# Patient Record
Sex: Female | Born: 1940 | Race: Black or African American | Hispanic: No | Smoking: Former smoker
Health system: Southern US, Community
[De-identification: ages and names within clinical notes are randomized; demographics above are authoritative.]

## PROBLEM LIST (undated history)

## (undated) DIAGNOSIS — F419 Anxiety disorder, unspecified: Secondary | ICD-10-CM

## (undated) DIAGNOSIS — H409 Unspecified glaucoma: Secondary | ICD-10-CM

## (undated) DIAGNOSIS — I272 Pulmonary hypertension, unspecified: Secondary | ICD-10-CM

## (undated) DIAGNOSIS — I270 Primary pulmonary hypertension: Secondary | ICD-10-CM

## (undated) DIAGNOSIS — E785 Hyperlipidemia, unspecified: Secondary | ICD-10-CM

## (undated) DIAGNOSIS — I499 Cardiac arrhythmia, unspecified: Secondary | ICD-10-CM

## (undated) DIAGNOSIS — M349 Systemic sclerosis, unspecified: Secondary | ICD-10-CM

## (undated) DIAGNOSIS — I10 Essential (primary) hypertension: Secondary | ICD-10-CM

## (undated) HISTORY — DX: Unspecified glaucoma: H40.9

## (undated) HISTORY — DX: Hyperlipidemia, unspecified: E78.5

## (undated) HISTORY — PX: VESICOVAGINAL FISTULA CLOSURE W/ TAH: SUR271

## (undated) HISTORY — PX: COLONOSCOPY: SHX174

## (undated) HISTORY — DX: Cardiac arrhythmia, unspecified: I49.9

## (undated) HISTORY — DX: Anxiety disorder, unspecified: F41.9

## (undated) HISTORY — DX: Systemic sclerosis, unspecified: M34.9

## (undated) HISTORY — DX: Pulmonary hypertension, unspecified: I27.20

## (undated) HISTORY — DX: Primary pulmonary hypertension: I27.0

---

## 2000-03-21 ENCOUNTER — Inpatient Hospital Stay (HOSPITAL_COMMUNITY): Admission: EM | Admit: 2000-03-21 | Discharge: 2000-03-23 | Payer: Self-pay | Admitting: Emergency Medicine

## 2000-03-22 ENCOUNTER — Encounter: Payer: Self-pay | Admitting: Geriatric Medicine

## 2000-06-19 ENCOUNTER — Emergency Department (HOSPITAL_COMMUNITY): Admission: EM | Admit: 2000-06-19 | Discharge: 2000-06-20 | Payer: Self-pay | Admitting: *Deleted

## 2000-06-19 ENCOUNTER — Encounter: Payer: Self-pay | Admitting: Emergency Medicine

## 2002-09-08 ENCOUNTER — Encounter (INDEPENDENT_AMBULATORY_CARE_PROVIDER_SITE_OTHER): Payer: Self-pay | Admitting: *Deleted

## 2002-09-08 ENCOUNTER — Ambulatory Visit (HOSPITAL_COMMUNITY): Admission: RE | Admit: 2002-09-08 | Discharge: 2002-09-08 | Payer: Self-pay | Admitting: Gastroenterology

## 2006-02-27 ENCOUNTER — Encounter: Admission: RE | Admit: 2006-02-27 | Discharge: 2006-02-27 | Payer: Self-pay | Admitting: Internal Medicine

## 2006-03-11 ENCOUNTER — Encounter: Admission: RE | Admit: 2006-03-11 | Discharge: 2006-03-11 | Payer: Self-pay | Admitting: Internal Medicine

## 2006-09-14 ENCOUNTER — Ambulatory Visit (HOSPITAL_COMMUNITY): Admission: RE | Admit: 2006-09-14 | Discharge: 2006-09-14 | Payer: Self-pay | Admitting: Internal Medicine

## 2008-11-24 ENCOUNTER — Observation Stay (HOSPITAL_COMMUNITY): Admission: EM | Admit: 2008-11-24 | Discharge: 2008-11-25 | Payer: Self-pay | Admitting: Emergency Medicine

## 2008-12-30 ENCOUNTER — Emergency Department (HOSPITAL_COMMUNITY): Admission: EM | Admit: 2008-12-30 | Discharge: 2008-12-30 | Payer: Self-pay | Admitting: Emergency Medicine

## 2010-07-04 LAB — COMPREHENSIVE METABOLIC PANEL
ALT: 11 U/L (ref 0–35)
AST: 28 U/L (ref 0–37)
Albumin: 4.1 g/dL (ref 3.5–5.2)
Alkaline Phosphatase: 54 U/L (ref 39–117)
BUN: 11 mg/dL (ref 6–23)
CO2: 24 mEq/L (ref 19–32)
Calcium: 9.4 mg/dL (ref 8.4–10.5)
Chloride: 105 mEq/L (ref 96–112)
Creatinine, Ser: 0.63 mg/dL (ref 0.4–1.2)
GFR calc Af Amer: >60 mL/min (ref >60)
GFR calc non Af Amer: >60 mL/min (ref >60)
Glucose, Bld: 153 mg/dL — ABNORMAL HIGH (ref 70–99)
Potassium: 3.1 mEq/L — ABNORMAL LOW (ref 3.5–5.1)
Sodium: 140 mEq/L (ref 135–145)
Total Bilirubin: 1 mg/dL (ref 0.3–1.2)
Total Protein: 7.1 g/dL (ref 6.0–8.3)

## 2010-07-04 LAB — LIPASE, BLOOD: Lipase: 15 U/L (ref 11–59)

## 2010-07-04 LAB — CBC
HCT: 38.9 % (ref 36.0–46.0)
Hemoglobin: 12.8 g/dL (ref 12.0–15.0)
MCHC: 33 g/dL (ref 30.0–36.0)
MCV: 93.6 fL (ref 78.0–100.0)
Platelets: 360 10*3/uL (ref 150–400)
RBC: 4.16 MIL/uL (ref 3.87–5.11)
RDW: 15 % (ref 11.5–15.5)
WBC: 13 10*3/uL — ABNORMAL HIGH (ref 4.0–10.5)

## 2010-07-04 LAB — DIFFERENTIAL
Basophils Absolute: 0.2 10*3/uL — ABNORMAL HIGH (ref 0.0–0.1)
Basophils Relative: 1 % (ref 0–1)
Eosinophils Absolute: 0 10*3/uL (ref 0.0–0.7)
Eosinophils Relative: 0 % (ref 0–5)
Lymphocytes Relative: 11 % — ABNORMAL LOW (ref 12–46)
Lymphs Abs: 1.4 10*3/uL (ref 0.7–4.0)
Monocytes Absolute: 0.6 10*3/uL (ref 0.1–1.0)
Monocytes Relative: 4 % (ref 3–12)
Neutro Abs: 10.8 10*3/uL — ABNORMAL HIGH (ref 1.7–7.7)
Neutrophils Relative %: 83 % — ABNORMAL HIGH (ref 43–77)

## 2010-07-06 LAB — COMPREHENSIVE METABOLIC PANEL
ALT: 12 U/L (ref 0–35)
ALT: 16 U/L (ref 0–35)
AST: 18 U/L (ref 0–37)
AST: 30 U/L (ref 0–37)
Albumin: 3.1 g/dL — ABNORMAL LOW (ref 3.5–5.2)
Albumin: 4.7 g/dL (ref 3.5–5.2)
Alkaline Phosphatase: 37 U/L — ABNORMAL LOW (ref 39–117)
Alkaline Phosphatase: 59 U/L (ref 39–117)
BUN: 10 mg/dL (ref 6–23)
BUN: 32 mg/dL — ABNORMAL HIGH (ref 6–23)
CO2: 24 mEq/L (ref 19–32)
CO2: 26 mEq/L (ref 19–32)
Calcium: 10.1 mg/dL (ref 8.4–10.5)
Calcium: 8.6 mg/dL (ref 8.4–10.5)
Chloride: 108 mEq/L (ref 96–112)
Chloride: 99 mEq/L (ref 96–112)
Creatinine, Ser: 0.87 mg/dL (ref 0.4–1.2)
Creatinine, Ser: 1.48 mg/dL — ABNORMAL HIGH (ref 0.4–1.2)
GFR calc Af Amer: 43 mL/min — ABNORMAL LOW (ref >60)
GFR calc Af Amer: >60 mL/min (ref >60)
GFR calc non Af Amer: 35 mL/min — ABNORMAL LOW (ref >60)
GFR calc non Af Amer: >60 mL/min (ref >60)
Glucose, Bld: 108 mg/dL — ABNORMAL HIGH (ref 70–99)
Glucose, Bld: 82 mg/dL (ref 70–99)
Potassium: 3.1 mEq/L — ABNORMAL LOW (ref 3.5–5.1)
Potassium: 3.5 mEq/L (ref 3.5–5.1)
Sodium: 138 mEq/L (ref 135–145)
Sodium: 140 mEq/L (ref 135–145)
Total Bilirubin: 0.6 mg/dL (ref 0.3–1.2)
Total Bilirubin: 0.8 mg/dL (ref 0.3–1.2)
Total Protein: 5.7 g/dL — ABNORMAL LOW (ref 6.0–8.3)
Total Protein: 8.2 g/dL (ref 6.0–8.3)

## 2010-07-06 LAB — CBC
HCT: 32.1 % — ABNORMAL LOW (ref 36.0–46.0)
HCT: 41.9 % (ref 36.0–46.0)
Hemoglobin: 10.6 g/dL — ABNORMAL LOW (ref 12.0–15.0)
Hemoglobin: 13.8 g/dL (ref 12.0–15.0)
MCHC: 33 g/dL (ref 30.0–36.0)
MCHC: 33.2 g/dL (ref 30.0–36.0)
MCV: 92.8 fL (ref 78.0–100.0)
MCV: 93.5 fL (ref 78.0–100.0)
Platelets: 260 10*3/uL (ref 150–400)
Platelets: 334 10*3/uL (ref 150–400)
RBC: 3.44 MIL/uL — ABNORMAL LOW (ref 3.87–5.11)
RBC: 4.51 MIL/uL (ref 3.87–5.11)
RDW: 13.8 % (ref 11.5–15.5)
RDW: 14 % (ref 11.5–15.5)
WBC: 3.8 10*3/uL — ABNORMAL LOW (ref 4.0–10.5)
WBC: 5.8 10*3/uL (ref 4.0–10.5)

## 2010-07-06 LAB — DIFFERENTIAL
Basophils Absolute: 0 10*3/uL (ref 0.0–0.1)
Basophils Relative: 1 % (ref 0–1)
Eosinophils Absolute: 0 10*3/uL (ref 0.0–0.7)
Eosinophils Relative: 0 % (ref 0–5)
Lymphocytes Relative: 26 % (ref 12–46)
Lymphs Abs: 1.5 10*3/uL (ref 0.7–4.0)
Monocytes Absolute: 0.2 10*3/uL (ref 0.1–1.0)
Monocytes Relative: 4 % (ref 3–12)
Neutro Abs: 4.1 10*3/uL (ref 1.7–7.7)
Neutrophils Relative %: 70 % (ref 43–77)

## 2010-07-06 LAB — BASIC METABOLIC PANEL
BUN: 23 mg/dL (ref 6–23)
CO2: 22 mEq/L (ref 19–32)
Calcium: 8.6 mg/dL (ref 8.4–10.5)
Chloride: 104 mEq/L (ref 96–112)
Creatinine, Ser: 0.97 mg/dL (ref 0.4–1.2)
GFR calc Af Amer: >60 mL/min (ref >60)
GFR calc non Af Amer: 57 mL/min — ABNORMAL LOW (ref >60)
Glucose, Bld: 111 mg/dL — ABNORMAL HIGH (ref 70–99)
Potassium: 3.7 mEq/L (ref 3.5–5.1)
Sodium: 137 mEq/L (ref 135–145)

## 2010-07-06 LAB — URINALYSIS, ROUTINE W REFLEX MICROSCOPIC
Bilirubin Urine: NEGATIVE
Glucose, UA: NEGATIVE mg/dL
Ketones, ur: 40 mg/dL — AB
Nitrite: NEGATIVE
Protein, ur: NEGATIVE mg/dL
Specific Gravity, Urine: 1.019 (ref 1.005–1.030)
Urobilinogen, UA: 1 mg/dL (ref 0.0–1.0)
pH: 5.5 (ref 5.0–8.0)

## 2010-07-06 LAB — URINE MICROSCOPIC-ADD ON

## 2010-07-06 LAB — URINE CULTURE: Colony Count: >=100000

## 2010-07-06 LAB — LACTIC ACID, PLASMA
Lactic Acid, Venous: 0.6 mmol/L (ref 0.5–2.2)
Lactic Acid, Venous: 4.8 mmol/L — ABNORMAL HIGH (ref 0.5–2.2)

## 2010-07-06 LAB — LIPASE, BLOOD: Lipase: 22 U/L (ref 11–59)

## 2010-08-13 NOTE — H&P (Signed)
NAMEKHILYNN, BORNTREGER NO.:  [id number]   MEDICAL RECORD NO.:  [id number]          PATIENT TYPE:  EMS   LOCATION:  MAJO                         FACILITY:  [facility]   PHYSICIAN:  Jude Enid Baas, MD          DATE OF BIRTH:  05/14/1940   DATE OF ADMISSION:  11/24/2008  DATE OF DISCHARGE:                              HISTORY & PHYSICAL   PRIMARY CARE PHYSICIAN:  Merlene Laughter. Renae Gloss, MD   CHIEF COMPLAINT:  Intractable abdominal pain, nausea, and vomiting.   HISTORY OF PRESENTING COMPLAINT:  The patient is a 70 year old with  history of hypertension and diverticulitis who presented to the ED with  complaints of intractable abdominal pain, nausea, vomiting, and diarrhea  x4 days.  Described the pain as colicky and crampy in nature.  Severity  of about 9/10, down to a 6/10 at this time.  The patient has been having  associated nausea, vomiting, and diarrhea, but denies any blood in the  stool.  The patient was evaluated in the ED.  Initial workup done  included lactic acid level which was elevated at 4.8.  The patient was  placed on IV fluid rehydration and repeat lactic acid level was down to  0.6.  Abdominal x-ray done was essentially unremarkable.  However,  urinalysis done was suggestive of a urinary tract infection.  The  patient has been given pain medication with Dilaudid and still continues  to have some abdominal pain.  Being admitted for further evaluation and  management.  Lipase level was also checked which was within normal  limits.   PAST MEDICAL HISTORY:  Significant for:  1. Hypertension.  2. History of diverticulitis in the past.   ALLERGIES:  The patient has no known drug allergies.   OUTPATIENT MEDICATION:  Lisinopril, hydrochlorothiazide 1 tablet p.o.  daily, and Phenergan as needed.   FAMILY HISTORY:  The patient lives with daughter at this time.  Family  history is significant for diabetes in the mom and brother, also son  diagnosed recently with  testicular cancer.  The patient denies any  history of smoking, alcohol, or IV drug use.   REVIEW OF SYSTEMS:  GENERAL:  No fevers, weight loss, or loss of  appetite.  RESPIRATORY:  No shortness of breath or cough productive of  sputum.  CVS:  No chest pain or palpitation.  GI:  Positive for nausea,  vomiting, diarrhea, and abdominal pain.  ENDOCRINE:  No polyuria,  polydipsia, or polyphagia.  GENITOURINARY:  No dysuria, frequency, or  urgency.  HEM:  No easy skin bruising or epistaxis.  PSYCH:  No  depression.  NEURO:  No headache, seizures, or syncope.   PHYSICAL EXAMINATION:  VITAL SIGNS:  On presentation, blood pressure was  151/82, pulse rate of 67, respiratory rate of 24, temperature 98.0, and  O2 sat of 95%.  GENERAL:  Found a middle-aged African American lady, currently in mild-  to-moderate distress from abdominal pain.  HEENT:  Pupils are equal, round, and reactive to light and accommodation  bilaterally.  Extraocular muscles are intact.  NECK:  Supple.  No JVD.  No thyroid enlargement.  RESPIRATORY:  Chest is clear to auscultation bilaterally.  CVS:  Heart sounds 1 and 2.  Regular rate and rhythm.  No murmurs or  gallops.  ABDOMEN:  Flat with mild diffuse tenderness mostly in the suprapubic  region.  No rebound or guarding.  Bowel sounds positive in all  quadrants.  EXTREMITIES:  No cyanosis, clubbing, or edema.  Pedal pulses palpable  bilaterally.  NEURO:  The patient is awake, alert, and oriented to time, place, and  person.  No focal findings noted at this time.   LABORATORY DATA:  CBC with white count of 5.8, hemoglobin 13.8,  hematocrit 41.9, and platelet of 334.  BMP with sodium of 140, potassium  3.1, chloride 99, bicarb 26, BUN 32, creatinine 1.48, and glucose of  108.  Calcium 10.1.  LFTs essentially unremarkable.  Lipase 22.  Lactic  acid 4.8 initially, but down to 0.6 on repeat.  Urinalysis suggestive of  urinary tract infection.   Abdominal x-ray was  essentially unremarkable.  No free air was noted.   ASSESSMENT:  1. Urinary tract infection.  2. Intractable abdominal pain, nausea, and vomiting, unclear etiology.  3. Hypokalemia.  4. History of hypertension and diverticulitis.   PLAN:  1. We will admit to inpatient.  2. We will get CT scan of the abdomen and pelvis for further      evaluation of this persistent abdominal pain.  3. We will place on IV ceftriaxone for treatment of urinary tract      infection, pending the results of culture and sensitivity.  4. We will place on p.r.n. pain control with morphine, Zofran p.r.n.      for nausea, and IV fluid for rehydration.  5. We will consider GI consult in the morning if there is persistence      of the symptoms despite ongoing measures as stated above.  6. Prophylaxis.  The patient has been placed on Lovenox and Protonix.  7. We will hold off on outpatient dose of hydrochlorothiazide and      lisinopril due to ongoing nausea and vomiting as the patient      appears to be dehydrated at this time.  We will place on amlodipine      10 mg p.o. daily with whole parameters for blood pressure.   Case and plan discussed extensively with the patient.  She voiced  understanding and she is in agreement with the plan of care.      Jude Enid Baas, MD  Electronically Signed     JO/MEDQ  D:  11/24/2008  T:  11/24/2008  Job:  [id number]

## 2010-08-13 NOTE — Discharge Summary (Signed)
Nancy Lambert, Nancy Lambert NO.:  [id number]   MEDICAL RECORD NO.:  [id number]          PATIENT TYPE:  INP   LOCATION:  6729                         FACILITY:  [facility]   PHYSICIAN:  Eduard Clos, MDDATE OF BIRTH:  Jun 26, 1940   DATE OF ADMISSION:  11/23/2008  DATE OF DISCHARGE:  11/25/2008                               DISCHARGE SUMMARY   PRIMARY CARE PHYSICIAN:  Merlene Laughter. Renae Gloss, MD   HOSPITAL COURSE:  A 70 year old female with a history of hypertension  and previous diverticulitis presented with complaint of abdominal pain,  nausea, and vomiting.  On admission, the patient had a CAT scan of  abdomen and pelvis, which did not show any acute.  Elevated lactic acid  was found, which was subsequently repeated and was found to be 0.6,  within normal range.  UA was compatible with possible UTI at this time  and cultures are only showing multiple morphotypes more than 100,000  colonies.  The patient at this time has improved symptom wise, she is  able to tolerate diet, had an enema which had produced some results.  In  addition, the patient also had developed some swelling in the right  hand, both proximal and distal interphalangeal joints, which the patient  states that she gets recurrently.  Possible gout is entertained at this  time.  We will be giving Solu-Medrol one dose IV 40 mg and after that  the patient will be discharged home.  Due to possibility of gout her  lisinopril/hydrochlorothiazide will be discontinued at this time.  During this stay, the patient was on amlodipine, will be continued.  The  patient advised to follow up with her primary care physician within a  week's time and further workup on possible gout is to be carried on,  which the patient agrees.   The patient also was bit concerned about her son's health who was  diagnosed with cancer, had expressed feelings about it, but did not have  any suicidal ideation, and will be starting on  fluoxetine for the  patient's depression, and will be given a script for that and further  followup with the primary care for the same.  At this time of this  dictation, the patient is hemodynamically stable.   PROCEDURES DONE:  During this stay,  1. Acute abdominal series on November 24, 2008, shows unremarkable bowel      gas pattern.  No free intra-abdominal air seen.  2. CT of abdomen and pelvis on November 24, 2008, shows nothing acute.   PERTINENT LABORATORY DATA:  The patient's hemoglobin and hematocrit on  discharge was 10.6 and 32.1, WBC was 3.8.  Creatinine was 0.8, which  improved from 1.4 with hydration, lipase was 22.  UA showed 7-10 wbc and  leukocytes was positive, nitrites negative.  Urine culture shows  multiple morphotypes more than 100,000 colonies   FINAL DIAGNOSES:  1. Abdominal pain, possibly from urinary tract infection.  2. Constipation, improved with enema.  3. 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  4. Depression with no suicidal ideation.  5. Hypertension.   MEDICATIONS:  At discharge,  1. Norvasc 10 mg daily.  2. Fluoxetine 20 mg daily.   PLAN:  1. The patient advised to follow up with the primary care physician      within a week's time, recheck BMET and CBC.  At that time, also the      patient needs to check the uric acid level and further work up on      arthritis.  2. The patient also needs to have further followup on her anemia.  3. The patient also needs to follow with her primary care for      addressing her depression.  At present, has no suicidal ideation.  4. The patient is to be on a cardiac healthy diet.      Eduard Clos, MD  Electronically Signed     ANK/MEDQ  D:  11/25/2008  T:  11/26/2008  Job:  [id number]   cc:   Merlene Laughter. Renae Gloss, M.D.

## 2010-08-16 NOTE — H&P (Signed)
[facility]  Patient:    Nancy Lambert, Nancy Lambert                      MRN: [id number] Adm. Date:  [id number] Attending:  Kae Heller                         History and Physical  CHIEF COMPLAINT:  Nausea, vomiting, and diarrhea.  HISTORY OF PRESENT ILLNESS:  Nancy Lambert is a 70 year old female, who has no significant past medical history, had remotely been followed at [facility].  She is admitted as an unassigned patient for intractable nausea, vomiting, and diarrhea today.  She reports she had some symptoms with constipation the previous week, and took two laxatives last night.  She reports that several of her co-workers have had similar symptoms. The nausea and vomiting started this afternoon.  She does not report any abdominal pain at present.  She has had no blood in her stool.   She has no urinary symptoms.  REVIEW OF SYSTEMS:  She denies chest pain or palpitations.  No shortness of breath.  No chronic coughing and no musculoskeletal symptoms.  No skin rash of note.  No recent weight gain or weight loss.  No night sweats, fevers or chills.  No change in vision or hearing.  PAST MEDICAL HISTORY:  None.  PAST SURGICAL HISTORY:  Includes remote hysterectomy.  CURRENT MEDICATIONS:  None.  ALLERGIES:  None.  SOCIAL HISTORY:  The patient is divorced.  She works in Heritage manager at [employer].  She does not smoke nor drink alcohol.  FAMILY HISTORY:  Mother is alive in her 81s.  No medical problems.  Father died in his 70s from drowning.  She has five sisters and two brothers in which she does not report any significant medical problems.  PHYSICAL EXAMINATION:  VITAL SIGNS:  Blood pressure elevated at 185/89, heart rate at 63, respiratory rate 20, temperature is 97.1.  HEENT:  Tympanic membranes are normal.  Nasal mucosa and oral mucosa moist.  LUNGS:  Clear.  HEART:  Regular rate and rhythm  without murmurs, rubs, or bruits.  ABDOMEN:  Soft, nontender, increased bowel sounds.  RECTAL:  Watery brown stool.  No other abnormality.  EXTREMITIES:  No clubbing, cyanosis, or edema.  LABORATORY DATA:  Lipase is normal at 27, amylase slightly elevated in the 180s.  Basic metabolic panel normal except for a low potassium at 2.8.  CBC is normal with a hemoglobin of 12.4 and a white count of 9.0.  LFTs are within normal limits.  Plain film of the abdomen does not show any significant abnormality.  Chest x-ray within normal limits.  ASSESSMENT AND PLAN: 1. Patient with probable viral gastroenteritis with intractable nausea,    vomiting, and diarrhea.  She had an nasogastric tube placed in the    emergency department.  I will discontinue that after about four hours if    nausea is resolved.  Continue to treat her with Phenergan. 2. Hypokalemia, due to #1.  We will replete this with intravenous fluids. 3. Hopefully discharge the patient in the morning if she is feeling well    with no nausea. 4. Patient with elevated amylase, most likely secondary to acute illness.    Her lipase is within normal limits. DD:  03/21/00 TD:  03/22/00 Job: [id number]

## 2010-08-16 NOTE — Op Note (Signed)
   NAME:  EMREY, THORNLEY NO.:  [id number]   MEDICAL RECORD NO.:  [id number]                   PATIENT TYPE:  AMB   LOCATION:  ENDO                                 FACILITY:  [facility]   PHYSICIAN:  Danise Edge, M.D.                DATE OF BIRTH:  September 18, 1940   DATE OF PROCEDURE:  09/08/2002  DATE OF DISCHARGE:                                 OPERATIVE REPORT   INDICATIONS FOR PROCEDURE:  The patient is a 70 year old female born [birth year]. She is undergoing diagnostic colonoscopy to evaluate intermittent  painless hematochezia associated with constipated bowel movements.   ENDOSCOPIST:  Danise Edge, M.D.   PREMEDICATIONS:  7.5mg  Versed and 50mg  Demerol.   PROCEDURE:  After obtaining informed consent, the patient was placed in the  left lateral decubitus position. I administered intravenous  Demerol and  intravenous Versed to achieve conscious sedation for the procedure. The  patient's blood pressure, oxygen saturation and cardiac rhythm are monitored  throughout the procedure and documented in the medical record.   Anal inspection was normal. Digital rectal examination was normal. The  Olympus pediatric colonoscope was introduced into the rectum and advanced to  the cecum. Colonic preparation for the examination today was excellent.   Rectum; from the proximal rectum at 15 cm from the anal verge a 1 cm  pedunculated polyp was removed with the electrocautery snare.   The sigmoid colon and descending colon was normal.   Splenic flexure normal.   Transverse colon normal.   Hepatic flexure normal.   Ascending colon normal.   Cecum and ileocecal valve normal.   ASSESSMENT:  Proctocolonoscopy to the cecum reveals a 1 cm pedunculated  polyp in the proximal rectum which was removed with electrocautery snare.                                               Danise Edge, M.D.    MJ/MEDQ  D:  09/08/2002  T:  09/08/2002  Job:  [id number]

## 2013-01-22 ENCOUNTER — Emergency Department (HOSPITAL_BASED_OUTPATIENT_CLINIC_OR_DEPARTMENT_OTHER)
Admission: EM | Admit: 2013-01-22 | Discharge: 2013-01-22 | Disposition: A | Discharge status: Home / Self Care | Payer: No Typology Code available for payment source | Attending: Emergency Medicine | Admitting: Emergency Medicine

## 2013-01-22 ENCOUNTER — Emergency Department (HOSPITAL_BASED_OUTPATIENT_CLINIC_OR_DEPARTMENT_OTHER): Payer: No Typology Code available for payment source

## 2013-01-22 ENCOUNTER — Encounter (HOSPITAL_BASED_OUTPATIENT_CLINIC_OR_DEPARTMENT_OTHER): Payer: Self-pay | Admitting: Emergency Medicine

## 2013-01-22 DIAGNOSIS — Y9389 Activity, other specified: Secondary | ICD-10-CM | POA: Insufficient documentation

## 2013-01-22 DIAGNOSIS — Z79899 Other long term (current) drug therapy: Secondary | ICD-10-CM | POA: Insufficient documentation

## 2013-01-22 DIAGNOSIS — IMO0002 Reserved for concepts with insufficient information to code with codable children: Secondary | ICD-10-CM | POA: Insufficient documentation

## 2013-01-22 DIAGNOSIS — I10 Essential (primary) hypertension: Secondary | ICD-10-CM | POA: Insufficient documentation

## 2013-01-22 DIAGNOSIS — Y92410 Unspecified street and highway as the place of occurrence of the external cause: Secondary | ICD-10-CM | POA: Insufficient documentation

## 2013-01-22 DIAGNOSIS — S0993XA Unspecified injury of face, initial encounter: Secondary | ICD-10-CM | POA: Insufficient documentation

## 2013-01-22 HISTORY — DX: Essential (primary) hypertension: I10
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
## 2013-04-15 ENCOUNTER — Other Ambulatory Visit: Payer: Self-pay

## 2013-04-15 DIAGNOSIS — Z1231 Encounter for screening mammogram for malignant neoplasm of breast: Secondary | ICD-10-CM

## 2013-05-02 ENCOUNTER — Ambulatory Visit
Admission: RE | Admit: 2013-05-02 | Discharge: 2013-05-02 | Disposition: A | Discharge status: Home / Self Care | Payer: Medicare Other | Source: Ambulatory Visit

## 2013-05-02 DIAGNOSIS — Z1231 Encounter for screening mammogram for malignant neoplasm of breast: Secondary | ICD-10-CM

## 2014-10-23 ENCOUNTER — Other Ambulatory Visit: Payer: Self-pay

## 2014-10-23 DIAGNOSIS — Z1231 Encounter for screening mammogram for malignant neoplasm of breast: Secondary | ICD-10-CM

## 2014-10-30 ENCOUNTER — Ambulatory Visit
Admission: RE | Admit: 2014-10-30 | Discharge: 2014-10-30 | Disposition: A | Discharge status: Home / Self Care | Payer: Medicare HMO | Source: Ambulatory Visit

## 2014-10-30 DIAGNOSIS — Z1231 Encounter for screening mammogram for malignant neoplasm of breast: Secondary | ICD-10-CM

## 2015-02-05 ENCOUNTER — Other Ambulatory Visit (HOSPITAL_COMMUNITY): Payer: Self-pay | Admitting: Respiratory Therapy

## 2015-02-05 DIAGNOSIS — M791 Myalgia, unspecified site: Secondary | ICD-10-CM

## 2015-02-16 ENCOUNTER — Ambulatory Visit (HOSPITAL_COMMUNITY)
Admission: RE | Admit: 2015-02-16 | Discharge: 2015-02-16 | Disposition: A | Discharge status: Home / Self Care | Payer: Medicare HMO | Source: Ambulatory Visit | Attending: Rheumatology | Admitting: Rheumatology

## 2015-02-16 DIAGNOSIS — M791 Myalgia: Secondary | ICD-10-CM | POA: Insufficient documentation

## 2015-02-16 LAB — PULMONARY FUNCTION TEST
DL/VA % pred: 100 %
DL/VA: 4.68 ml/min/mmHg/L
DLCO unc % pred: 70 %
DLCO unc: 16.17 ml/min/mmHg
FEF 25-75 Pre: 2 L/sec
FEF2575-%Pred-Pre: 129 %
FEV1-%Pred-Pre: 101 %
FEV1-Pre: 1.72 L
FEV1FVC-%Pred-Pre: 107 %
FEV6-%Pred-Pre: 98 %
FEV6-Pre: 2.07 L
FEV6FVC-%Pred-Pre: 104 %
FVC-%Pred-Pre: 94 %
FVC-Pre: 2.07 L
Pre FEV1/FVC ratio: 83 %
Pre FEV6/FVC Ratio: 100 %
RV % pred: 73 %
RV: 1.61 L
TLC % pred: 73 %
TLC: 3.62 L

## 2015-05-10 ENCOUNTER — Other Ambulatory Visit: Payer: Self-pay | Admitting: Rheumatology

## 2015-05-10 DIAGNOSIS — M25511 Pain in right shoulder: Secondary | ICD-10-CM

## 2015-05-28 ENCOUNTER — Encounter: Payer: Self-pay | Admitting: Internal Medicine

## 2015-05-28 ENCOUNTER — Ambulatory Visit
Admission: RE | Admit: 2015-05-28 | Discharge: 2015-05-28 | Disposition: A | Discharge status: Home / Self Care | Payer: Medicare HMO | Source: Ambulatory Visit | Attending: Rheumatology | Admitting: Rheumatology

## 2015-05-28 ENCOUNTER — Ambulatory Visit (INDEPENDENT_AMBULATORY_CARE_PROVIDER_SITE_OTHER): Payer: Medicare HMO | Admitting: Internal Medicine

## 2015-05-28 VITALS — BP 122/70 | HR 60 | Ht 64.0 in | Wt 162.0 lb

## 2015-05-28 DIAGNOSIS — R0600 Dyspnea, unspecified: Secondary | ICD-10-CM | POA: Insufficient documentation

## 2015-05-28 DIAGNOSIS — M25511 Pain in right shoulder: Secondary | ICD-10-CM

## 2015-05-28 DIAGNOSIS — R0609 Other forms of dyspnea: Secondary | ICD-10-CM

## 2015-05-28 MED ORDER — GADOBENATE DIMEGLUMINE 529 MG/ML IV SOLN
14.0000 mL | Freq: Once | INTRAVENOUS | Status: AC | PRN
  Administered 2015-05-28: 14 mL via INTRAVENOUS

## 2015-05-28 NOTE — Progress Notes (Signed)
   Subjective:    Patient ID: Nancy Lambert, female    DOB: 01-13-1941,    MRN: [id number]  HPI  86 yobf mild smoking hx / quit in her 71's referred to pulmonary clinic 05/28/2015 by Dr Titus Mould with dx of limited scleroderma and cardiomegaly being eval by Dr Nadyne Coombes also     05/28/2015 1st [facility] office visit/ Nancy Lambert   Chief Complaint  Patient presents with  . Pulmonary Consult    Referred by Dr. Dossie Der. Pt c/o SOB for over 10 yrs. She gets SOB walking up stairs. She also notices SOB sometimes when lying down. She has palpitations when SOB occurs.   at home has to go up same steps x 12 years and noted 10 years ago slt reduction tolerance but never has to stop and really no progression of doe/ no problem with flat surfaces at nl pace  Sleep disrupted  by overt GERD/ on prilosec qd and nothing at hs in terms of gerd rx   No obvious   day to day or daytime variabilty or assoc chronic cough or cp or chest tightness, subjective wheeze overt sinus or hb symptoms. No unusual exp hx or h/o childhood pna/ asthma or knowledge of premature birth.   Also denies any obvious fluctuation of symptoms with weather or environmental changes or other aggravating or alleviating factors except as outlined above   Current Medications, Allergies, Complete Past Medical History, Past Surgical History, Family History, and Social History were reviewed in [employer] record.             Review of Systems  Constitutional: Negative for fever, chills and unexpected weight change.  HENT: Negative for congestion, dental problem, ear pain, nosebleeds, postnasal drip, rhinorrhea, sinus pressure, sneezing, sore throat, trouble swallowing and voice change.   Eyes: Negative for visual disturbance.  Respiratory: Positive for shortness of breath. Negative for cough and choking.   Cardiovascular: Positive for leg swelling. Negative for chest pain.  Gastrointestinal: Negative for vomiting, abdominal  pain and diarrhea.       Acid heartburn  Indigestion  Genitourinary: Negative for difficulty urinating.  Musculoskeletal: Negative for arthralgias.  Skin: Positive for rash.  Neurological: Negative for tremors, syncope and headaches.  Hematological: Does not bruise/bleed easily.       Objective:   Physical Exam  amb bf nad  HEENT: nl dentition, turbinates, and oropharynx. Nl external ear canals without cough reflex   NECK :  without JVD/Nodes/TM/ nl carotid upstrokes bilaterally   LUNGS: no acc muscle use,  Nl contour chest which is clear to A and P bilaterally without cough on insp or exp maneuvers   CV:  RRR  no s3 or murmur  ?  increase in P2, no edema   ABD:  soft and nontender with nl inspiratory excursion in the supine position. No bruits or organomegaly, bowel sounds nl  MS:  Nl gait/ ext warm without deformities, calf tenderness, cyanosis or clubbing No obvious joint restrictions   SKIN: warm and dry without lesions  Mild/moderate bilateral  sclerodactyly   NEURO:  alert, approp, nl sensorium with  no motor deficits   cxr report 02/02/15 severe cardiomegaly s chf        Assessment & Plan:

## 2015-05-28 NOTE — Assessment & Plan Note (Signed)
pfts 01/2015  VC  92% s obst,  DLCO 70/ corrects  to 100% for alv  vol  - 05/28/2015  Walked RA x 3 laps @ 185 ft each stopped due end of study, nl pace/   sats 97% no sob   No evidence of scleroderma ILD but she does have overt poorly controlled GERD which puts her at risk of noct aspiration and may have Coco - w/u for the latter in progress at cards  There has been no obvious progression of doe at rate and pfts look good so rec for now max gerd rx and complete her cards w/u  F/u q November with cxr's then and pfts/walking sats  I had an extended discussion with the patient reviewing all relevant studies completed to date and  lasting 35 minutes of a 46minute visit    Each maintenance medication was reviewed in detail including most importantly the difference between maintenance and prns and under what circumstances the prns are to be triggered using an action plan format that is not reflected in the computer generated alphabetically organized AVS.    Please see instructions for details which were reviewed in writing and the patient given a copy highlighting the part that I personally wrote and discussed at today's ov.

## 2015-05-28 NOTE — Patient Instructions (Signed)
Add pepcid 20 mg at bedtime to the prilosec which you should take Take 30-60 min before first meal of the day   GERD (REFLUX)  is an extremely common cause of respiratory symptoms just like yours , many times with no obvious heartburn at all.    It can be treated with medication, but also with lifestyle changes including elevation of the head of your bed (ideally with 6 inch  bed blocks),  Smoking cessation, avoidance of late meals, excessive alcohol, and avoid fatty foods, chocolate, peppermint, colas, red wine, and acidic juices such as orange juice.  NO MINT OR MENTHOL PRODUCTS SO NO COUGH DROPS  USE SUGARLESS CANDY INSTEAD (Jolley ranchers or Stover's or Life Savers) or even ice chips will also do - the key is to swallow to prevent all throat clearing. NO OIL BASED VITAMINS - use powdered substitutes.  Be sure to Ask Dr Nadyne Coombes to send me his report on your heart     Return in Nov 2017 for office visit with pfts - call in meantime if needed

## 2015-05-30 ENCOUNTER — Other Ambulatory Visit: Payer: Self-pay | Admitting: Cardiology

## 2015-05-30 DIAGNOSIS — R160 Hepatomegaly, not elsewhere classified: Secondary | ICD-10-CM

## 2015-06-20 ENCOUNTER — Ambulatory Visit: Payer: Medicare HMO | Admitting: Podiatry

## 2015-06-22 ENCOUNTER — Ambulatory Visit
Admission: RE | Admit: 2015-06-22 | Discharge: 2015-06-22 | Disposition: A | Discharge status: Home / Self Care | Payer: Medicare HMO | Source: Ambulatory Visit | Attending: Cardiology | Admitting: Cardiology

## 2015-06-22 DIAGNOSIS — R160 Hepatomegaly, not elsewhere classified: Secondary | ICD-10-CM

## 2015-07-23 DIAGNOSIS — I272 Pulmonary hypertension, unspecified: Secondary | ICD-10-CM | POA: Diagnosis present

## 2015-07-23 NOTE — H&P (Signed)
OFFICE VISIT NOTES COPIED TO EPIC FOR DOCUMENTATION  . History of Present Illness Nancy Lambert AGNP-C; Jul 07, 2015 3:34 PM) The patient is a 75 year old female who presents for a Follow-up for Primary pulmonary hypertension. She has a history of hyperlipidemia and hypertension. She states she has been seen by dermatologist for years for skin thickening and abnormal discoloration. Due to development of Raynaud's and sclerodactyly, she was referred to rheumatology for further evaluation and diagnosed with scleroderma. She had also mentioned worsening shortness of breath over the past several months to a year and was referred here for an echocardiogram which was performed on 02/20/2015 revealing mild to moderate pulmonary hypertension with suggestion of increased RV pressures. She was referred for consultation for further evaluation and management. She denies any chest pain, PND, orthopnea, edema, dizziness, or syncope. She reports intermittent palpitations, worse at night when laying flat that resolves with change in positions. No history of diabetes or thyroid disease.   Problem List/Past Medical (April Garrison; 07/07/2015 2:36 PM) Idiopathic PAH (pulmonary arterial hypertension) (I27.0)  Echocardiogram 02/20/2015: 1. Left ventricle cavity is normal in size. Moderate concentric hypertrophy of the left ventricle. Normal global wall motion. Normal diastolic filling pattern. Calculated EF 70%. 2. Left atrial cavity is moderately dilated. 3. Trace aortic regurgitation. 4. Mild to moderate tricuspid regurgitation. Mild to moderate pulmonary hypertension. Pulmonary artery systolic pressure is estimated at 40 mm Hg. 5. Moderate pericardial effusion with clear fluids. There does not appear to be tamponade by 2-D exam. 6. IVC is dilated with respiratory variation, suggests elevated right heart pressure. Consider further f/u. Scleroderma (M34.9)  Followed by Dr. Dossie Der Benign essential hypertension (I10)   Shortness of breath on exertion (R06.02)  Lexiscan myoview stress test 06/18/2015: 1. Resting EKG demonstrates normal sinus rhythm, left axis deviation, cannot exclude inferior infarct old, Anterior infarct old. Stress EKG was negative for myocardial ischemia. Patient exercised for 4 minutes and 5 seconds and achieved 5.93 Mets. There are frequent PVCs in the recovery phase of the stress test. Stress terminated due to achieving target heart rate, 86% of MPHR. Stress symptoms included dyspnea. 2. Myocardial perfusion imaging is normal. Overall left ventricular systolic function was normal without regional wall motion abnormalities. The left ventricular ejection fraction was 62%. Hyperlipidemia, group A (E78.00)  Palpitations (R00.2)  Hepatomegaly (R16.0)  Right upper quadrant abdomen ultrasound 06/22/2015: Echogenic liver without lesion. This likely represents fatty infiltration. Vitamin D deficiency (E55.9)   Allergies (April Garrison; 07/07/2015 2:36 PM) No Known Drug Allergies 05/10/2015  Family History (April Garrison; 07-07-2015 2:36 PM) Mother  Deceased. at age 40 from breast cancer; no heart attacks, light strokes throughout her lifetime; no other cardiovascular conditions Father  Deceased. in his 39s from drowning; no heart attacks or strokes, no known cardiovascular conditions Brother 1  Deceased. at age 81 from stroke complications; 11 yrs younger; no other known cardiovascular conditions Siblings  Patient has a total of 7 siblings, one of whom is deceased from cardiovascular event and has been listed. One sister also has a cardiovascular condition that is also listed. All others are stables with no cardiovascular conditions Sister 1  In stable health. 10 yrs younger; several mini strokes and presently under the care of a physician.  Social History (April Garrison; July 07, 2015 2:36 PM) Marital status  Widowed. Number of Children  4. Alcohol Use  Occasional alcohol  use. Current tobacco use  Never smoker. Living Situation  Lives with relatives. lives with daughter  Past Surgical History (April Garrison; 2015/07/07  2:36 PM) Hysterectomy; Total 1970  Medication History (April Garrison; 07-25-2015 2:43 PM) AmLODIPine Besylate (10MG Tablet, 1 Oral daily) Active. Gabapentin (300MG Capsule, 1 Oral two times daily) Active. Omeprazole (20MG Capsule DR, 1 Oral daily) Active. Medications Reconciled  Diagnostic Studies History (April Garrison; 25-Jul-2015 2:36 PM) Nuclear stress test 06/18/2015 1. Resting EKG demonstrates normal sinus rhythm, left axis deviation, cannot exclude inferior infarct old, Anterior infarct old. Stress EKG was negative for myocardial ischemia. Patient exercised for 4 minutes and 5 seconds and achieved 5.93 Mets. There are frequent PVCs in the recovery phase of the stress test. Stress terminated due to achieving target heart rate, 86% of MPHR. Stress symptoms included dyspnea. 2. Myocardial perfusion imaging is normal. Overall left ventricular systolic function was normal without regional wall motion abnormalities. The left ventricular ejection fraction was 62%. Labwork  06/18/2015: Total cholesterol 225, triglycerides 130, HDL 62, LDL 137, TSH 5.06 02/02/2015: CRP normal, CBC normal, creatinine 0.8, potassium 4.0, CMP normal, CK normal, sedimentation rate normal, ANA positive at 1:320 Endoscopy 2007 Colonoscopy 2014 Chest X-ray 02/02/2015 Severe cardiomegaly. No evidence of congestive heart failure. Echocardiogram 02/20/2015 1. Left ventricle cavity is normal in size. Moderate concentric hypertrophy of the left ventricle. Normal global wall motion. Normal diastolic filling pattern. Calculated EF 70%. 2. Left atrial cavity is moderately dilated. 3. Trace aortic regurgitation. 4. Mild to moderate tricuspid regurgitation. Mild to moderate pulmonary hypertension. Pulmonary artery systolic pressure is estimated at 40 mm Hg. 5. Moderate  pericardial effusion with clear fluids. There does not appear to be tamponade by 2-D exam. 6. IVC is dilated with respiratory variation, suggests elevated right heart pressure. Consider further f/u. Abdominal Ultrasound 06/22/2015 Echogenic liver without lesion. This likely represents fatty infiltration.    Review of Systems [facility] Ebony Hail, [place]; 07/25/15 3:35 PM) General Not Present- Anorexia, Fatigue and Fever. Respiratory Present- Decreased Exercise Tolerance, Difficulty Breathing on Exertion and Dyspnea. Not Present- Cough. Cardiovascular Not Present- Chest Pain, Claudications, Edema, Orthopnea, Palpitations and Paroxysmal Nocturnal Dyspnea. Gastrointestinal Not Present- Black, Tarry Stool, Change in Bowel Habits and Nausea. Neurological Not Present- Focal Neurological Symptoms and Syncope. Endocrine Not Present- Cold Intolerance, Excessive Sweating, Heat Intolerance and Thyroid Problems. Hematology Not Present- Anemia, Easy Bruising, Petechiae and Prolonged Bleeding.  Vitals (April Garrison; 07-25-2015 2:44 PM) 07/25/15 2:37 PM Weight: 162.19 lb Height: 64in Body Surface Area: 1.79 m Body Mass Index: 27.84 kg/m  Pulse: 70 (Regular)  P.OX: 93% (Room air) BP: 136/80 (Sitting, Left Arm, Standard)       Physical Exam (Bridgette Ebony Hail, AGNP-C; 2015/07/25 3:35 PM) General Mental Status-Alert. General Appearance-Cooperative, Appears stated age, Not in acute distress. Build & Nutrition-Well nourished and Moderately built.  Head and Neck Thyroid Gland Characteristics - no palpable nodules, no palpable enlargement.  Chest and Lung Exam Palpation Tender - No chest wall tenderness. Auscultation Breath sounds - Clear.  Cardiovascular Inspection Jugular vein - Right - No Distention. Auscultation Heart Sounds - S1 WNL, S2 WNL and No gallop present. Murmurs & Other Heart Sounds: Murmur - Location - Aortic Area and Tricuspid Area. Timing - Early  systolic(long). Grade - III/VI.  Abdomen Palpation/Percussion Normal exam - Non Tender and No hepatosplenomegaly. Liver - Consistency - Hard. Other Characteristics - Hepatomegaly present and Tender. Note: HJR present. Auscultation Normal exam - Bowel sounds normal.  Peripheral Vascular Lower Extremity Inspection - Bilateral - Inspection Normal. Palpation - Edema - Bilateral - No edema. Femoral pulse - Bilateral - Normal. Popliteal pulse - Bilateral - Normal. Dorsalis pedis pulse -  Left - 1+. Right - Absent. Posterior tibial pulse - Bilateral - Absent. Lower Extremity Inspection - Bilateral - Digital ulcers. Note: digital clubbing and sclerodactaly. Carotid arteries - Bilateral-No Carotid bruit. Abdomen-No prominent abdominal aortic pulsation, No epigastric bruit.  Neurologic Neurologic evaluation reveals -alert and oriented x 3 with no impairment of recent or remote memory. Motor-Grossly intact without any focal deficits.  Musculoskeletal Global Assessment Left Lower Extremity - normal range of motion without pain. Right Lower Extremity - normal range of motion without pain.    Assessment & Plan (Bridgette Ebony Hail AGNP-C; 06/28/2015 3:34 PM)  Shortness of breath on exertion (R06.02)  Story: Lexiscan myoview stress test 06/18/2015: 1. Resting EKG demonstrates normal sinus rhythm, left axis deviation, cannot exclude inferior infarct old, Anterior infarct old. Stress EKG was negative for myocardial ischemia. Patient exercised for 4 minutes and 5 seconds and achieved 5.93 Mets. There are frequent PVCs in the recovery phase of the stress test. Stress terminated due to achieving target heart rate, 86% of MPHR. Stress symptoms included dyspnea. 2. Myocardial perfusion imaging is normal. Overall left ventricular systolic function was normal without regional wall motion abnormalities. The left ventricular ejection fraction was 62%.  Impression: EKG 05/30/2015: Normal sinus rhythm  at rate of 68 bpm, left axis deviation, left anterior fascicular block. Anterior infarct old. Low-voltage complexes, pulmonary disease pattern. Nonspecific T-wave normality. Single PAC.  Idiopathic PAH (pulmonary arterial hypertension) (I27.0) Story: Echocardiogram 02/20/2015: 1. Left ventricle cavity is normal in size. Moderate concentric hypertrophy of the left ventricle. Normal global wall motion. Normal diastolic filling pattern. Calculated EF 70%. 2. Left atrial cavity is moderately dilated. 3. Trace aortic regurgitation. 4. Mild to moderate tricuspid regurgitation. Mild to moderate pulmonary hypertension. Pulmonary artery systolic pressure is estimated at 40 mm Hg. 5. Moderate pericardial effusion with clear fluids. There does not appear to be tamponade by 2-D exam. 6. IVC is dilated with respiratory variation, suggests elevated right heart pressure. Consider further f/u.  Scleroderma (M34.9) Story: Followed by Dr. Dossie Der  Benign essential hypertension (I10)  Hyperlipidemia, group A (E78.00)  Palpitations (R00.2)  Hepatomegaly (R16.0) Story: Right upper quadrant abdomen ultrasound 06/22/2015: Echogenic liver without lesion. This likely represents fatty infiltration.   Current Plans Mechanism of underlying disease process and action of medications discussed with the patient. I discussed primary/secondary prevention and also dietary counseling was done. She returns for follow-up of stress test and abdominal ultrasound. Abdominal ultrasound revealed increased echogenicity consistent with fatty infiltration of the liver without cirrhosis. Stress test was negative for evidence of ischemia. Discussed lab results which reveals just minimally elevated TSH, probably normal for age. Lipid panel reveals mildly elevated LDL with completely normal HDL. Given age and normal stress test, will hold off on initiating statin therapy at this time. Schedule for right heart catheterization for further  evaluation of pulmonary hypertension. She'll follow-up with Dr. Einar Gip following catheterization for reevaluation and further recommendations.  *I have discussed this case with Dr. Einar Gip and he participated in formulating the plan.*  Future Plans 06/21/2015: Abdominal ultrasound, limited (76546) - one time Addendum Note(Jagadeesh Carlynn Herald MD; 07/18/2015 10:22 PM) Labs 07/18/2015: Serum glucose 101 mg, BUN 15, serum creatinine 0. 7, potassium 3.7, eGFR 67 mm. HB 13.5/HCT 39.3, platelets 284. Normal indicis. Pro time normal.. Labs stable for Heart Cath  Signed by Nancy Lambert, AGNP-C (06/28/2015 3:35 PM)

## 2015-07-24 ENCOUNTER — Encounter (HOSPITAL_COMMUNITY)
Admission: RE | Disposition: A | Discharge status: Home / Self Care | Payer: Self-pay | Source: Ambulatory Visit | Attending: Cardiology

## 2015-07-24 ENCOUNTER — Ambulatory Visit (HOSPITAL_COMMUNITY)
Admission: RE | Admit: 2015-07-24 | Discharge: 2015-07-24 | Disposition: A | Discharge status: Home / Self Care | Payer: Medicare HMO | Source: Ambulatory Visit | Attending: Cardiology | Admitting: Cardiology

## 2015-07-24 ENCOUNTER — Encounter (HOSPITAL_COMMUNITY): Payer: Self-pay | Admitting: Cardiology

## 2015-07-24 DIAGNOSIS — R160 Hepatomegaly, not elsewhere classified: Secondary | ICD-10-CM | POA: Insufficient documentation

## 2015-07-24 DIAGNOSIS — I7300 Raynaud's syndrome without gangrene: Secondary | ICD-10-CM | POA: Diagnosis not present

## 2015-07-24 DIAGNOSIS — I270 Primary pulmonary hypertension: Secondary | ICD-10-CM | POA: Diagnosis not present

## 2015-07-24 DIAGNOSIS — I071 Rheumatic tricuspid insufficiency: Secondary | ICD-10-CM | POA: Diagnosis not present

## 2015-07-24 DIAGNOSIS — E559 Vitamin D deficiency, unspecified: Secondary | ICD-10-CM | POA: Insufficient documentation

## 2015-07-24 DIAGNOSIS — M349 Systemic sclerosis, unspecified: Secondary | ICD-10-CM | POA: Diagnosis not present

## 2015-07-24 DIAGNOSIS — E7800 Pure hypercholesterolemia, unspecified: Secondary | ICD-10-CM | POA: Insufficient documentation

## 2015-07-24 DIAGNOSIS — I272 Other secondary pulmonary hypertension: Secondary | ICD-10-CM | POA: Diagnosis present

## 2015-07-24 HISTORY — PX: CARDIAC CATHETERIZATION: SHX172

## 2015-07-24 LAB — POCT I-STAT 3, VENOUS BLOOD GAS (G3P V)
Acid-Base Excess: 3 mmol/L — ABNORMAL HIGH (ref 0.0–2.0)
Bicarbonate: 28.5 mEq/L — ABNORMAL HIGH (ref 20.0–24.0)
O2 Saturation: 66 %
TCO2: 30 mmol/L (ref 0–100)
pCO2, Ven: 47.7 mmHg (ref 45.0–50.0)
pH, Ven: 7.385 — ABNORMAL HIGH (ref 7.250–7.300)
pO2, Ven: 35 mmHg (ref 31.0–45.0)

## 2015-07-24 SURGERY — RIGHT HEART CATH

## 2015-07-24 MED ORDER — HEPARIN (PORCINE) IN NACL 2-0.9 UNIT/ML-% IJ SOLN
INTRAMUSCULAR | Status: DC | PRN
  Administered 2015-07-24: 500 mL

## 2015-07-24 MED ORDER — MIDAZOLAM HCL 2 MG/2ML IJ SOLN
INTRAMUSCULAR | Status: AC
  Filled 2015-07-24: qty 2

## 2015-07-24 MED ORDER — SODIUM CHLORIDE 0.9% FLUSH: 3.0000 mL | INTRAVENOUS | Status: DC | PRN

## 2015-07-24 MED ORDER — LIDOCAINE HCL (PF) 1 % IJ SOLN
INTRAMUSCULAR | Status: AC
  Filled 2015-07-24: qty 30

## 2015-07-24 MED ORDER — SODIUM CHLORIDE 0.9 % IV SOLN
INTRAVENOUS | Status: DC
Start: 2015-07-24 — End: 2015-07-24
  Administered 2015-07-24: 07:00:00 via INTRAVENOUS

## 2015-07-24 MED ORDER — FENTANYL CITRATE (PF) 100 MCG/2ML IJ SOLN
INTRAMUSCULAR | Status: DC | PRN
  Administered 2015-07-24: 25 ug via INTRAVENOUS

## 2015-07-24 MED ORDER — SODIUM CHLORIDE 0.9% FLUSH: 3.0000 mL | Freq: Two times a day (BID) | INTRAVENOUS | Status: DC

## 2015-07-24 MED ORDER — SODIUM CHLORIDE 0.9 % IV SOLN: 250.0000 mL | INTRAVENOUS | Status: DC | PRN

## 2015-07-24 MED ORDER — MIDAZOLAM HCL 2 MG/2ML IJ SOLN
INTRAMUSCULAR | Status: DC | PRN
  Administered 2015-07-24: 1 mg via INTRAVENOUS

## 2015-07-24 MED ORDER — LIDOCAINE HCL (PF) 1 % IJ SOLN
INTRAMUSCULAR | Status: DC | PRN
  Administered 2015-07-24: 2 mL

## 2015-07-24 MED ORDER — FENTANYL CITRATE (PF) 100 MCG/2ML IJ SOLN
INTRAMUSCULAR | Status: AC
  Filled 2015-07-24: qty 2

## 2015-07-24 MED ORDER — HEPARIN (PORCINE) IN NACL 2-0.9 UNIT/ML-% IJ SOLN
INTRAMUSCULAR | Status: AC
  Filled 2015-07-24: qty 500

## 2015-07-24 SURGICAL SUPPLY — 8 items
CATH BALLN WEDGE 5F 110CM (CATHETERS) ×2 IMPLANT
COVER PRB 48X5XTLSCP FOLD TPE (BAG) ×1 IMPLANT
COVER PROBE 5X48 (BAG) ×1
GUIDEWIRE .025 260CM (WIRE) ×2 IMPLANT
PACK CARDIAC CATHETERIZATION (CUSTOM PROCEDURE TRAY) ×2 IMPLANT
PROTECTION STATION PRESSURIZED (MISCELLANEOUS) ×2
SHEATH FAST CATH BRACH 5F 5CM (SHEATH) ×2 IMPLANT
STATION PROTECTION PRESSURIZED (MISCELLANEOUS) ×1 IMPLANT

## 2015-07-24 NOTE — Discharge Instructions (Signed)
Call Dr Einar Gip if any problems,questions, or concerns

## 2015-07-24 NOTE — Interval H&P Note (Signed)
History and Physical Interval Note:  07/24/2015 6:35 AM  Nancy Lambert  has presented today for surgery, with the diagnosis of hp  The various methods of treatment have been discussed with the patient and family. After consideration of risks, benefits and other options for treatment, the patient has consented to  Procedure(s): Right Heart Cath (N/A) as a surgical intervention .  The patient's history has been reviewed, patient examined, no change in status, stable for surgery.  I have reviewed the patient's chart and labs.  Questions were answered to the patient's satisfaction.     Adrian Prows

## 2015-08-03 DIAGNOSIS — E785 Hyperlipidemia, unspecified: Secondary | ICD-10-CM | POA: Diagnosis not present

## 2015-08-03 DIAGNOSIS — I270 Primary pulmonary hypertension: Secondary | ICD-10-CM | POA: Diagnosis not present

## 2015-08-03 DIAGNOSIS — R0602 Shortness of breath: Secondary | ICD-10-CM | POA: Diagnosis not present

## 2015-08-03 DIAGNOSIS — M349 Systemic sclerosis, unspecified: Secondary | ICD-10-CM | POA: Diagnosis not present

## 2015-09-03 DIAGNOSIS — C787 Secondary malignant neoplasm of liver and intrahepatic bile duct: Secondary | ICD-10-CM | POA: Diagnosis not present

## 2015-09-03 DIAGNOSIS — M349 Systemic sclerosis, unspecified: Secondary | ICD-10-CM | POA: Diagnosis not present

## 2015-09-03 DIAGNOSIS — M791 Myalgia: Secondary | ICD-10-CM | POA: Diagnosis not present

## 2015-09-03 DIAGNOSIS — R21 Rash and other nonspecific skin eruption: Secondary | ICD-10-CM | POA: Diagnosis not present

## 2015-09-03 DIAGNOSIS — I7300 Raynaud's syndrome without gangrene: Secondary | ICD-10-CM | POA: Diagnosis not present

## 2015-09-03 DIAGNOSIS — R112 Nausea with vomiting, unspecified: Secondary | ICD-10-CM | POA: Diagnosis not present

## 2015-09-03 DIAGNOSIS — R768 Other specified abnormal immunological findings in serum: Secondary | ICD-10-CM | POA: Diagnosis not present

## 2015-09-03 DIAGNOSIS — E440 Moderate protein-calorie malnutrition: Secondary | ICD-10-CM | POA: Diagnosis not present

## 2015-09-25 DIAGNOSIS — M349 Systemic sclerosis, unspecified: Secondary | ICD-10-CM | POA: Diagnosis not present

## 2015-09-25 DIAGNOSIS — M25519 Pain in unspecified shoulder: Secondary | ICD-10-CM | POA: Diagnosis not present

## 2015-09-25 DIAGNOSIS — I7300 Raynaud's syndrome without gangrene: Secondary | ICD-10-CM | POA: Diagnosis not present

## 2015-09-25 DIAGNOSIS — K219 Gastro-esophageal reflux disease without esophagitis: Secondary | ICD-10-CM | POA: Diagnosis not present

## 2015-10-04 DIAGNOSIS — E785 Hyperlipidemia, unspecified: Secondary | ICD-10-CM | POA: Diagnosis not present

## 2015-10-04 DIAGNOSIS — M349 Systemic sclerosis, unspecified: Secondary | ICD-10-CM | POA: Diagnosis not present

## 2015-10-04 DIAGNOSIS — R160 Hepatomegaly, not elsewhere classified: Secondary | ICD-10-CM | POA: Diagnosis not present

## 2015-10-04 DIAGNOSIS — I270 Primary pulmonary hypertension: Secondary | ICD-10-CM | POA: Diagnosis not present

## 2015-11-21 ENCOUNTER — Other Ambulatory Visit: Payer: Self-pay | Admitting: Internal Medicine

## 2015-11-21 DIAGNOSIS — Z1231 Encounter for screening mammogram for malignant neoplasm of breast: Secondary | ICD-10-CM

## 2015-11-23 ENCOUNTER — Ambulatory Visit
Admission: RE | Admit: 2015-11-23 | Discharge: 2015-11-23 | Disposition: A | Discharge status: Home / Self Care | Payer: Medicare Other | Source: Ambulatory Visit | Attending: Internal Medicine | Admitting: Internal Medicine

## 2015-11-23 DIAGNOSIS — Z1231 Encounter for screening mammogram for malignant neoplasm of breast: Secondary | ICD-10-CM | POA: Diagnosis not present

## 2015-11-26 DIAGNOSIS — I10 Essential (primary) hypertension: Secondary | ICD-10-CM | POA: Diagnosis not present

## 2015-11-26 DIAGNOSIS — R0602 Shortness of breath: Secondary | ICD-10-CM | POA: Diagnosis not present

## 2015-11-26 DIAGNOSIS — M349 Systemic sclerosis, unspecified: Secondary | ICD-10-CM | POA: Diagnosis not present

## 2015-11-26 DIAGNOSIS — I270 Primary pulmonary hypertension: Secondary | ICD-10-CM | POA: Diagnosis not present

## 2015-12-21 DIAGNOSIS — M349 Systemic sclerosis, unspecified: Secondary | ICD-10-CM | POA: Diagnosis not present

## 2015-12-21 DIAGNOSIS — I270 Primary pulmonary hypertension: Secondary | ICD-10-CM | POA: Diagnosis not present

## 2015-12-21 DIAGNOSIS — I10 Essential (primary) hypertension: Secondary | ICD-10-CM | POA: Diagnosis not present

## 2015-12-21 DIAGNOSIS — R0602 Shortness of breath: Secondary | ICD-10-CM | POA: Diagnosis not present

## 2016-01-04 DIAGNOSIS — I10 Essential (primary) hypertension: Secondary | ICD-10-CM | POA: Diagnosis not present

## 2016-01-04 DIAGNOSIS — Z0000 Encounter for general adult medical examination without abnormal findings: Secondary | ICD-10-CM | POA: Diagnosis not present

## 2016-01-04 DIAGNOSIS — E559 Vitamin D deficiency, unspecified: Secondary | ICD-10-CM | POA: Diagnosis not present

## 2016-01-04 DIAGNOSIS — R7303 Prediabetes: Secondary | ICD-10-CM | POA: Diagnosis not present

## 2016-01-04 DIAGNOSIS — L539 Erythematous condition, unspecified: Secondary | ICD-10-CM | POA: Diagnosis not present

## 2016-01-16 DIAGNOSIS — I270 Primary pulmonary hypertension: Secondary | ICD-10-CM | POA: Diagnosis not present

## 2016-01-16 DIAGNOSIS — R0602 Shortness of breath: Secondary | ICD-10-CM | POA: Diagnosis not present

## 2016-01-16 DIAGNOSIS — I10 Essential (primary) hypertension: Secondary | ICD-10-CM | POA: Diagnosis not present

## 2016-01-16 DIAGNOSIS — M349 Systemic sclerosis, unspecified: Secondary | ICD-10-CM | POA: Diagnosis not present

## 2016-01-17 ENCOUNTER — Ambulatory Visit (INDEPENDENT_AMBULATORY_CARE_PROVIDER_SITE_OTHER): Payer: Medicare Other | Admitting: Podiatry

## 2016-01-17 ENCOUNTER — Ambulatory Visit (INDEPENDENT_AMBULATORY_CARE_PROVIDER_SITE_OTHER): Payer: Medicare Other

## 2016-01-17 DIAGNOSIS — M79671 Pain in right foot: Secondary | ICD-10-CM

## 2016-01-17 DIAGNOSIS — L84 Corns and callosities: Secondary | ICD-10-CM

## 2016-01-17 DIAGNOSIS — M2040 Other hammer toe(s) (acquired), unspecified foot: Secondary | ICD-10-CM

## 2016-01-17 DIAGNOSIS — M79672 Pain in left foot: Secondary | ICD-10-CM

## 2016-01-17 NOTE — Progress Notes (Signed)
Subjective:     Patient ID: Nancy Lambert, female   DOB: 1940/05/01, 75 y.o.   MRN: [id number]  HPI patient states she has painful calluses of both feet second and fifth metatarsals with long-term issues with feet and history of surgery   Review of Systems  All other systems reviewed and are negative.      Objective:   Physical Exam  Constitutional: She is oriented to person, place, and time.  Cardiovascular: Intact distal pulses.   Musculoskeletal: Normal range of motion.  Neurological: She is oriented to person, place, and time.  Skin: Skin is warm.  Nursing note and vitals reviewed.  neurovascular status intact muscle strength adequate range of motion within normal limits with patient noted to have discomfort in the plantar second and fifth metatarsal bilateral with scars on the left second and fifth metatarsal. 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 May require more aggressive treatment depending on response  X-ray report indicates that the osteotomies did heal well from previous surgery with no indications of decalcification

## 2016-01-30 ENCOUNTER — Ambulatory Visit: Payer: Medicare HMO | Admitting: Internal Medicine

## 2016-01-31 DIAGNOSIS — H35379 Puckering of macula, unspecified eye: Secondary | ICD-10-CM | POA: Diagnosis not present

## 2016-01-31 DIAGNOSIS — H40023 Open angle with borderline findings, high risk, bilateral: Secondary | ICD-10-CM | POA: Diagnosis not present

## 2016-01-31 DIAGNOSIS — H2513 Age-related nuclear cataract, bilateral: Secondary | ICD-10-CM | POA: Diagnosis not present

## 2016-02-07 DIAGNOSIS — I10 Essential (primary) hypertension: Secondary | ICD-10-CM | POA: Diagnosis not present

## 2016-02-07 DIAGNOSIS — R0602 Shortness of breath: Secondary | ICD-10-CM | POA: Diagnosis not present

## 2016-02-07 DIAGNOSIS — M349 Systemic sclerosis, unspecified: Secondary | ICD-10-CM | POA: Diagnosis not present

## 2016-02-07 DIAGNOSIS — I270 Primary pulmonary hypertension: Secondary | ICD-10-CM | POA: Diagnosis not present

## 2016-02-19 ENCOUNTER — Encounter: Payer: Medicare Other | Admitting: Internal Medicine

## 2016-02-19 ENCOUNTER — Encounter: Payer: Self-pay | Admitting: Internal Medicine

## 2016-02-19 ENCOUNTER — Ambulatory Visit: Payer: Self-pay | Admitting: Internal Medicine

## 2016-02-19 ENCOUNTER — Ambulatory Visit (INDEPENDENT_AMBULATORY_CARE_PROVIDER_SITE_OTHER): Payer: Medicare Other | Admitting: Internal Medicine

## 2016-02-19 ENCOUNTER — Ambulatory Visit (HOSPITAL_COMMUNITY)
Admission: RE | Admit: 2016-02-19 | Discharge: 2016-02-19 | Disposition: A | Discharge status: Home / Self Care | Payer: Medicare Other | Source: Ambulatory Visit | Attending: Internal Medicine | Admitting: Internal Medicine

## 2016-02-19 VITALS — BP 124/86 | HR 55 | Ht 64.0 in | Wt 157.2 lb

## 2016-02-19 DIAGNOSIS — R0600 Dyspnea, unspecified: Secondary | ICD-10-CM | POA: Diagnosis not present

## 2016-02-19 DIAGNOSIS — R0609 Other forms of dyspnea: Secondary | ICD-10-CM | POA: Diagnosis not present

## 2016-02-19 DIAGNOSIS — I272 Pulmonary hypertension, unspecified: Secondary | ICD-10-CM

## 2016-02-19 LAB — PULMONARY FUNCTION TEST
DL/VA % pred: 88 %
DL/VA: 4.24 ml/min/mmHg/L
DLCO unc % pred: 46 %
DLCO unc: 11.26 ml/min/mmHg
FEF 25-75 Post: 1.81 L/sec
FEF 25-75 Pre: 1.88 L/sec
FEF2575-%Change-Post: -3 %
FEF2575-%Pred-Post: 115 %
FEF2575-%Pred-Pre: 119 %
FEV1-%Change-Post: 1 %
FEV1-%Pred-Post: 91 %
FEV1-%Pred-Pre: 90 %
FEV1-Post: 1.6 L
FEV1-Pre: 1.58 L
FEV1FVC-%Change-Post: 2 %
FEV1FVC-%Pred-Pre: 109 %
FEV6-%Change-Post: 0 %
FEV6-%Pred-Post: 85 %
FEV6-%Pred-Pre: 86 %
FEV6-Post: 1.86 L
FEV6-Pre: 1.87 L
FEV6FVC-%Change-Post: 0 %
FEV6FVC-%Pred-Post: 104 %
FEV6FVC-%Pred-Pre: 104 %
FVC-%Change-Post: 0 %
FVC-%Pred-Post: 82 %
FVC-%Pred-Pre: 82 %
FVC-Post: 1.86 L
FVC-Pre: 1.88 L
Post FEV1/FVC ratio: 86 %
Post FEV6/FVC ratio: 100 %
Pre FEV1/FVC ratio: 84 %
Pre FEV6/FVC Ratio: 100 %
RV % pred: 88 %
RV: 1.99 L
TLC % pred: 77 %
TLC: 3.89 L

## 2016-02-19 MED ORDER — ALBUTEROL SULFATE (2.5 MG/3ML) 0.083% IN NEBU
2.5000 mg | INHALATION_SOLUTION | Freq: Once | RESPIRATORY_TRACT | Status: AC
  Administered 2016-02-19: 2.5 mg via RESPIRATORY_TRACT

## 2016-02-19 NOTE — Patient Instructions (Signed)
Continue the Try prilosec otc 20mg   Take 30-60 min before first meal of the day and Pepcid ac (famotidine) 20 mg one @  bedtime until cough is completely gone for at least a week without the need for cough suppression      Please schedule a follow up visit in 6  months but call sooner if needed with 6 min walk on return

## 2016-02-19 NOTE — Progress Notes (Signed)
Subjective:    Patient ID: Nancy Lambert, female    DOB: July 01, 1940,    MRN: [id number]      Brief patient profile:  8 yobf mild smoking hx / quit in her 50's referred to pulmonary clinic 05/28/2015 by Dr Titus Mould with dx of limited scleroderma and cardiomegaly being eval by Dr Nadyne Coombes also     05/28/2015 1st [facility] office visit/ Nancy Lambert   Chief Complaint  Patient presents with  . Pulmonary Consult    Referred by Dr. Dossie Der. Pt c/o SOB for over 10 yrs. She gets SOB walking up stairs. She also notices SOB sometimes when lying down. She has palpitations when SOB occurs.   at home has to go up same steps x 12 years and noted 10 years ago slt reduction tolerance but never has to stop and really no progression of doe/ no problem with flat surfaces at nl pace  Sleep disrupted  by overt GERD/ on prilosec qd and nothing at hs in terms of gerd rx  rec Add pepcid 20 mg at bedtime to the prilosec which you should take Take 30-60 min before first meal of the day  GERD diet  Be sure to Ask Dr Nadyne Coombes to send me his report on your heart    02/19/2016  f/u ov/Nancy Lambert re: scleroderma with mild [place] Chief Complaint  Patient presents with  . Follow-up    PFT [facility] at [facility]. Pt states that her breathing has been doing well since last OV.    doe = MMRC1 = can walk nl pace, flat grade, can't hurry or go uphills or steps s sob    No obvious day to day or daytime variability or assoc excess/ purulent sputum or mucus plugs or hemoptysis or cp or chest tightness, subjective wheeze or overt sinus or hb symptoms. No unusual exp hx or h/o childhood pna/ asthma or knowledge of premature birth.  Sleeping ok without nocturnal  or early am exacerbation  of respiratory  c/o's or need for noct saba. Also denies any obvious fluctuation of symptoms with weather or environmental changes or other aggravating or alleviating factors except as outlined above   Current Medications, Allergies, Complete Past Medical History, Past  Surgical History, Family History, and Social History were reviewed in [employer] record.  ROS  The following are not active complaints unless bolded sore throat, dysphagia, dental problems, itching, sneezing,  nasal congestion or excess/ purulent secretions, ear ache,   fever, chills, sweats, unintended wt loss, classically pleuritic or exertional cp,  orthopnea pnd or leg swelling, presyncope, palpitations, abdominal pain, anorexia, nausea, vomiting, diarrhea  or change in bowel or bladder habits, change in stools or urine, dysuria,hematuria,  rash, arthralgias, visual complaints, headache, numbness, weakness or ataxia or problems with walking or coordination,  change in mood/affect or memory.                Objective:   Physical Exam  amb bf nad  02/19/2016      157   02/19/16 157 lb 3.2 oz (71.3 kg)  07/24/15 160 lb (72.6 kg)  05/28/15 162 lb (73.5 kg)    Vital signs reviewed   HEENT: nl dentition, turbinates, and oropharynx. Nl external ear canals without cough reflex   NECK :  without JVD/Nodes/TM/ nl carotid upstrokes bilaterally   LUNGS: no acc muscle use,  Nl contour chest which is clear to A and P bilaterally without cough on insp or exp maneuvers  CV:  RRR  no s3 or murmur  ?  increase in P2, no edema   ABD:  soft and nontender with nl inspiratory excursion in the supine position. No bruits or organomegaly, bowel sounds nl  MS:  Nl gait/ ext warm without deformities, calf tenderness, cyanosis or clubbing No obvious joint restrictions   SKIN: warm and dry without lesions  Mild/moderate bilateral  sclerodactyly   NEURO:  alert, approp, nl sensorium with  no motor deficits          Assessment & Plan:

## 2016-02-21 NOTE — Assessment & Plan Note (Addendum)
2 D Echo 02/20/15  LVH with mod LAE and Mild TR with PAS 40 - Pfts 01/2015  FVC 2.02  (92%) s obst,  DLCO 70/ corrects  to 100% for alv  vol - 05/28/2015  Walked RA x 3 laps @ 185 ft each stopped due end of study, nl pace/   sats 97% no sob  - PFT's  02/19/2016  FEVC 1.88 (82%) no obst p nothing prior to study with DLCO  46 % corrects to 88 % for alv volume    There is a definite downward trend in lung volumes, dlco but pt feeling the same or slt better in terms of ex tol with likely mild PAH (see separate a/p)  as well so need to follow consistently with rheumatology for any new recs re treating the underlying dz and here q 6 m  With 6 m walk on return and pfts yearly   I had an extended discussion with the patient reviewing all relevant studies completed to date and  lasting 15 to 20 minutes of a 25 minute visit    Each maintenance medication was reviewed in detail including most importantly the difference between maintenance and prns and under what circumstances the prns are to be triggered using an action plan format that is not reflected in the computer generated alphabetically organized AVS.    Please see instructions for details which were reviewed in writing and the patient given a copy highlighting the part that I personally wrote and discussed at today's ov.

## 2016-02-21 NOTE — Assessment & Plan Note (Signed)
[place]  07/24/15  PA mean 29 with wedge 15 and CO = 4.16 so PVR >  Adempas rx per [place]  This is very mild but worrisome given trend in pfts > f/u [place]

## 2016-03-03 DIAGNOSIS — I270 Primary pulmonary hypertension: Secondary | ICD-10-CM | POA: Diagnosis not present

## 2016-03-03 DIAGNOSIS — R0602 Shortness of breath: Secondary | ICD-10-CM | POA: Diagnosis not present

## 2016-03-03 DIAGNOSIS — J028 Acute pharyngitis due to other specified organisms: Secondary | ICD-10-CM | POA: Diagnosis not present

## 2016-04-07 DIAGNOSIS — I270 Primary pulmonary hypertension: Secondary | ICD-10-CM | POA: Diagnosis not present

## 2016-04-07 DIAGNOSIS — R0602 Shortness of breath: Secondary | ICD-10-CM | POA: Diagnosis not present

## 2016-04-30 DIAGNOSIS — K219 Gastro-esophageal reflux disease without esophagitis: Secondary | ICD-10-CM | POA: Diagnosis not present

## 2016-04-30 DIAGNOSIS — M349 Systemic sclerosis, unspecified: Secondary | ICD-10-CM | POA: Diagnosis not present

## 2016-04-30 DIAGNOSIS — R21 Rash and other nonspecific skin eruption: Secondary | ICD-10-CM | POA: Diagnosis not present

## 2016-04-30 DIAGNOSIS — I7300 Raynaud's syndrome without gangrene: Secondary | ICD-10-CM | POA: Diagnosis not present

## 2016-05-01 DIAGNOSIS — R0602 Shortness of breath: Secondary | ICD-10-CM | POA: Diagnosis not present

## 2016-05-01 DIAGNOSIS — I313 Pericardial effusion (noninflammatory): Secondary | ICD-10-CM | POA: Diagnosis not present

## 2016-05-01 DIAGNOSIS — I270 Primary pulmonary hypertension: Secondary | ICD-10-CM | POA: Diagnosis not present

## 2016-05-22 DIAGNOSIS — H40023 Open angle with borderline findings, high risk, bilateral: Secondary | ICD-10-CM | POA: Diagnosis not present

## 2016-05-22 DIAGNOSIS — R51 Headache: Secondary | ICD-10-CM | POA: Diagnosis not present

## 2016-06-24 DIAGNOSIS — I270 Primary pulmonary hypertension: Secondary | ICD-10-CM | POA: Diagnosis not present

## 2016-06-24 DIAGNOSIS — R0602 Shortness of breath: Secondary | ICD-10-CM | POA: Diagnosis not present

## 2016-08-18 ENCOUNTER — Encounter: Payer: Self-pay | Admitting: Internal Medicine

## 2016-08-18 ENCOUNTER — Ambulatory Visit (INDEPENDENT_AMBULATORY_CARE_PROVIDER_SITE_OTHER): Payer: Medicare Other | Admitting: Internal Medicine

## 2016-08-18 ENCOUNTER — Ambulatory Visit (INDEPENDENT_AMBULATORY_CARE_PROVIDER_SITE_OTHER): Payer: Medicare Other | Admitting: *Deleted

## 2016-08-18 VITALS — BP 126/70 | HR 58 | Ht 64.0 in | Wt 157.8 lb

## 2016-08-18 DIAGNOSIS — I272 Pulmonary hypertension, unspecified: Secondary | ICD-10-CM

## 2016-08-18 DIAGNOSIS — R0602 Shortness of breath: Secondary | ICD-10-CM

## 2016-08-18 DIAGNOSIS — R1319 Other dysphagia: Secondary | ICD-10-CM

## 2016-08-18 DIAGNOSIS — R1310 Dysphagia, unspecified: Secondary | ICD-10-CM

## 2016-08-18 NOTE — Patient Instructions (Addendum)
Add pepcid 20 mg at bedtime   .GERD (REFLUX)  is an extremely common cause of respiratory symptoms just like yours , many times with no obvious heartburn at all.    It can be treated with medication, but also with lifestyle changes including elevation of the head of your bed (ideally with 6 inch  bed blocks),  Smoking cessation, avoidance of late meals, excessive alcohol, and avoid fatty foods, chocolate, peppermint, colas, red wine, and acidic juices such as orange juice.  NO MINT OR MENTHOL PRODUCTS SO NO COUGH DROPS   USE SUGARLESS CANDY INSTEAD (Jolley ranchers or Stover's or Life Savers) or even ice chips will also do - the key is to swallow to prevent all throat clearing. NO OIL BASED VITAMINS - use powdered substitutes.    If swallowing getting worse on this will need to GI  Referral (call if you want me to refer to our group)   Please schedule a follow up office visit in 6 weeks, call sooner if needed with PFTs on return  Add needs cxr also on return

## 2016-08-18 NOTE — Progress Notes (Signed)
Subjective:    Patient ID: Nancy Lambert, female    DOB: Apr 06, 1940,    MRN: [id number]   Brief patient profile:  75 yobf mild smoking hx / quit in her 4's referred to pulmonary clinic 05/28/2015 by Dr Titus Mould with dx of limited scleroderma and cardiomegaly being eval by Dr Nadyne Coombes also     05/28/2015 1st [facility] office visit/ Nancy Lambert   Chief Complaint  Patient presents with  . Pulmonary Consult    Referred by Dr. Dossie Der. Pt c/o SOB for over 10 yrs. She gets SOB walking up stairs. She also notices SOB sometimes when lying down. She has palpitations when SOB occurs.   at home has to go up same steps x 12 years and noted 10 years ago slt reduction tolerance but never has to stop and really no progression of doe/ no problem with flat surfaces at nl pace  Sleep disrupted  by overt GERD/ on prilosec qd and nothing at hs in terms of gerd rx  rec Add pepcid 20 mg at bedtime to the prilosec which you should take Take 30-60 min before first meal of the day  GERD diet  Be sure to Ask Dr Nadyne Coombes to send me his report on your heart    02/19/2016  f/u ov/Nancy Lambert re: scleroderma with mild [place] Chief Complaint  Patient presents with  . Follow-up    PFT today at [facility]. Pt states that her breathing has been doing well since last OV.    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 Follow-up    Breathing is doing well. 6MW test done today. No new co's.    doe = MMRC1 = can walk nl pace, flat grade, can't hurry or go uphills  or steps s sob   Main concern is swallowing, occ choke on food despite ppi ac   No obvious day to day or daytime variability or assoc excess/ purulent sputum or mucus plugs or hemoptysis or cp or chest tightness, subjective wheeze or overt sinus or hb symptoms. No unusual exp hx or h/o childhood pna/ asthma or knowledge of premature birth.  Sleeping ok without nocturnal  or early am exacerbation  of respiratory  c/o's or need for noct saba. Also denies any obvious fluctuation of symptoms with weather or environmental changes or other aggravating or alleviating factors except as outlined above   Current Medications, Allergies, Complete Past Medical History, Past Surgical History, Family History, and Social History were reviewed in [employer] record.  ROS  The following are not active complaints unless bolded sore throat, dysphagia, dental problems, itching, sneezing,  nasal congestion or excess/ purulent secretions, ear ache,   fever, chills, sweats, unintended wt loss, classically pleuritic or exertional cp,  orthopnea pnd or leg swelling, presyncope, palpitations, abdominal pain, anorexia, nausea, vomiting, diarrhea  or change in bowel or bladder habits, change in stools  or urine, dysuria,hematuria,  rash, arthralgias, visual complaints, headache, numbness, weakness or ataxia or problems with walking or coordination,  change in mood/affect or memory.                     Objective:   Physical Exam  amb bf nad   08/18/2016        157  02/19/2016      157   02/19/16 157 lb 3.2 oz (71.3 kg)  07/24/15 160 lb (72.6 kg)  05/28/15 162 lb (73.5 kg)    Vital signs reviewed  - Note on arrival 02 sats  99% on RA    HEENT: nl dentition, turbinates, and oropharynx. Nl external ear canals without cough reflex   NECK :  without JVD/Nodes/TM/ nl carotid upstrokes bilaterally   LUNGS: no acc muscle use,  Nl contour chest which is clear to A and P bilaterally without  cough on insp or exp maneuvers   CV:  RRR  no s3 or murmur  ? slt  increase in P2, no edema   ABD:  soft and nontender with nl inspiratory excursion in the supine position. No bruits or organomegaly, bowel sounds nl  MS:  Nl gait/ ext warm without deformities, calf tenderness, cyanosis or clubbing No obvious joint restrictions   SKIN: warm and dry without lesions  Mild/moderate bilateral  sclerodactyly   NEURO:  alert, approp, nl sensorium with  no motor deficits          Assessment & Plan:

## 2016-08-18 NOTE — Progress Notes (Signed)
SIX MIN WALK 08/18/2016 05/28/2015  Medications ASA 81 tab and Prilosec 20mg  at 6:30am -  Supplimental Oxygen during Test? (L/min) No No  Laps 7 -  Partial Lap (in Meters) 0 -  Baseline BP (sitting) 102/62 -  Baseline Heartrate 65 -  Baseline Dyspnea (Borg Scale) 1 -  Baseline Fatigue (Borg Scale) 2 -  Baseline SPO2 100 -  BP (sitting) 132/84 -  Heartrate 90 -  Dyspnea (Borg Scale) 1 -  Fatigue (Borg Scale) 2 -  SPO2 98 -  BP (sitting) 108/70 -  Heartrate 59 -  SPO2 100 -  Stopped or Paused before Six Minutes No -  Interpretation Dizziness -  Distance Completed 336 -  Tech Comments: pt walked at a normal pace (per pt)- no desat at end of test, no breaks during test///amg  normal pace/no SOB//lmr

## 2016-08-20 DIAGNOSIS — R1310 Dysphagia, unspecified: Secondary | ICD-10-CM | POA: Insufficient documentation

## 2016-08-20 NOTE — Assessment & Plan Note (Signed)
Advised to add pepcid 20 mg at hs plus gerd diet and f/u with gi prn

## 2016-08-20 NOTE — Assessment & Plan Note (Addendum)
[place]  07/24/15  PA mean 29 with wedge 15 and CO = 4.16 so PVR >  Adempas rx per Gangi -  6 min walk  08/18/2016  336 s desats   Relatively well compensated on present rx > no changes needed > f/u pfts planned

## 2016-08-27 ENCOUNTER — Telehealth: Payer: Self-pay | Admitting: Internal Medicine

## 2016-08-27 NOTE — Telephone Encounter (Signed)
Pt said she would like a call before 12 if possible b/c she has another DR. Appoint.Hillery Hunter

## 2016-08-27 NOTE — Telephone Encounter (Signed)
LMTCB

## 2016-08-28 NOTE — Telephone Encounter (Signed)
Pt called needing clarification about the pepcid. Informed her of what Dr. Melvyn Novas wrote on her avs. She is going to buy this otc. She had no further questions. Nothing further is needed.  2. Tanda Rockers, MD (Physician) at 08/18/2016 9:39 AM - Signed    Add pepcid 20 mg at bedtime   .GERD (REFLUX)  is an extremely common cause of respiratory symptoms just like yours , many times with no obvious heartburn at all.    It can be treated with medication, but also with lifestyle changes including elevation of the head of your bed (ideally with 6 inch  bed blocks),  Smoking cessation, avoidance of late meals, excessive alcohol, and avoid fatty foods, chocolate, peppermint, colas, red wine, and acidic juices such as orange juice.  NO MINT OR MENTHOL PRODUCTS SO NO COUGH DROPS   USE SUGARLESS CANDY INSTEAD (Jolley ranchers or Stover's or Life Savers) or even ice chips will also do - the key is to swallow to prevent all throat clearing. NO OIL BASED VITAMINS - use powdered substitutes.    If swallowing getting worse on this will need to GI  Referral (call if you want me to refer to our group)   Please schedule a follow up office visit in 6 weeks, call sooner if needed with PFTs on return  Add needs cxr also on return

## 2016-10-03 ENCOUNTER — Ambulatory Visit: Payer: Medicare Other | Admitting: Internal Medicine

## 2016-10-08 ENCOUNTER — Encounter: Payer: Self-pay | Admitting: Internal Medicine

## 2016-10-08 ENCOUNTER — Ambulatory Visit (INDEPENDENT_AMBULATORY_CARE_PROVIDER_SITE_OTHER): Payer: Medicare Other | Admitting: Internal Medicine

## 2016-10-08 ENCOUNTER — Ambulatory Visit (INDEPENDENT_AMBULATORY_CARE_PROVIDER_SITE_OTHER)
Admission: RE | Admit: 2016-10-08 | Discharge: 2016-10-08 | Disposition: A | Discharge status: Home / Self Care | Payer: Medicare Other | Source: Ambulatory Visit | Attending: Internal Medicine | Admitting: Internal Medicine

## 2016-10-08 VITALS — BP 106/66 | HR 53 | Ht 64.0 in | Wt 153.0 lb

## 2016-10-08 DIAGNOSIS — I272 Pulmonary hypertension, unspecified: Secondary | ICD-10-CM

## 2016-10-08 DIAGNOSIS — I517 Cardiomegaly: Secondary | ICD-10-CM | POA: Diagnosis not present

## 2016-10-08 LAB — PULMONARY FUNCTION TEST
DL/VA % pred: 113 %
DL/VA: 5.44 ml/min/mmHg/L
DLCO cor % pred: 73 %
DLCO cor: 17.86 ml/min/mmHg
DLCO unc % pred: 64 %
DLCO unc: 15.67 ml/min/mmHg
FEF 25-75 Post: 2.78 L/sec
FEF 25-75 Pre: 2.17 L/sec
FEF2575-%Change-Post: 28 %
FEF2575-%Pred-Post: 179 %
FEF2575-%Pred-Pre: 139 %
FEV1-%Change-Post: 3 %
FEV1-%Pred-Post: 96 %
FEV1-%Pred-Pre: 93 %
FEV1-Post: 1.67 L
FEV1-Pre: 1.61 L
FEV1FVC-%Change-Post: 2 %
FEV1FVC-%Pred-Pre: 112 %
FEV6-%Change-Post: 1 %
FEV6-%Pred-Post: 87 %
FEV6-%Pred-Pre: 86 %
FEV6-Post: 1.88 L
FEV6-Pre: 1.86 L
FEV6FVC-%Pred-Post: 104 %
FEV6FVC-%Pred-Pre: 104 %
FVC-%Change-Post: 1 %
FVC-%Pred-Post: 84 %
FVC-%Pred-Pre: 83 %
FVC-Post: 1.88 L
FVC-Pre: 1.86 L
Post FEV1/FVC ratio: 89 %
Post FEV6/FVC ratio: 100 %
Pre FEV1/FVC ratio: 86 %
Pre FEV6/FVC Ratio: 100 %
RV % pred: 69 %
RV: 1.59 L
TLC % pred: 69 %
TLC: 3.53 L

## 2016-10-08 NOTE — Patient Instructions (Addendum)
Be sure to take the pepcid ac 20 mg (available over the counter or with prescription at bedtime each night and let you doctors know if swallowing problems develop so you can be referred as needed to a swallowing doctor Physiological scientist)   Please remember to go to the  x-ray department downstairs in the basement  for your tests - we will call you with the results when they are available.      No need for regular pulmonary follow up - as directed by [place] and [facility]

## 2016-10-08 NOTE — Progress Notes (Signed)
Subjective:    Patient ID: Nancy Lambert, female    DOB: 18-Nov-1940,    MRN: [id number]   Brief patient profile:  33 yobf mild smoking hx / quit in her 76's referred to pulmonary clinic 05/28/2015 by Dr Titus Mould with dx of limited scleroderma and cardiomegaly being eval by Dr Nadyne Coombes also      History of Present Illness  05/28/2015 1st [facility] office visit/ Nancy Lambert   Chief Complaint  Patient presents with  . Pulmonary Consult    Referred by Dr. Dossie Der. Pt c/o SOB for over 10 yrs. She gets SOB walking up stairs. She also notices SOB sometimes when lying down. She has palpitations when SOB occurs.   at home has to go up same steps x 12 years and noted 10 years ago slt reduction tolerance but never has to stop and really no progression of doe/ no problem with flat surfaces at nl pace  Sleep disrupted  by overt GERD/ on prilosec qd and nothing at hs in terms of gerd rx  rec Add pepcid 20 mg at bedtime to the prilosec which you should take Take 30-60 min before first meal of the day  GERD diet  Be sure to Ask Dr Nadyne Coombes to send me his report on your heart    02/19/2016  f/u ov/Nancy Lambert re: scleroderma with mild [place] Chief Complaint  Patient presents with  . Follow-up    PFT today at [facility]. Pt states that her breathing has been doing well since last OV.    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 Follow-up    Breathing is doing well. 6MW test done today. No new co's.    doe = MMRC1 = can walk nl pace, flat  grade, can't hurry or go uphills or steps s sob   Main concern is swallowing, occ choke on food despite ppi ac  rec Add pepcid 20 mg at bedtime  GERD  Diet  If swallowing getting worse on this will need to GI  Referral (call if you want me to refer to our group) Please schedule a follow up office visit in 6 weeks, call sooner if needed with PFTs on return      10/08/2016  f/u ov/Nancy Lambert re: scleroderma  On Adempas per [facility]  Chief Complaint  Patient presents with  . Follow-up    Pt here today to discuss PFt results, Pt is still having increase sob, she is still coughing mainly at night unable to produce mucus, Denies wheezing,fever, chest tightness   doe still @ MMRC1 = can walk nl pace, flat grade, can't hurry or go uphills or steps s sob   No worse dysphagia, some noct cough ? Not taking h2 hs consistently   No obvious day to day or daytime variability or assoc excess/ purulent sputum or mucus plugs or hemoptysis or cp or chest tightness, subjective wheeze  or overt sinus or hb symptoms. No unusual exp hx or h/o childhood pna/ asthma or knowledge of premature birth.  Sleeping ok without nocturnal  or early am exacerbation  of respiratory  c/o's or need for noct saba. Also denies any obvious fluctuation of symptoms with weather or environmental changes or other aggravating or alleviating factors except as outlined above   Current Medications, Allergies, Complete Past Medical History, Past Surgical History, Family History, and Social History were reviewed in [employer] record.  ROS  The following are not active complaints unless bolded sore throat, dysphagia, dental problems, itching, sneezing,  nasal congestion or excess/ purulent secretions, ear ache,   fever, chills, sweats, unintended wt loss, classically pleuritic or exertional cp,  orthopnea pnd or leg swelling, presyncope, palpitations, abdominal pain, anorexia, nausea, vomiting, diarrhea  or change in bowel or  bladder habits, change in stools or urine, dysuria,hematuria,  rash, arthralgias, visual complaints, headache, numbness, weakness or ataxia or problems with walking or coordination,  change in mood/affect or memory.               Objective:   Physical Exam  amb bf nad  10/08/2016        153  08/18/2016        157  02/19/2016      157   02/19/16 157 lb 3.2 oz (71.3 kg)  07/24/15 160 lb (72.6 kg)  05/28/15 162 lb (73.5 kg)    Vital signs reviewed  - Note on arrival 02 sats  98% on RA   HEENT: nl dentition, turbinates, and oropharynx. Nl external ear canals without cough reflex   NECK :  without JVD/Nodes/TM/ nl carotid upstrokes bilaterally   LUNGS: no acc muscle use,  Nl contour chest with minimal insp crackles R > L    CV:  RRR  no s3 or murmur  ? slt  increase in P2, no edema   ABD:  soft and nontender with nl inspiratory excursion in the supine position. No bruits or organomegaly, bowel sounds nl  MS:  Nl gait/ ext warm without deformities, calf tenderness, cyanosis or clubbing No obvious joint restrictions   SKIN: warm and dry without lesions  Mild/moderate bilateral  sclerodactyly   NEURO:  alert, approp, nl sensorium with  no motor deficits     CXR PA and Lateral:   10/08/2016 :    I personally reviewed images and agree with radiology impression as follows:   1. No acute cardiopulmonary disease. 2. Cardiomegaly.        Assessment & Plan:   Outpatient Encounter Prescriptions as of 10/08/2016  Medication Sig  . aspirin EC 81 MG tablet Take 81 mg by mouth daily.  Marland Kitchen gabapentin (NEURONTIN) 300 MG capsule Take 300 mg by mouth 2 (two) times daily.  . Riociguat (ADEMPAS) 1.5 MG TABS Take 1 tablet by mouth 3 (three) times daily.  . simethicone (MYLICON) 80 MG chewable tablet Chew 80 mg by mouth every 6 (six) hours as needed for flatulence.  . [DISCONTINUED] omeprazole (PRILOSEC) 20 MG capsule Take 20 mg by mouth every morning.  . famotidine (PEPCID) 20 MG tablet One  at bedtime  . pantoprazole (PROTONIX) 40 MG tablet Take 1 tablet (40 mg total) by mouth daily. Take 30-60 min before first meal of the day   No facility-administered encounter medications on file as of 10/08/2016.

## 2016-10-08 NOTE — Progress Notes (Signed)
PFT done today. 

## 2016-10-09 MED ORDER — PANTOPRAZOLE SODIUM 40 MG PO TBEC
40.0000 mg | DELAYED_RELEASE_TABLET | Freq: Every day | ORAL | 2 refills | Status: DC
Start: 2016-10-09 — End: 2016-12-29

## 2016-10-09 MED ORDER — FAMOTIDINE 20 MG PO TABS: ORAL_TABLET | ORAL | 11 refills | Status: DC

## 2016-10-09 NOTE — Assessment & Plan Note (Signed)
[place]  07/24/15  PA mean 29 with wedge 15 and CO = 4.16 so PVR = 3.3 >  Adempas rx per Gangi -  6 min walk  08/18/2016  336 s desats  - PFT's  10/08/2016  FVC 1.61 (83%) s obst p no rx  prior to study with DLCO  64/73c % corrects to 113  % for alv volume    I had an extended final summary discussion with the patient reviewing all relevant studies completed to date and  lasting 15 to 20 minutes of a 25 minute visit on the following issues:   No evidence of ILD at this point and PAH is mild and f/u by Gangi planned  The main concern I have is persistent mild dysphagia and risk of asp/acute lung injury related to es dysfunction from scleroderma but defer w/u / rx to Rheum  I did rec she take ppi ac and h2 hs as prev rec but not sure she's doing   Each maintenance medication was reviewed in detail including most importantly the difference between maintenance and as needed and under what circumstances the prns are to be used.  Please see AVS for specific  Instructions which are unique to this visit and I personally typed out  which were reviewed in detail in writing with the patient and a copy provided.

## 2016-10-10 NOTE — Progress Notes (Signed)
Spoke with pt and notified of results per Dr. Wert. Pt verbalized understanding and denied any questions. 

## 2016-10-13 DIAGNOSIS — Z1389 Encounter for screening for other disorder: Secondary | ICD-10-CM | POA: Diagnosis not present

## 2016-10-13 DIAGNOSIS — L940 Localized scleroderma [morphea]: Secondary | ICD-10-CM | POA: Diagnosis not present

## 2016-10-13 DIAGNOSIS — I270 Primary pulmonary hypertension: Secondary | ICD-10-CM | POA: Diagnosis not present

## 2016-10-13 DIAGNOSIS — G44209 Tension-type headache, unspecified, not intractable: Secondary | ICD-10-CM | POA: Diagnosis not present

## 2016-10-13 DIAGNOSIS — R7303 Prediabetes: Secondary | ICD-10-CM | POA: Diagnosis not present

## 2016-10-13 DIAGNOSIS — I10 Essential (primary) hypertension: Secondary | ICD-10-CM | POA: Diagnosis not present

## 2016-10-13 DIAGNOSIS — D649 Anemia, unspecified: Secondary | ICD-10-CM | POA: Diagnosis not present

## 2016-10-20 DIAGNOSIS — I270 Primary pulmonary hypertension: Secondary | ICD-10-CM | POA: Diagnosis not present

## 2016-10-20 DIAGNOSIS — R0602 Shortness of breath: Secondary | ICD-10-CM | POA: Diagnosis not present

## 2016-10-21 ENCOUNTER — Other Ambulatory Visit: Payer: Self-pay | Admitting: Internal Medicine

## 2016-10-21 DIAGNOSIS — E2839 Other primary ovarian failure: Secondary | ICD-10-CM

## 2016-10-29 ENCOUNTER — Encounter: Payer: Self-pay | Admitting: Gastroenterology

## 2016-10-29 ENCOUNTER — Telehealth: Payer: Self-pay | Admitting: Internal Medicine

## 2016-10-29 DIAGNOSIS — R1310 Dysphagia, unspecified: Secondary | ICD-10-CM

## 2016-10-29 NOTE — Telephone Encounter (Signed)
Called and spoke with pt and she is aware of MW recs and that the order has been placed to get her set up with GI for these issues. Nothing further is needed.

## 2016-10-29 NOTE — Telephone Encounter (Signed)
Spoke with patient. She was last seen by MW on 10/08/16. She wants a referral to GI due to her trouble with swallowing. She said she had mentioned this briefly to MW during his last OV.    MW, are you ok with [place] placing a referral for her? Please advise. Thanks!

## 2016-10-29 NOTE — Telephone Encounter (Signed)
Yes, prefer [place] who has special  interest in dysphagia, willing to wait until she's available

## 2016-11-06 DIAGNOSIS — I7300 Raynaud's syndrome without gangrene: Secondary | ICD-10-CM | POA: Diagnosis not present

## 2016-11-06 DIAGNOSIS — M349 Systemic sclerosis, unspecified: Secondary | ICD-10-CM | POA: Diagnosis not present

## 2016-11-06 DIAGNOSIS — R21 Rash and other nonspecific skin eruption: Secondary | ICD-10-CM | POA: Diagnosis not present

## 2016-11-06 DIAGNOSIS — K219 Gastro-esophageal reflux disease without esophagitis: Secondary | ICD-10-CM | POA: Diagnosis not present

## 2016-11-25 ENCOUNTER — Ambulatory Visit
Admission: RE | Admit: 2016-11-25 | Discharge: 2016-11-25 | Disposition: A | Discharge status: Home / Self Care | Payer: Medicare Other | Source: Ambulatory Visit | Attending: Internal Medicine | Admitting: Internal Medicine

## 2016-11-25 DIAGNOSIS — M85852 Other specified disorders of bone density and structure, left thigh: Secondary | ICD-10-CM | POA: Diagnosis not present

## 2016-11-25 DIAGNOSIS — E2839 Other primary ovarian failure: Secondary | ICD-10-CM

## 2016-11-25 DIAGNOSIS — Z1231 Encounter for screening mammogram for malignant neoplasm of breast: Secondary | ICD-10-CM | POA: Diagnosis not present

## 2016-11-25 DIAGNOSIS — Z780 Asymptomatic menopausal state: Secondary | ICD-10-CM | POA: Diagnosis not present

## 2016-12-04 DIAGNOSIS — H269 Unspecified cataract: Secondary | ICD-10-CM | POA: Diagnosis not present

## 2016-12-19 DIAGNOSIS — H10413 Chronic giant papillary conjunctivitis, bilateral: Secondary | ICD-10-CM | POA: Diagnosis not present

## 2016-12-19 DIAGNOSIS — H35372 Puckering of macula, left eye: Secondary | ICD-10-CM | POA: Diagnosis not present

## 2016-12-19 DIAGNOSIS — H2513 Age-related nuclear cataract, bilateral: Secondary | ICD-10-CM | POA: Diagnosis not present

## 2016-12-29 ENCOUNTER — Encounter: Payer: Self-pay | Admitting: Gastroenterology

## 2016-12-29 ENCOUNTER — Ambulatory Visit (INDEPENDENT_AMBULATORY_CARE_PROVIDER_SITE_OTHER): Payer: Medicare Other | Admitting: Gastroenterology

## 2016-12-29 VITALS — BP 142/70 | HR 64 | Ht 64.0 in | Wt 152.4 lb

## 2016-12-29 DIAGNOSIS — Z1211 Encounter for screening for malignant neoplasm of colon: Secondary | ICD-10-CM | POA: Diagnosis not present

## 2016-12-29 DIAGNOSIS — M349 Systemic sclerosis, unspecified: Secondary | ICD-10-CM | POA: Diagnosis not present

## 2016-12-29 DIAGNOSIS — R1319 Other dysphagia: Secondary | ICD-10-CM

## 2016-12-29 MED ORDER — NA SULFATE-K SULFATE-MG SULF 17.5-3.13-1.6 GM/177ML PO SOLN: ORAL | 0 refills | Status: DC

## 2016-12-29 MED ORDER — PANTOPRAZOLE SODIUM 40 MG PO TBEC: 40.0000 mg | DELAYED_RELEASE_TABLET | Freq: Two times a day (BID) | ORAL | 6 refills | Status: DC

## 2016-12-29 NOTE — Patient Instructions (Signed)
You have been scheduled for an endoscopy and colonoscopy. Please follow the written instructions given to you at your visit today. Please pick up your prep supplies at the pharmacy within the next 1-3 days. If you use inhalers (even only as needed), please bring them with you on the day of your procedure. Your physician has requested that you go to www.startemmi.com and enter the access code given to you at your visit today. This web site gives a general overview about your procedure. However, you should still follow specific instructions given to you by our office regarding your preparation for the procedure.  We will ger Echo report from Dr Nadyne Coombes to review  Take protonix 40 mg twice a day before breakfast and dinner   Gastroesophageal Reflux Disease, Adult Normally, food travels down the esophagus and stays in the stomach to be digested. However, when a person has gastroesophageal reflux disease (GERD), food and stomach acid move back up into the esophagus. When this happens, the esophagus becomes sore and inflamed. Over time, GERD can create small holes (ulcers) in the lining of the esophagus. What are the causes? This condition is caused by a problem with the muscle between the esophagus and the stomach (lower esophageal sphincter, or LES). Normally, the LES muscle closes after food passes through the esophagus to the stomach. When the LES is weakened or abnormal, it does not close properly, and that allows food and stomach acid to go back up into the esophagus. The LES can be weakened by certain dietary substances, medicines, and medical conditions, including:  Tobacco use.  Pregnancy.  Having a hiatal hernia.  Heavy alcohol use.  Certain foods and beverages, such as coffee, chocolate, onions, and peppermint.  What increases the risk? This condition is more likely to develop in:  People who have an increased body weight.  People who have connective tissue disorders.  People who  use NSAID medicines.  What are the signs or symptoms? Symptoms of this condition include:  Heartburn.  Difficult or painful swallowing.  The feeling of having a lump in the throat.  Abitter taste in the mouth.  Bad breath.  Having a large amount of saliva.  Having an upset or bloated stomach.  Belching.  Chest pain.  Shortness of breath or wheezing.  Ongoing (chronic) cough or a night-time cough.  Wearing away of tooth enamel.  Weight loss.  Different conditions can cause chest pain. Make sure to see your health care provider if you experience chest pain. How is this diagnosed? Your health care provider will take a medical history and perform a physical exam. To determine if you have mild or severe GERD, your health care provider may also monitor how you respond to treatment. You may also have other tests, including:  An endoscopy toexamine your stomach and esophagus with a small camera.  A test thatmeasures the acidity level in your esophagus.  A test thatmeasures how much pressure is on your esophagus.  A barium swallow or modified barium swallow to show the shape, size, and functioning of your esophagus.  How is this treated? The goal of treatment is to help relieve your symptoms and to prevent complications. Treatment for this condition may vary depending on how severe your symptoms are. Your health care provider may recommend:  Changes to your diet.  Medicine.  Surgery.  Follow these instructions at home: Diet  Follow a diet as recommended by your health care provider. This may involve avoiding foods and drinks such as: ?  Coffee and tea (with or without caffeine). ? Drinks that containalcohol. ? Energy drinks and sports drinks. ? Carbonated drinks or sodas. ? Chocolate and cocoa. ? Peppermint and mint flavorings. ? Garlic and onions. ? Horseradish. ? Spicy and acidic foods, including peppers, chili powder, curry powder, vinegar, hot sauces,  and barbecue sauce. ? Citrus fruit juices and citrus fruits, such as oranges, lemons, and limes. ? Tomato-based foods, such as red sauce, chili, salsa, and pizza with red sauce. ? Fried and fatty foods, such as donuts, french fries, potato chips, and high-fat dressings. ? High-fat meats, such as hot dogs and fatty cuts of red and white meats, such as rib eye steak, sausage, ham, and bacon. ? High-fat dairy items, such as whole milk, butter, and cream cheese.  Eat small, frequent meals instead of large meals.  Avoid drinking large amounts of liquid with your meals.  Avoid eating meals during the 2-3 hours before bedtime.  Avoid lying down right after you eat.  Do not exercise right after you eat. General instructions  Pay attention to any changes in your symptoms.  Take over-the-counter and prescription medicines only as told by your health care provider. Do not take aspirin, ibuprofen, or other NSAIDs unless your health care provider told you to do so.  Do not use any tobacco products, including cigarettes, chewing tobacco, and e-cigarettes. If you need help quitting, ask your health care provider.  Wear loose-fitting clothing. Do not wear anything tight around your waist that causes pressure on your abdomen.  Raise (elevate) the head of your bed 6 inches (15cm).  Try to reduce your stress, such as with yoga or meditation. If you need help reducing stress, ask your health care provider.  If you are overweight, reduce your weight to an amount that is healthy for you. Ask your health care provider for guidance about a safe weight loss goal.  Keep all follow-up visits as told by your health care provider. This is important. Contact a health care provider if:  You have new symptoms.  You have unexplained weight loss.  You have difficulty swallowing, or it hurts to swallow.  You have wheezing or a persistent cough.  Your symptoms do not improve with treatment.  You have a  hoarse voice. Get help right away if:  You have pain in your arms, neck, jaw, teeth, or back.  You feel sweaty, dizzy, or light-headed.  You have chest pain or shortness of breath.  You vomit and your vomit looks like blood or coffee grounds.  You faint.  Your stool is bloody or black.  You cannot swallow, drink, or eat. This information is not intended to replace advice given to you by your health care provider. Make sure you discuss any questions you have with your health care provider. Document Released: 12/25/2004 Document Revised: 08/15/2015 Document Reviewed: 07/12/2014 Elsevier Interactive Patient Education  2017 [place] for Gastroesophageal Reflux Disease, Adult When you have gastroesophageal reflux disease (GERD), the foods you eat and your eating habits are very important. Choosing the right foods can help ease your discomfort. What guidelines do I need to follow?  Choose fruits, vegetables, whole grains, and low-fat dairy products.  Choose low-fat meat, fish, and poultry.  Limit fats such as oils, salad dressings, butter, nuts, and avocado.  Keep a food diary. This helps you identify foods that cause symptoms.  Avoid foods that cause symptoms. These may be different for everyone.  Eat small meals  often instead of 3 large meals a day.  Eat your meals slowly, in a place where you are relaxed.  Limit fried foods.  Cook foods using methods other than frying.  Avoid drinking alcohol.  Avoid drinking large amounts of liquids with your meals.  Avoid bending over or lying down until 2-3 hours after eating. What foods are not recommended? These are some foods and drinks that may make your symptoms worse: Vegetables Tomatoes. Tomato juice. Tomato and spaghetti sauce. Chili peppers. Onion and garlic. Horseradish. Fruits Oranges, grapefruit, and lemon (fruit and juice). Meats High-fat meats, fish, and poultry. This includes hot dogs, ribs,  ham, sausage, salami, and bacon. Dairy Whole milk and chocolate milk. Sour cream. Cream. Butter. Ice cream. Cream cheese. Drinks Coffee and tea. Bubbly (carbonated) drinks or energy drinks. Condiments Hot sauce. Barbecue sauce. Sweets/Desserts Chocolate and cocoa. Donuts. Peppermint and spearmint. Fats and Oils High-fat foods. This includes [place] fries and potato chips. Other Vinegar. Strong spices. This includes black pepper, white pepper, red pepper, cayenne, curry powder, cloves, ginger, and chili powder. The items listed above may not be a complete list of foods and drinks to avoid. Contact your dietitian for more information. This information is not intended to replace advice given to you by your health care provider. Make sure you discuss any questions you have with your health care provider. Document Released: 09/16/2011 Document Revised: 08/23/2015 Document Reviewed: 01/19/2013 Elsevier Interactive Patient Education  2017 [employer].

## 2016-12-29 NOTE — Progress Notes (Signed)
Nancy Lambert    [id number]    February 03, 1941  Primary Care Physician:Sanders, Bailey Mech, MD  Referring Physician: Glendale Chard, [address] [place], [place] [address]  Chief complaint:  Dysphagia HPI: 76 yr F with history of scleroderma, cardiomegaly, mild pulmonary hypertension here for evaluation with complaints of dysphagia. Patient has been having progressive difficulty swallowing mostly with pills and solid food for the past 4-6 months. She also has difficulty with liquids intermittently with is mostly worse with solids. She has regurgitation of food and pills . Heartburn improved with Protonix . She has chronic cough worse mostly at bedtime. She was following with Dr. Wynetta Emery in the remote past. No history of recent EGD. She never had colonoscopy for colorectal cancer screening. Denies any nausea, vomiting, abdominal pain, melena or bright red blood per rectum No family history of GI malignancy. Pulmonary hypertension, is mild and is maintained on Adempas. Patient has good functional status and is able to walk up 1 to 2 flights of stairs with no shortness of breath or chest pain.   Outpatient Encounter Prescriptions as of 12/29/2016  Medication Sig  . aspirin EC 81 MG tablet Take 81 mg by mouth daily.  . famotidine (PEPCID) 20 MG tablet One at bedtime  . pantoprazole (PROTONIX) 40 MG tablet Take 1 tablet (40 mg total) by mouth daily. Take 30-60 min before first meal of the day  . Riociguat (ADEMPAS) 1.5 MG TABS Take 1 tablet by mouth 3 (three) times daily.  . [DISCONTINUED] gabapentin (NEURONTIN) 300 MG capsule Take 300 mg by mouth 2 (two) times daily.  . [DISCONTINUED] simethicone (MYLICON) 80 MG chewable tablet Chew 80 mg by mouth every 6 (six) hours as needed for flatulence.   No facility-administered encounter medications on file as of 12/29/2016.     Allergies as of 12/29/2016  . (No Known Allergies)    Past Medical History:  Diagnosis Date  .  Anxiety   . Cardiac arrhythmia   . Glaucoma   . Hypertension   . Scleroderma [facility])     Past Surgical History:  Procedure Laterality Date  . CARDIAC CATHETERIZATION N/A 07/24/2015   Procedure: Right Heart Cath;  Surgeon: Adrian Prows, MD;  Location: [facility];  Service: Cardiovascular;  Laterality: N/A;  . VESICOVAGINAL FISTULA CLOSURE W/ TAH      Family History  Problem Relation Age of Onset  . Breast cancer Mother   . Clotting disorder Mother   . Diabetes Mother   . Breast cancer Sister   . Clotting disorder Sister   . Prostate cancer Son   . Breast cancer Daughter   . Other Father        drowned    Social History   Social History  . Marital status: Widowed    Spouse name: N/A  . Number of children: 4  . Years of education: N/A   Occupational History  . Retired    Social History Main Topics  . Smoking status: Former Smoker    Types: Cigarettes    Quit date: 1  . Smokeless tobacco: Never Used  . Alcohol use 0.0 oz/week     Comment: occasional  . Drug use: No  . Sexual activity: Not on file   Other Topics Concern  . Not on file   Social History Narrative  . No narrative on file      Review of systems: Review of Systems  Constitutional: Negative for fever and chills.  positive for fatigue HENT: Negative.   Eyes: Negative for blurred vision.  Respiratory: Positive for cough, shortness of breath and wheezing.   Cardiovascular: Negative for chest pain and palpitations.  Gastrointestinal: as per HPI Genitourinary: Negative for dysuria, urgency, frequency and hematuria.  Musculoskeletal: Negative for myalgias, back pain and joint pain.  Skin: Negative for itching and rash.  Neurological: Negative for dizziness, tremors, focal weakness, seizures and loss of consciousness.  Endo/Heme/Allergies: Negative for seasonal allergies.  Psychiatric/Behavioral: Negative for depression, suicidal ideas and hallucinations.  positive for anxiety All other  systems reviewed and are negative.   Physical Exam: Vitals:   12/29/16 0946  BP: (!) 142/70  Pulse: 64   Body mass index is 26.16 kg/m. Gen:      No acute distress HEENT:  EOMI, sclera anicteric Neck:     No masses; no thyromegaly Lungs:    Clear to auscultation bilaterally; normal respiratory effort CV:         Regular rate and rhythm; Systolic murmur+ Abd:      + bowel sounds; soft, non-tender; no palpable masses, no distension Ext:    + Trace edema; adequate peripheral perfusion, sclerodactyly Skin:      Warm and dry; no rash Neuro: alert and oriented x 3 Psych: normal mood and affect  Data Reviewed:  Reviewed labs, radiology imaging, old records and pertinent past GI work up   Assessment and Plan/Recommendations:  76 year old female with history of scleroderma, mild pulmonary hypertension, chronic cough, GERD here with complaints of progressive worsening of solid and pill dysphagia We'll schedule for EGD to exclude peptic stricture, esophageal ring or severe reflux esophagitis and also possible esophageal dilation if needed Increase Protonix to 40 mg twice daily, 30 minutes before breakfast and dinner History blocker at bedtime as needed Discussed antireflux measures detail  Colorectal cancer screening: Patient is past due, never had colonoscopy We'll schedule colonoscopy along with EGD The risks and benefits as well as alternatives of endoscopic procedure(s) have been discussed and reviewed. All questions answered. The patient agrees to proceed.  We will request records of recent echocardiogram done by Dr. Einar Gip about 5-6 months ago to review prior to proceeding with procedures  K. Denzil Magnuson , MD [phone] Mon-Fri 8a-5p [phone] after 5p, weekends, holidays  CC: Glendale Chard, MD

## 2017-01-05 ENCOUNTER — Telehealth: Payer: Self-pay | Admitting: *Deleted

## 2017-01-05 NOTE — Telephone Encounter (Signed)
Faxed release for ECHO report on 10/1 and 10/8  Received report today  Put on Dr Jillyn Hidden desk for review

## 2017-01-13 ENCOUNTER — Encounter: Payer: Self-pay | Admitting: Gastroenterology

## 2017-01-20 DIAGNOSIS — I270 Primary pulmonary hypertension: Secondary | ICD-10-CM | POA: Diagnosis not present

## 2017-01-20 DIAGNOSIS — R0602 Shortness of breath: Secondary | ICD-10-CM | POA: Diagnosis not present

## 2017-01-27 ENCOUNTER — Encounter: Payer: Self-pay | Admitting: Gastroenterology

## 2017-01-27 ENCOUNTER — Ambulatory Visit (AMBULATORY_SURGERY_CENTER): Payer: Medicare Other | Admitting: Gastroenterology

## 2017-01-27 VITALS — BP 147/72 | HR 70 | Temp 97.1°F | Resp 14 | Ht 64.0 in | Wt 152.0 lb

## 2017-01-27 DIAGNOSIS — R1310 Dysphagia, unspecified: Secondary | ICD-10-CM

## 2017-01-27 DIAGNOSIS — Z538 Procedure and treatment not carried out for other reasons: Secondary | ICD-10-CM | POA: Diagnosis not present

## 2017-01-27 DIAGNOSIS — Z1211 Encounter for screening for malignant neoplasm of colon: Secondary | ICD-10-CM

## 2017-01-27 DIAGNOSIS — K219 Gastro-esophageal reflux disease without esophagitis: Secondary | ICD-10-CM | POA: Diagnosis not present

## 2017-01-27 DIAGNOSIS — K228 Other specified diseases of esophagus: Secondary | ICD-10-CM

## 2017-01-27 DIAGNOSIS — Z1212 Encounter for screening for malignant neoplasm of rectum: Secondary | ICD-10-CM

## 2017-01-27 MED ORDER — SODIUM CHLORIDE 0.9 % IV SOLN: 500.0000 mL | INTRAVENOUS | Status: DC

## 2017-01-27 MED ORDER — OMEPRAZOLE 40 MG PO CPDR: 40.0000 mg | DELAYED_RELEASE_CAPSULE | Freq: Every day | ORAL | 3 refills | Status: DC

## 2017-01-27 NOTE — Progress Notes (Signed)
To PACU, VSS. Report to [email or web address] 

## 2017-01-27 NOTE — Op Note (Signed)
[facility] Patient Name: Nancy Lambert Procedure Date: 01/27/2017 3:18 PM MRN: [id number] Endoscopist: Mauri Pole , MD Age: 76 Referring MD:  Date of Birth: 09-Feb-1941 Gender: Female Account #: [id number] Procedure:                Upper GI endoscopy Indications:              Dysphagia Medicines:                Monitored Anesthesia Care Procedure:                Pre-Anesthesia Assessment:                           - Prior to the procedure, a History and Physical                            was performed, and patient medications and                            allergies were reviewed. The patient's tolerance of                            previous anesthesia was also reviewed. The risks                            and benefits of the procedure and the sedation                            options and risks were discussed with the patient.                            All questions were answered, and informed consent                            was obtained. Prior Anticoagulants: The patient has                            taken no previous anticoagulant or antiplatelet                            agents. ASA Grade Assessment: II - A patient with                            mild systemic disease. After reviewing the risks                            and benefits, the patient was deemed in                            satisfactory condition to undergo the procedure.                           After obtaining informed consent, the endoscope was  passed under direct vision. Throughout the                            procedure, the patient's blood pressure, pulse, and                            oxygen saturations were monitored continuously. The                            Model GIF-[id number]) scope was introduced                            through the mouth, and advanced to the second part                            of duodenum. The upper GI endoscopy  was                            accomplished without difficulty. The patient                            tolerated the procedure well. Scope In: Scope Out: Findings:                 The lumen of the esophagus was moderately dilated                            with retained secretions and fluid. No visible                            stricture.                           The esophagus and gastroesophageal junction were                            examined with white light and narrow band imaging                            (NBI) from a forward view and retroflexed position.                            There were esophageal mucosal changes suspicious                            for short-segment Barrett's esophagus. These                            changes involved the mucosa at the upper extent of                            the gastric folds (42 cm from the incisors)                            extending to the Z-line (  39 cm from the incisors).                            Three tongues of salmon-colored mucosa were present                            from 39 to 40 cm. The maximum longitudinal extent                            of these esophageal mucosal changes was 3 cm in                            length. Mucosa was biopsied with a cold forceps for                            histology. One specimen bottle was sent to                            pathology.                           The stomach was normal.                           The examined duodenum was normal. Complications:            No immediate complications. Estimated Blood Loss:     Estimated blood loss was minimal. Impression:               - Dilation in the entire esophagus.                           - Esophageal mucosal changes suspicious for                            short-segment Barrett's esophagus. Biopsied.                           - Normal stomach.                           - Normal examined duodenum. Recommendation:            - Patient has a contact number available for                            emergencies. The signs and symptoms of potential                            delayed complications were discussed with the                            patient. Return to normal activities tomorrow.                            Written discharge instructions were provided to the  patient.                           - Resume previous diet.                           - Continue present medications.                           - Follow an antireflux regimen indefinitely.                           - Use Prilosec (omeprazole) 40 mg PO daily                            indefinitely.                           - Use Pepcid (famotidine) 20 mg PO daily                            indefinitely.                           - Await pathology results.                           - Return to GI clinic in 6 months. Mauri Pole, MD 01/27/2017 3:45:33 PM This report has been signed electronically.

## 2017-01-27 NOTE — Op Note (Signed)
[facility] Patient Name: Nancy Lambert Procedure Date: 01/27/2017 3:18 PM MRN: [id number] Endoscopist: Mauri Pole , MD Age: 76 Referring MD:  Date of Birth: 04-Sep-1940 Gender: Female Account #: [id number] Procedure:                Colonoscopy Indications:              Screening for colorectal malignant neoplasm Medicines:                Monitored Anesthesia Care Procedure:                Pre-Anesthesia Assessment:                           - Prior to the procedure, a History and Physical                            was performed, and patient medications and                            allergies were reviewed. The patient's tolerance of                            previous anesthesia was also reviewed. The risks                            and benefits of the procedure and the sedation                            options and risks were discussed with the patient.                            All questions were answered, and informed consent                            was obtained. Prior Anticoagulants: The patient has                            taken no previous anticoagulant or antiplatelet                            agents. ASA Grade Assessment: III - A patient with                            severe systemic disease. After reviewing the risks                            and benefits, the patient was deemed in                            satisfactory condition to undergo the procedure.                           After obtaining informed consent, the colonoscope  was passed under direct vision. Throughout the                            procedure, the patient's blood pressure, pulse, and                            oxygen saturations were monitored continuously. The                            Colonoscope was introduced through the anus and                            advanced to the the sigmoid colon for evaluation.                            This was  the intended extent. The colonoscopy was                            technically difficult and complex due to inadequate                            bowel prep. The patient tolerated the procedure                            well. The quality of the bowel preparation was                            poor. The rectum was photographed. Scope In: 3:34:20 PM Scope Out: 3:35:54 PM Total Procedure Duration: 0 hours 1 minute 34 seconds  Findings:                 The perianal and digital rectal examinations were                            normal.                           A moderate amount of stool was found in the rectum                            and in the sigmoid colon, precluding visualization. Complications:            No immediate complications. Estimated Blood Loss:     Estimated blood loss: none. Impression:               - Preparation of the colon was poor.                           - Stool in the rectum and in the sigmoid colon.                           - No specimens collected. Recommendation:           - Patient has a contact number available for  emergencies. The signs and symptoms of potential                            delayed complications were discussed with the                            patient. Return to normal activities tomorrow.                            Written discharge instructions were provided to the                            patient.                           - Resume previous diet.                           - Continue present medications.                           - Repeat colonoscopy at the next available                            appointment because the bowel preparation was poor.                           - For future colonoscopy the patient will require                            an extended preparation. If there are any                            questions, please contact the gastroenterologist. Mauri Pole, MD 01/27/2017  3:47:49 PM This report has been signed electronically.

## 2017-01-27 NOTE — Patient Instructions (Addendum)
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  If the procedure report does not answer your questions, please call your gastroenterologist to clarify.  If you requested that your care partner not be given the details of your procedure findings, then the procedure report has been included in a sealed envelope for you to review at your convenience later.  YOU SHOULD EXPECT: Some feelings of bloating in the abdomen. Passage of more gas than usual.  Walking can help get rid of the air that was put into your GI tract during the procedure and reduce the bloating. If you had a lower endoscopy (such as a colonoscopy or flexible sigmoidoscopy) you may notice spotting of blood in your stool or on the toilet paper. If you underwent a bowel prep for your procedure, you may not have a normal bowel movement for a few days.  Please Note:  You might notice some irritation and congestion in your nose or some drainage.  This is from the oxygen used during your procedure.  There is no need for concern and it should clear up in a day or so.  SYMPTOMS TO REPORT IMMEDIATELY:   Following lower endoscopy (colonoscopy or flexible sigmoidoscopy):  Excessive amounts of blood in the stool  Significant tenderness or worsening of abdominal pains  Swelling of the abdomen that is new, acute  Fever of 100F or  higher   Following upper endoscopy (EGD)  Vomiting of blood or coffee ground material  New chest pain or pain under the shoulder blades  Painful or persistently difficult swallowing  New shortness of breath  Fever of 100F or higher  Black, tarry-looking stools  For urgent or emergent issues, a gastroenterologist can be reached at any hour by calling [phone].   DIET:  We do recommend a small meal at first, but then you may proceed to your regular diet.  Drink plenty of fluids but you should avoid alcoholic beverages for 24 hours.  ACTIVITY:  You should plan to take it easy for the rest of today and you should NOT DRIVE or use heavy machinery until tomorrow (because of the sedation medicines used during the test).    FOLLOW UP: Our staff will call the number listed on your records the next business day following your procedure to check on you and address any questions or concerns that you may have regarding the information given to you following your procedure. If we do not reach you, we will leave a message.  However, if you are feeling well and you are not experiencing any problems, there is no need to return our call.  We will assume that you have returned to your regular daily activities without incident.  If any biopsies were taken you will be contacted by phone or by letter within the next 1-3 weeks.  Please call us at [phone] if  you have not heard about the biopsies in 3 weeks.    SIGNATURES/CONFIDENTIALITY: You and/or your care partner have signed paperwork which will be entered into your electronic medical record.  These signatures attest to the fact that that the information above on your After Visit Summary has been reviewed and is understood.  Full responsibility of the confidentiality of this discharge information lies with you and/or your care-partner.

## 2017-01-27 NOTE — Progress Notes (Signed)
Called to room to assist during endoscopic procedure.  Patient ID and intended procedure confirmed with present staff. Received instructions for my participation in the procedure from the performing physician.  

## 2017-01-28 ENCOUNTER — Telehealth: Payer: Self-pay

## 2017-01-28 NOTE — Telephone Encounter (Signed)
  Follow up Call-  Call back number 01/27/2017  Post procedure Call Back phone  # [phone]  Permission to leave phone message Yes  Some recent data might be hidden     Patient questions:  Do you have a fever, pain , or abdominal swelling? No. Pain Score  0 *  Have you tolerated food without any problems? Yes.    Have you been able to return to your normal activities? Yes.    Do you have any questions about your discharge instructions: Diet   No. Medications  No. Follow up visit  No.  Do you have questions or concerns about your Care? No.  Actions: * If pain score is 4 or above: No action needed, pain <4.

## 2017-01-30 NOTE — Telephone Encounter (Signed)
Dr Silverio Decamp I had put this report on your desk for review do you remember seeing this patients ECHO report ?

## 2017-01-30 NOTE — Telephone Encounter (Signed)
Reviewed it. Thanks

## 2017-02-04 ENCOUNTER — Ambulatory Visit (AMBULATORY_SURGERY_CENTER): Payer: Self-pay | Admitting: *Deleted

## 2017-02-04 ENCOUNTER — Encounter: Payer: Self-pay | Admitting: Gastroenterology

## 2017-02-04 VITALS — Ht 64.0 in | Wt 152.0 lb

## 2017-02-04 DIAGNOSIS — Z1211 Encounter for screening for malignant neoplasm of colon: Secondary | ICD-10-CM

## 2017-02-04 MED ORDER — NA SULFATE-K SULFATE-MG SULF 17.5-3.13-1.6 GM/177ML PO SOLN: ORAL | 0 refills | Status: DC

## 2017-02-04 NOTE — Progress Notes (Signed)
Patient denies any allergies to eggs or soy. Patient denies any problems with anesthesia/sedation. Patient denies any oxygen use at home. Patient denies taking any diet/weight loss medications or blood thinners.  2 day prep given.

## 2017-02-06 ENCOUNTER — Encounter: Payer: Self-pay | Admitting: Gastroenterology

## 2017-02-12 ENCOUNTER — Telehealth: Payer: Self-pay | Admitting: Gastroenterology

## 2017-02-12 NOTE — Telephone Encounter (Signed)
Spoke with the patient about her biopsy results. Also discussed ways to assist swallowing her medications because they feel like they are "sticking". She will try putting them in a spoonful of jello, pudding, applesauce or ice cream.

## 2017-02-13 ENCOUNTER — Other Ambulatory Visit: Payer: Self-pay

## 2017-02-13 ENCOUNTER — Encounter: Payer: Self-pay | Admitting: Gastroenterology

## 2017-02-13 ENCOUNTER — Ambulatory Visit (AMBULATORY_SURGERY_CENTER): Payer: Medicare Other | Admitting: Gastroenterology

## 2017-02-13 VITALS — BP 118/70 | HR 63 | Temp 97.8°F | Resp 19 | Ht 64.0 in | Wt 152.0 lb

## 2017-02-13 DIAGNOSIS — Z1211 Encounter for screening for malignant neoplasm of colon: Secondary | ICD-10-CM | POA: Diagnosis present

## 2017-02-13 DIAGNOSIS — D122 Benign neoplasm of ascending colon: Secondary | ICD-10-CM

## 2017-02-13 DIAGNOSIS — Z1212 Encounter for screening for malignant neoplasm of rectum: Secondary | ICD-10-CM

## 2017-02-13 DIAGNOSIS — D125 Benign neoplasm of sigmoid colon: Secondary | ICD-10-CM

## 2017-02-13 MED ORDER — SODIUM CHLORIDE 0.9 % IV SOLN: 500.0000 mL | INTRAVENOUS | Status: DC

## 2017-02-13 NOTE — Progress Notes (Signed)
Pt's states no medical or surgical changes since previsit or office visit. 

## 2017-02-13 NOTE — Progress Notes (Signed)
Report given to PACU, vss 

## 2017-02-13 NOTE — Progress Notes (Signed)
Called to room to assist during endoscopic procedure.  Patient ID and intended procedure confirmed with present staff. Received instructions for my participation in the procedure from the performing physician.  

## 2017-02-13 NOTE — Patient Instructions (Signed)
Handouts given : Polyps and Diverticulosis.  YOU HAD AN ENDOSCOPIC PROCEDURE TODAY AT THE [facility]:   Refer to the procedure report that was given to you for any specific questions about what was found during the examination.  If the procedure report does not answer your questions, please call your gastroenterologist to clarify.  If you requested that your care partner not be given the details of your procedure findings, then the procedure report has been included in a sealed envelope for you to review at your convenience later.  YOU SHOULD EXPECT: Some feelings of bloating in the abdomen. Passage of more gas than usual.  Walking can help get rid of the air that was put into your GI tract during the procedure and reduce the bloating. If you had a lower endoscopy (such as a colonoscopy or flexible sigmoidoscopy) you may notice spotting of blood in your stool or on the toilet paper. If you underwent a bowel prep for your procedure, you may not have a normal bowel movement for a few days.  Please Note:  You might notice some irritation and congestion in your nose or some drainage.  This is from the oxygen used during your procedure.  There is no need for concern and it should clear up in a day or so.  SYMPTOMS TO REPORT IMMEDIATELY:   Following lower endoscopy (colonoscopy or flexible sigmoidoscopy):  Excessive amounts of blood in the stool  Significant tenderness or worsening of abdominal pains  Swelling of the abdomen that is new, acute  Fever of 100F or higher    For urgent or emergent issues, a gastroenterologist can be reached at any hour by calling [phone].   DIET:  We do recommend a small meal at first, but then you may proceed to your regular diet.  Drink plenty of fluids but you should avoid alcoholic beverages for 24 hours.  ACTIVITY:  You should plan to take it easy for the rest of today and you should NOT DRIVE or use heavy machinery until tomorrow (because of  the sedation medicines used during the test).    FOLLOW UP: Our staff will call the number listed on your records the next business day following your procedure to check on you and address any questions or concerns that you may have regarding the information given to you following your procedure. If we do not reach you, we will leave a message.  However, if you are feeling well and you are not experiencing any problems, there is no need to return our call.  We will assume that you have returned to your regular daily activities without incident.  If any biopsies were taken you will be contacted by phone or by letter within the next 1-3 weeks.  Please call us at [phone] if you have not heard about the biopsies in 3 weeks.    SIGNATURES/CONFIDENTIALITY: You and/or your care partner have signed paperwork which will be entered into your electronic medical record.  These signatures attest to the fact that that the information above on your After Visit Summary has been reviewed and is understood.  Full responsibility of the confidentiality of this discharge information lies with you and/or your care-partner. 

## 2017-02-13 NOTE — Op Note (Signed)
[facility] Patient Name: Nancy Lambert Procedure Date: 02/13/2017 1:21 PM MRN: [id number] Endoscopist: Mauri Pole , MD Age: 76 Referring MD:  Date of Birth: [birth year] Gender: Female Account #: [id number] Procedure:                Colonoscopy Indications:              Screening for colorectal malignant neoplasm Medicines:                Monitored Anesthesia Care Procedure:                Pre-Anesthesia Assessment:                           - Prior to the procedure, a History and Physical                            was performed, and patient medications and                            allergies were reviewed. The patient's tolerance of                            previous anesthesia was also reviewed. The risks                            and benefits of the procedure and the sedation                            options and risks were discussed with the patient.                            All questions were answered, and informed consent                            was obtained. Prior Anticoagulants: The patient has                            taken no previous anticoagulant or antiplatelet                            agents. ASA Grade Assessment: II - A patient with                            mild systemic disease. After reviewing the risks                            and benefits, the patient was deemed in                            satisfactory condition to undergo the procedure.                           After obtaining informed consent, the colonoscope  was passed under direct vision. Throughout the                            procedure, the patient's blood pressure, pulse, and                            oxygen saturations were monitored continuously. The                            Model PCF-[id number]) scope was introduced                            through the anus and advanced to the the cecum,                            identified  by appendiceal orifice and ileocecal                            valve. The colonoscopy was performed without                            difficulty. The patient tolerated the procedure                            well. The quality of the bowel preparation was                            adequate. The ileocecal valve, appendiceal orifice,                            and rectum were photographed. Scope In: 1:26:26 PM Scope Out: 2:03:54 PM Scope Withdrawal Time: 0 hours 26 minutes 46 seconds  Total Procedure Duration: 0 hours 37 minutes 28 seconds  Findings:                 The digital rectal exam findings include decreased                            sphincter tone. Pertinent negatives include no                            palpable rectal lesions.                           A 2 mm polyp was found in the ascending colon. The                            polyp was sessile. The polyp was removed with a                            cold biopsy forceps. Resection and retrieval were                            complete.  Multiple small and large-mouthed diverticula were                            found in the sigmoid colon, descending colon,                            transverse colon and ascending colon.                           Three sessile polyps were found in the sigmoid                            colon and ascending colon. The polyps were 4 to 7                            mm in size. These polyps were removed with a cold                            snare. Resection and retrieval were complete.                           A bleeding submucosal tear that occurred during the                            procedure was found in the recto-sigmoid colon                            while retroflexing in the rectum (narrow vault with                            poor rectal sphincter tone, unable to hold air).                            This measured 3 mm in length. To repair the defect,                             the tissue edges were approximated and three                            hemostatic clips were successfully placed (MR                            conditional). Closure of the defect was successful.                            There was no bleeding at the end of the procedure.                            Retroflexion was not performed in the rectum                            subsequently. Complications:  Tear Estimated Blood Loss:     Estimated blood loss was minimal. Impression:               - Decreased sphincter tone found on digital rectal                            exam.                           - One 2 mm polyp in the ascending colon, removed                            with a cold biopsy forceps. Resected and retrieved.                           - Diverticulosis in the sigmoid colon, in the                            descending colon, in the transverse colon and in                            the ascending colon.                           - Three 4 to 7 mm polyps in the sigmoid colon and                            in the ascending colon, removed with a cold snare.                            Resected and retrieved.                           - Submucosal tear in the recto-sigmoid colon. Clips                            (MR conditional) were placed. Recommendation:           - Patient has a contact number available for                            emergencies. The signs and symptoms of potential                            delayed complications were discussed with the                            patient. Return to normal activities tomorrow.                            Written discharge instructions were provided to the                            patient.                           -  Resume previous diet.                           - Continue present medications.                           - Await pathology results.                           - Repeat colonoscopy  in 3 - 5 years for                            surveillance based on pathology results. Mauri Pole, MD 02/13/2017 2:12:21 PM This report has been signed electronically.

## 2017-02-16 ENCOUNTER — Telehealth: Payer: Self-pay

## 2017-02-16 NOTE — Telephone Encounter (Signed)
  Follow up Call-  Call back number 02/13/2017 01/27/2017  Post procedure Call Back phone  # [phone]  Permission to leave phone message Yes Yes  Some recent data might be hidden     Patient questions:  Do you have a fever, pain , or abdominal swelling? Yes.   Pain Score  4 *  Have you tolerated food without any problems? Yes.    Have you been able to return to your normal activities? No.  Do you have any questions about your discharge instructions: Diet   No. Medications  No. Follow up visit  No.  Do you have questions or concerns about your Care? No.  Actions: * If pain score is 4 or above: No action needed, pain <4.  Per pt she did have some rectal bleeding through the weekend.  Pt up to the restroom this am, she reported no rectal bleeding.  Pt also reported that she had abdominal cramping through out the weekend.  Pt states, "I can hear the gas moving".  Pt said she only went down the stairs once this weekend.  She limited moving d/t the rectal bleeding.  I encourage the pt to move around today.  If she does not move the gas today, pt said she will call us back.  maw

## 2017-02-16 NOTE — Telephone Encounter (Signed)
Called [phone] and left a messaged we tried to reach pt for a follow up call. Left a message for the pt to call us back if she dosen't feel better and passing her flatus. maw

## 2017-02-23 ENCOUNTER — Telehealth: Payer: Self-pay | Admitting: Gastroenterology

## 2017-02-23 NOTE — Telephone Encounter (Signed)
Colonoscopy from Nov 2018 reviewed She had a small rectal tear and had clips placed.  I would not think this is impacting her constipation, but would avoid enemas. I would recommend that she begin MiraLax 17g, would dose x 3 on day one and then BID until BMs resume Will also alert Dr. Silverio Decamp as to this note so that she can follow-up appropriately.

## 2017-02-23 NOTE — Telephone Encounter (Signed)
I have instructed the patient. She agrees to this plan and will call back with update or if she fails to improve.

## 2017-02-23 NOTE — Telephone Encounter (Signed)
Doc of the Day Patient states she has not had a good bowel movement since her procedure on 02/13/17. She denies abdominal pain. She is passing gas. She states she "really haven't had time to sit down and have a bowel movement either." The patient is caring for a sick daughter who has cancer. Okay to do Miralax purge or enema?

## 2017-02-24 ENCOUNTER — Encounter: Payer: Self-pay | Admitting: Gastroenterology

## 2017-02-24 NOTE — Telephone Encounter (Signed)
Thank Ulice Dash. Called patient, couldn't reach left a message

## 2017-02-25 NOTE — Telephone Encounter (Signed)
Called the patient. No answer. Left a message advising this is a follow up call. If she was still having issues to please call and ask for the nurse.

## 2017-02-27 NOTE — Telephone Encounter (Signed)
Spoke with the patient. She is using Miralax. She admits she is unable to take care of herself right now because she is focused on her child with cancer.

## 2017-05-22 DIAGNOSIS — I10 Essential (primary) hypertension: Secondary | ICD-10-CM | POA: Diagnosis not present

## 2017-05-22 DIAGNOSIS — I270 Primary pulmonary hypertension: Secondary | ICD-10-CM | POA: Diagnosis not present

## 2017-05-22 DIAGNOSIS — R0602 Shortness of breath: Secondary | ICD-10-CM | POA: Diagnosis not present

## 2017-06-15 DIAGNOSIS — J209 Acute bronchitis, unspecified: Secondary | ICD-10-CM | POA: Diagnosis not present

## 2017-07-16 ENCOUNTER — Encounter: Payer: Self-pay | Admitting: Gastroenterology

## 2017-07-22 ENCOUNTER — Telehealth: Payer: Self-pay | Admitting: Internal Medicine

## 2017-07-22 DIAGNOSIS — I270 Primary pulmonary hypertension: Secondary | ICD-10-CM | POA: Diagnosis not present

## 2017-07-22 NOTE — Telephone Encounter (Signed)
Left message for patient to call back  

## 2017-07-23 NOTE — Telephone Encounter (Signed)
Attempted to call patient, no answer, left message to call back.  

## 2017-07-27 ENCOUNTER — Telehealth: Payer: Self-pay | Admitting: Internal Medicine

## 2017-07-27 NOTE — Telephone Encounter (Signed)
lmtcb X3 for pt.  Will close encounter per triage protocol.  

## 2017-07-27 NOTE — Telephone Encounter (Signed)
In error

## 2017-12-02 DIAGNOSIS — R7303 Prediabetes: Secondary | ICD-10-CM | POA: Diagnosis not present

## 2017-12-02 DIAGNOSIS — M79606 Pain in leg, unspecified: Secondary | ICD-10-CM

## 2017-12-02 DIAGNOSIS — E559 Vitamin D deficiency, unspecified: Secondary | ICD-10-CM

## 2017-12-02 DIAGNOSIS — Z0000 Encounter for general adult medical examination without abnormal findings: Secondary | ICD-10-CM

## 2017-12-02 DIAGNOSIS — L940 Localized scleroderma [morphea]: Secondary | ICD-10-CM | POA: Diagnosis not present

## 2017-12-02 DIAGNOSIS — I272 Pulmonary hypertension, unspecified: Secondary | ICD-10-CM

## 2017-12-02 DIAGNOSIS — F4321 Adjustment disorder with depressed mood: Secondary | ICD-10-CM | POA: Diagnosis not present

## 2017-12-23 ENCOUNTER — Other Ambulatory Visit: Payer: Self-pay | Admitting: Cardiology

## 2017-12-23 ENCOUNTER — Ambulatory Visit
Admission: RE | Admit: 2017-12-23 | Discharge: 2017-12-23 | Disposition: A | Discharge status: Home / Self Care | Payer: Medicare Other | Source: Ambulatory Visit | Attending: Cardiology | Admitting: Cardiology

## 2017-12-23 DIAGNOSIS — R0602 Shortness of breath: Secondary | ICD-10-CM

## 2018-01-15 ENCOUNTER — Other Ambulatory Visit: Payer: Self-pay | Admitting: Internal Medicine

## 2018-01-15 DIAGNOSIS — Z1231 Encounter for screening mammogram for malignant neoplasm of breast: Secondary | ICD-10-CM

## 2018-01-28 ENCOUNTER — Ambulatory Visit
Admission: RE | Admit: 2018-01-28 | Discharge: 2018-01-28 | Disposition: A | Discharge status: Home / Self Care | Payer: Medicare Other | Source: Ambulatory Visit | Attending: Internal Medicine | Admitting: Internal Medicine

## 2018-01-28 DIAGNOSIS — Z1231 Encounter for screening mammogram for malignant neoplasm of breast: Secondary | ICD-10-CM

## 2018-02-09 ENCOUNTER — Telehealth (HOSPITAL_COMMUNITY): Payer: Self-pay | Admitting: *Deleted

## 2018-02-09 NOTE — Telephone Encounter (Signed)
Received referral for pt to participate in Pulmonary Rehab by Dr. Einar Gip diagnosis Pulmonary Hypertension. Requested most recent office note.Clinical review of pt follow up appt on 10/28 office note.   Pt appropriate for scheduling for Pulmonary rehab.  Will forward to support staff for scheduling and verification of insurance eligibility/benefits with pt  consent. Cherre Huger, BSN Cardiac and Training and development officer

## 2018-02-10 ENCOUNTER — Encounter (HOSPITAL_COMMUNITY): Payer: Self-pay | Admitting: *Deleted

## 2018-02-10 ENCOUNTER — Emergency Department (HOSPITAL_COMMUNITY): Payer: Medicare Other

## 2018-02-10 ENCOUNTER — Other Ambulatory Visit: Payer: Self-pay

## 2018-02-10 ENCOUNTER — Emergency Department (HOSPITAL_COMMUNITY)
Admission: EM | Admit: 2018-02-10 | Discharge: 2018-02-10 | Disposition: A | Discharge status: Home / Self Care | Payer: Medicare Other | Attending: Emergency Medicine | Admitting: Emergency Medicine

## 2018-02-10 DIAGNOSIS — I10 Essential (primary) hypertension: Secondary | ICD-10-CM | POA: Diagnosis not present

## 2018-02-10 DIAGNOSIS — Z79899 Other long term (current) drug therapy: Secondary | ICD-10-CM | POA: Diagnosis not present

## 2018-02-10 DIAGNOSIS — Z7982 Long term (current) use of aspirin: Secondary | ICD-10-CM | POA: Diagnosis not present

## 2018-02-10 DIAGNOSIS — M25511 Pain in right shoulder: Secondary | ICD-10-CM | POA: Diagnosis not present

## 2018-02-10 DIAGNOSIS — Z87891 Personal history of nicotine dependence: Secondary | ICD-10-CM | POA: Diagnosis not present

## 2018-02-10 DIAGNOSIS — R0789 Other chest pain: Secondary | ICD-10-CM | POA: Diagnosis not present

## 2018-02-10 LAB — BASIC METABOLIC PANEL
Anion gap: 8 (ref 5–15)
BUN: 10 mg/dL (ref 8–23)
CO2: 26 mmol/L (ref 22–32)
Calcium: 9.5 mg/dL (ref 8.9–10.3)
Chloride: 108 mmol/L (ref 98–111)
Creatinine, Ser: 0.85 mg/dL (ref 0.44–1.00)
GFR calc Af Amer: >60 mL/min (ref >60)
GFR calc non Af Amer: >60 mL/min (ref >60)
Glucose, Bld: 92 mg/dL (ref 70–99)
Potassium: 3.4 mmol/L — ABNORMAL LOW (ref 3.5–5.1)
Sodium: 142 mmol/L (ref 135–145)

## 2018-02-10 LAB — CBC
HCT: 40.7 % (ref 36.0–46.0)
Hemoglobin: 12.4 g/dL (ref 12.0–15.0)
MCH: 29.2 pg (ref 26.0–34.0)
MCHC: 30.5 g/dL (ref 30.0–36.0)
MCV: 96 fL (ref 80.0–100.0)
Platelets: 291 10*3/uL (ref 150–400)
RBC: 4.24 MIL/uL (ref 3.87–5.11)
RDW: 13.8 % (ref 11.5–15.5)
WBC: 4.1 10*3/uL (ref 4.0–10.5)
nRBC: 0 % (ref 0.0–0.2)

## 2018-02-10 LAB — I-STAT TROPONIN, ED: Troponin i, poc: 0.01 ng/mL (ref 0.00–0.08)

## 2018-02-10 MED ORDER — POTASSIUM CHLORIDE CRYS ER 20 MEQ PO TBCR
40.0000 meq | EXTENDED_RELEASE_TABLET | Freq: Once | ORAL | Status: AC
  Administered 2018-02-10: 40 meq via ORAL
  Filled 2018-02-10: qty 2

## 2018-02-10 MED ORDER — ACETAMINOPHEN 325 MG PO TABS
650.0000 mg | ORAL_TABLET | Freq: Once | ORAL | Status: AC
  Administered 2018-02-10: 650 mg via ORAL
  Filled 2018-02-10: qty 2

## 2018-02-10 NOTE — ED Triage Notes (Signed)
Pt reports CP started on Sunday with pain into Rt arm.

## 2018-02-10 NOTE — ED Notes (Signed)
ED Provider at bedside. 

## 2018-02-10 NOTE — ED Provider Notes (Signed)
[facility] EMERGENCY DEPARTMENT Provider Note   CSN: [id number] Arrival date & time: 02/10/18  1019     History   Chief Complaint Chief Complaint  Patient presents with  . Chest Pain    HPI Nancy Lambert is a 77 y.o. female.  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  Chief complaint is the right shoulder.  About 3 weeks ago she went to [facility] and had a flu shot in the right shoulder.  She states that hurt severely during the shot and had trouble with bleeding afterwards.  Even when she remove the Band-Aid later that day it started to bleed.  She is not sure if it swollen.  Since then the pain has been severe and she has limited range of motion.  About 2 days ago she then noticed some sharp right-sided and middle chest pain.  She states that that pain was very brief (~1 second) and then went away.  Now she has some soreness over her sternum that is minimal and she states not important at all.  No exertional component.There is no associated shortness of breath.  The pain in her shoulder was radiating down her arm today and there is some trapezius pain as well.  She has not tried anything for the pain because she does not like taking medicines and does not want anything to mix with her Adempas. Pain is 8/10 in shoulder. Chronic leg swelling that's unchanged.  Past Medical History:  Diagnosis Date  . Anxiety   . Cardiac arrhythmia   . Glaucoma   . Hypertension   . Scleroderma [facility])     Patient Active Problem List   Diagnosis Date Noted  . 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 Pulmonary hypertension ([place]) 07/23/2015  . Dyspnea on exertion 05/28/2015    Past Surgical History:  Procedure Laterality Date  . CARDIAC CATHETERIZATION N/A 07/24/2015   Procedure: Right Heart Cath;  Surgeon: Adrian Prows, MD;  Location: [facility];  Service: Cardiovascular;  Laterality: N/A;  .  VESICOVAGINAL FISTULA CLOSURE W/ TAH       OB History   None      Home Medications    Prior to Admission medications   Medication Sig Start Date End Date Taking? Authorizing Provider  aspirin EC 81 MG tablet Take 81 mg by mouth daily.    [provider]  famotidine (PEPCID) 20 MG tablet One at bedtime 10/09/16   Tanda Rockers, MD  omeprazole (PRILOSEC) 40 MG capsule Take 1 capsule (40 mg total) by mouth daily. TAKE 30 MINUTES BEFORE FIRST MEAL OF THE DAY 01/27/17   Mauri Pole, MD  Riociguat (ADEMPAS) 1.5 MG TABS Take 1 tablet by mouth 3 (three) times daily.    [provider]    Family History Family History  Problem Relation Age of Onset  . Breast cancer Mother   . Clotting disorder Mother   . Diabetes Mother   . Breast cancer Sister   . Clotting disorder Sister   . Prostate cancer Son   . Breast cancer Daughter   . Other Father        drowned  . Breast cancer Sister   . Breast cancer Other   . Colon cancer Neg Hx     Social History Social History   Tobacco Use  . Smoking status: Former Smoker    Types: Cigarettes  Last attempt to quit: 1971    Years since quitting: 48.8  . Smokeless tobacco: Never Used  Substance Use Topics  . Alcohol use: Yes    Alcohol/week: 0.0 standard drinks    Comment: occasional  . Drug use: No     Allergies   Patient has no known allergies.   Review of Systems Review of Systems  Constitutional: Negative for fever.  Respiratory: Positive for cough (chronic, in AM). Negative for shortness of breath.   Cardiovascular: Positive for chest pain.  Gastrointestinal: Negative for vomiting.  Musculoskeletal: Positive for arthralgias and myalgias. Negative for joint swelling.  Skin: Negative for color change.  Neurological: Negative for weakness and numbness.  All other systems reviewed and are negative.    Physical Exam Updated Vital Signs BP (!) 157/71 (BP Location: Right Arm)   Pulse (!) 52    Temp 98.2 F (36.8 C) (Oral)   Resp 16   Ht 5\' 4"  (1.626 m)   Wt 66.7 kg   SpO2 99%   BMI 25.23 kg/m   Physical Exam  Constitutional: She appears well-developed and well-nourished.  Non-toxic appearance. She does not appear ill.  HENT:  Head: Normocephalic and atraumatic.  Right Ear: External ear normal.  Left Ear: External ear normal.  Nose: Nose normal.  Eyes: Right eye exhibits no discharge. Left eye exhibits no discharge.  Cardiovascular: Normal rate, regular rhythm and normal heart sounds.  Pulses:      Radial pulses are 2+ on the right side.  Pulmonary/Chest: Effort normal and breath sounds normal. She exhibits tenderness (mild, over sternum).  Abdominal: Soft. There is no tenderness.  Musculoskeletal:       Right shoulder: She exhibits tenderness (mild). She exhibits normal range of motion (she is able to range shoulder but it is painful), no swelling and no effusion.       Right elbow: She exhibits normal range of motion. No tenderness found.       Right upper arm: She exhibits no tenderness.  Normal strength/sensation in R hand. Normal median, ulnar, radial nerve testing.  Neurological: She is alert.  Skin: Skin is warm and dry. No erythema.  Psychiatric: Her mood appears not anxious.  Nursing note and vitals reviewed.    ED Treatments / Results  Labs (all labs ordered are listed, but only abnormal results are displayed) Labs Reviewed  BASIC METABOLIC PANEL - Abnormal; Notable for the following components:      Result Value   Potassium 3.4 (*)    All other components within normal limits  CBC  I-STAT TROPONIN, ED    EKG EKG Interpretation  Date/Time:  Wednesday February 10 2018 10:35:49 EST Ventricular Rate:  59 PR Interval:  158 QRS Duration: 94 QT Interval:  456 QTC Calculation: 451 R Axis:   -68 Text Interpretation:  Sinus bradycardia with Premature atrial complexes Left anterior fascicular block Septal infarct , age undetermined Abnormal ECG  Interpretation limited secondary to artifact otherwise unchanged compared to 2017 Confirmed by Sherwood Gambler [phone]) on 02/10/2018 10:42:08 AM   Radiology Dg Chest 2 View  Result Date: 02/10/2018 CLINICAL DATA:  Hervey Ard midsternal pain this AM. Right shoulder pain x3 weeks that pt states began after receiving a flu shot. No hx of right shoulder injury or surgery. Hx of HTN, cardiac arrhythmia. Nonsmoker. EXAM: CHEST - 2 VIEW COMPARISON:  12/23/2017 FINDINGS: Moderate enlargement of the cardiopericardial silhouette, stable. No mediastinal or hilar masses. There is no evidence of adenopathy. Clear lungs.  No  pleural effusion or pneumothorax. There is irregular sclerosis along the proximal right humerus, stable. No fracture or acute skeletal abnormality. IMPRESSION: 1. No acute cardiopulmonary disease. 2. Stable cardiomegaly. Electronically Signed   By: Lajean Manes M.D.   On: 02/10/2018 12:16   Dg Shoulder Right  Result Date: 02/10/2018 CLINICAL DATA:  Sharp midsternal pain this AM. Right shoulder pain x3 weeks that pt states began after receiving a flu shot. No hx of right shoulder injury or surgery. Hx of HTN, cardiac arrhythmia. Nonsmoker. EXAM: RIGHT SHOULDER - 2+ VIEW COMPARISON:  Right shoulder MRI, 05/28/2015. FINDINGS: No fracture. Irregular sclerosis in the proximal right humerus is stable when compared to the prior MRI supporting a nonaggressive/benign bone lesion. No other bone lesions. Glenohumeral joint is normally spaced and aligned. Mild to moderate AC joint osteoarthritis, stable. Soft tissues are unremarkable. IMPRESSION: 1. No fracture or acute finding. 2. Stable benign appearing area of sclerosis in the proximal right humerus consistent with a benign bone lesion. 3. Mild to moderate AC joint osteoarthritis. Electronically Signed   By: Lajean Manes M.D.   On: 02/10/2018 12:18    Procedures Procedures (including critical care time)  Medications Ordered in ED Medications    acetaminophen (TYLENOL) tablet 650 mg (650 mg Oral Given 02/10/18 1101)  potassium chloride SA (K-DUR,KLOR-CON) CR tablet 40 mEq (40 mEq Oral Given 02/10/18 1230)     Initial Impression / Assessment and Plan / ED Course  I have reviewed the triage vital signs and the nursing notes.  Pertinent labs & imaging results that were available during my care of the patient were reviewed by me and considered in my medical decision making (see chart for details).     Chest pain is very atypical and given how long is been going on for 2+ days, with constant symptoms, benign ECG and troponin, I think she is unlikely to have ACS.  I highly doubt PE.  Right shoulder pain is probably muscular given it started after the flu injection.  She could have developed a hematoma given she had some bleeding issues after this.  However there is no obvious joint effusion or swelling now.  I am worried that because of pain she is not moving it as much and will develop a frozen shoulder.  We discussed this as well as shoulder exercises.  She has not taken anything and have encouraged her to at least try Tylenol and ice.  She has an orthopedist and I advised her to follow closely with them.  Return precautions.  Final Clinical Impressions(s) / ED Diagnoses   Final diagnoses:  Acute pain of right shoulder  Chest wall pain    ED Discharge Orders    None       Sherwood Gambler, MD 02/10/18 1530

## 2018-02-10 NOTE — ED Notes (Addendum)
Pt endorses right shoulder pain since having flu shot weeks ago. Worse with movement. Denies shob, dizziness, n/v. Began having right central CP this morning while sitting.

## 2018-02-10 NOTE — ED Notes (Signed)
Pt left for Xray.

## 2018-02-10 NOTE — Discharge Instructions (Signed)
If you develop fever, chest pain, vomiting, swelling to your joint, or any other new/concerning symptoms and return to the ER for evaluation.  Follow-up closely with your orthopedist.

## 2018-02-11 ENCOUNTER — Telehealth (HOSPITAL_COMMUNITY): Payer: Self-pay

## 2018-02-11 NOTE — Telephone Encounter (Signed)
Called and spoke with patient in regards to PR - scheduled orientation on 03/08/2018 at 1:30pm. Pt will attend the 10:30am exc class. Mailed packet.

## 2018-02-11 NOTE — Telephone Encounter (Signed)
Outside/paper referral received by [facility] from [facility]. Office notes were faxed over with Order. Insurance benefits and eligibility to be determined. Will contact patient for scheduling.

## 2018-02-24 ENCOUNTER — Other Ambulatory Visit: Payer: Self-pay | Admitting: Gastroenterology

## 2018-03-02 ENCOUNTER — Telehealth (HOSPITAL_COMMUNITY): Payer: Self-pay

## 2018-03-08 ENCOUNTER — Encounter (HOSPITAL_COMMUNITY): Payer: Self-pay

## 2018-03-08 ENCOUNTER — Encounter (HOSPITAL_COMMUNITY)
Admission: RE | Admit: 2018-03-08 | Discharge: 2018-03-08 | Disposition: A | Discharge status: Home / Self Care | Payer: Medicare Other | Source: Ambulatory Visit | Attending: Cardiology | Admitting: Cardiology

## 2018-03-08 VITALS — BP 162/71 | HR 86 | Temp 98.2°F | Resp 18 | Ht 64.0 in | Wt 150.6 lb

## 2018-03-08 DIAGNOSIS — I272 Pulmonary hypertension, unspecified: Secondary | ICD-10-CM

## 2018-03-08 DIAGNOSIS — I270 Primary pulmonary hypertension: Secondary | ICD-10-CM | POA: Diagnosis present

## 2018-03-08 NOTE — Progress Notes (Signed)
Nancy Lambert 77 y.o. female Pulmonary Rehab Orientation Note Nancy Lambert referred to pulmonary rehab by Dr. Einar Gip for Pulmonary Rehab for Pulmonary Hypertension. Nancy Lambert arrived today in Cardiac and Pulmonary Rehab for orientation to Pulmonary Rehab. She was transported from [employer] via wheel chair. She does not carry portable oxygen. Per pt, she uses oxygen never. Color good, skin warm and dry. Patient is oriented to time and place. Patient's medical history, psychosocial health, and medications reviewed. Psychosocial assessment reveals pt lives with their daughter. Pt is currently retired. Pt hobbies include watching TV and reading. Pt reports her stress level is non existent.   Pt does not exhibit  signs of depression normally however pt admits that at times she has moments where she feels "low"  Pt daughter passed away un expectantly in May 12, 2022.  This has been very difficult and with the holidays approaching there are reminders of her loss. Pt became very tearful in talking about her daughter. Signs of depression include sadness and difficulty maintaining sleep not from depression but due to GERD. PHQ2/9 score 1/3. Pt shows good  coping skills with positive outlook . Pt has a good relationship with her daughter and they live together.  Pt prefers to do her own thing and not have to be structured all the time.  This is often a source of contention with her siblings.. Will continue to monitor and evaluate progress toward psychosocial goal(s) of continued well being and the ability to take fore fo silf.. Physical assessment reveals heart rate is normal, breath sounds clear to auscultation, no wheezes, rales, or rhonchi. Grip strength equal, strong. Distal pulses palpable with no swelling.  Pt reports that she has swelling whenever she is sitting. Patient reports she does take medications as prescribed. Patient states she follows a Regular diet. The patient reports no specific efforts to gain or lose weight..  Patient's weight will be monitored closely. Demonstration and practice of PLB using pulse oximeter. Patient able to return demonstration satisfactorily. Safety and hand hygiene in the exercise area reviewed with patient. Patient voices understanding of the information reviewed. Department expectations discussed with patient and achievable goals were set. The patient shows enthusiasm about attending the program and we look forward to working with this nice lady. The patient is scheduled for a 6 min walk test on 12/12 and to begin exercise on 12/19 at 10:30. 45 minutes was spent on a variety of activities such as assessment of the patient, obtaining baseline data including height, weight, BMI, and grip strength, verifying medical history, allergies, and current medications, and teaching patient strategies for performing tasks with less respiratory effort with emphasis on pursed lip breathing. [id number] Nancy Lambert, BSN Cardiac and Training and development officer

## 2018-03-11 ENCOUNTER — Encounter (HOSPITAL_COMMUNITY)
Admission: RE | Admit: 2018-03-11 | Discharge: 2018-03-11 | Disposition: A | Discharge status: Home / Self Care | Payer: Medicare Other | Source: Ambulatory Visit | Attending: Cardiology | Admitting: Cardiology

## 2018-03-11 DIAGNOSIS — I270 Primary pulmonary hypertension: Secondary | ICD-10-CM | POA: Diagnosis not present

## 2018-03-11 DIAGNOSIS — I272 Pulmonary hypertension, unspecified: Secondary | ICD-10-CM

## 2018-03-11 NOTE — Progress Notes (Signed)
Pulmonary Individual Treatment Plan  Patient Details  Name: KORYN CHARLOT MRN: [id number] Date of Birth: 02/26/1941 Referring Provider:     Pulmonary Rehab Walk Test from 03/11/2018 in [place]  Referring Provider  Dr. Einar Gip      Initial Encounter Date:    Pulmonary Rehab Walk Test from 03/11/2018 in [place]  Date  03/11/18      Visit Diagnosis: No diagnosis found.  Patient's Home Medications on Admission:   Current Outpatient Medications:  .  aspirin EC 81 MG tablet, Take 81 mg by mouth daily., Disp: , Rfl:  .  famotidine (PEPCID) 20 MG tablet, One at bedtime, Disp: 30 tablet, Rfl: 11 .  omeprazole (PRILOSEC) 40 MG capsule, TAKE 1 CAPSULE BY MOUTH ONCE DAILY. TAKE 30 MINUTES BEFORE FIRST MEAL OF THE DAY, Disp: 90 capsule, Rfl: 3 .  Riociguat (ADEMPAS) 1.5 MG TABS, Take 1 tablet by mouth 3 (three) times daily., Disp: , Rfl:   Past Medical History: Past Medical History:  Diagnosis Date  . Anxiety   . Cardiac arrhythmia   . Glaucoma   . Hypertension   . Scleroderma ([place])     Tobacco Use: Social History   Tobacco Use  Smoking Status Former Smoker  . Types: Cigarettes  . Last attempt to quit: 1971  . Years since quitting: 48.9  Smokeless Tobacco Never Used    Labs: Recent Chemical engineer    Labs for ITP Cardiac and Pulmonary Rehab Latest Ref Rng & Units 07/24/2015   HCO3 20.0 - 24.0 mEq/L 28.5(H)   TCO2 0 - 100 mmol/L 30   O2SAT % 66.0      Capillary Blood Glucose: No results found for: GLUCAP   Pulmonary Assessment Scores: Pulmonary Assessment Scores    Row Name 03/11/18 1613 03/11/18 1614       ADL UCSD   ADL Phase  Entry  -    SOB Score total  21  -      CAT Score   CAT Score  -  6       Pulmonary Function Assessment:   Exercise Target Goals: Exercise Program Goal: Individual exercise prescription set using results from initial 6 min walk test and THRR while  considering  patient's activity barriers and safety.   Exercise Prescription Goal: Initial exercise prescription builds to 30-45 minutes a day of aerobic activity, 2-3 days per week.  Home exercise guidelines will be given to patient during program as part of exercise prescription that the participant will acknowledge.  Activity Barriers & Risk Stratification: Activity Barriers & Cardiac Risk Stratification - 03/08/18 1440      Activity Barriers & Cardiac Risk Stratification   Activity Barriers  Other (comment);Balance Concerns;History of Falls   varicose vein   Cardiac Risk Stratification  High       6 Minute Walk: 6 Minute Walk    Row Name 03/11/18 1619         6 Minute Walk   Phase  Initial     Distance  1267 feet     Walk Time  6 minutes     # of Rest Breaks  0     MPH  2.4     METS  2.84     RPE  11     Perceived Dyspnea   1     Symptoms  No     Resting HR  56 bpm  Resting BP  122/64     Resting Oxygen Saturation   96 %     Exercise Oxygen Saturation  during 6 min walk  95 %     Max Ex. HR  94 bpm     Max Ex. BP  142/68     2 Minute Post BP  126/62       Interval HR   1 Minute HR  80     2 Minute HR  88     3 Minute HR  94     4 Minute HR  93     5 Minute HR  90     6 Minute HR  93     2 Minute Post HR  65     Interval Heart Rate?  Yes       Interval Oxygen   Interval Oxygen?  Yes     Baseline Oxygen Saturation %  96 %     1 Minute Oxygen Saturation %  96 %     1 Minute Liters of Oxygen  0 L     2 Minute Oxygen Saturation %  95 %     2 Minute Liters of Oxygen  0 L     3 Minute Oxygen Saturation %  95 %     3 Minute Liters of Oxygen  0 L     4 Minute Oxygen Saturation %  95 %     4 Minute Liters of Oxygen  0 L     5 Minute Oxygen Saturation %  96 %     5 Minute Liters of Oxygen  0 L     6 Minute Oxygen Saturation %  97 %     6 Minute Liters of Oxygen  0 L     2 Minute Post Oxygen Saturation %  96 %     2 Minute Post Liters of Oxygen  0 L         Oxygen Initial Assessment: Oxygen Initial Assessment - 03/08/18 1442      Home Oxygen   Home Oxygen Device  None    Sleep Oxygen Prescription  None    Home Exercise Oxygen Prescription  None    Home at Rest Exercise Oxygen Prescription  None      Initial 6 min Walk   Oxygen Used  None      Program Oxygen Prescription   Program Oxygen Prescription  None      Intervention   Short Term Goals  To learn and exhibit compliance with exercise, home and travel O2 prescription;To learn and understand importance of maintaining oxygen saturations>88%;To learn and demonstrate proper use of respiratory medications;To learn and understand importance of monitoring SPO2 with pulse oximeter and demonstrate accurate use of the pulse oximeter.;To learn and demonstrate proper pursed lip breathing techniques or other breathing techniques.    Long  Term Goals  Verbalizes importance of monitoring SPO2 with pulse oximeter and return demonstration;Exhibits proper breathing techniques, such as pursed lip breathing or other method taught during program session;Exhibits compliance with exercise, home and travel O2 prescription;Maintenance of O2 saturations>88%       Oxygen Re-Evaluation:   Oxygen Discharge (Final Oxygen Re-Evaluation):   Initial Exercise Prescription: Initial Exercise Prescription - 03/11/18 1600      Date of Initial Exercise RX and Referring Provider   Date  03/11/18    Referring Provider  Dr. Einar Gip      Bike   Level  0.5  Minutes  17      NuStep   Level  3    SPM  90    Minutes  17      Track   Laps  13    Minutes  17      Prescription Details   Frequency (times per week)  2    Duration  Progress to 45 minutes of aerobic exercise without signs/symptoms of physical distress      Intensity   THRR 40-80% of Max Heartrate  58-116    Ratings of Perceived Exertion  11-13    Perceived Dyspnea  0-4      Progression   Progression  Continue to progress workloads to  maintain intensity without signs/symptoms of physical distress.      Resistance Training   Training Prescription  Yes    Weight  orange bands    Reps  10-15       Perform Capillary Blood Glucose checks as needed.  Exercise Prescription Changes:   Exercise Comments:   Exercise Goals and Review: Exercise Goals    Row Name 03/08/18 1434             Exercise Goals   Increase Physical Activity  Yes       Intervention  Provide advice, education, support and counseling about physical activity/exercise needs.;Develop an individualized exercise prescription for aerobic and resistive training based on initial evaluation findings, risk stratification, comorbidities and participant's personal goals.       Expected Outcomes  Short Term: Attend rehab on a regular basis to increase amount of physical activity.;Long Term: Exercising regularly at least 3-5 days a week.;Long Term: Add in home exercise to make exercise part of routine and to increase amount of physical activity.       Increase Strength and Stamina  Yes       Intervention  Provide advice, education, support and counseling about physical activity/exercise needs.;Develop an individualized exercise prescription for aerobic and resistive training based on initial evaluation findings, risk stratification, comorbidities and participant's personal goals.       Expected Outcomes  Short Term: Increase workloads from initial exercise prescription for resistance, speed, and METs.;Short Term: Perform resistance training exercises routinely during rehab and add in resistance training at home;Long Term: Improve cardiorespiratory fitness, muscular endurance and strength as measured by increased METs and functional capacity (6MWT)       Able to understand and use rate of perceived exertion (RPE) scale  Yes       Intervention  Provide education and explanation on how to use RPE scale       Expected Outcomes  Short Term: Able to use RPE daily in rehab  to express subjective intensity level;Long Term:  Able to use RPE to guide intensity level when exercising independently       Able to understand and use Dyspnea scale  Yes       Intervention  Provide education and explanation on how to use Dyspnea scale       Expected Outcomes  Short Term: Able to use Dyspnea scale daily in rehab to express subjective sense of shortness of breath during exertion       Knowledge and understanding of Target Heart Rate Range (THRR)  Yes       Intervention  Provide education and explanation of THRR including how the numbers were predicted and where they are located for reference       Expected Outcomes  Short Term: Able to state/look  up THRR;Short Term: Able to use daily as guideline for intensity in rehab;Long Term: Able to use THRR to govern intensity when exercising independently       Understanding of Exercise Prescription  Yes       Intervention  Provide education, explanation, and written materials on patient's individual exercise prescription       Expected Outcomes  Short Term: Able to explain program exercise prescription;Long Term: Able to explain home exercise prescription to exercise independently          Exercise Goals Re-Evaluation :   Discharge Exercise Prescription (Final Exercise Prescription Changes):   Nutrition:  Target Goals: Understanding of nutrition guidelines, daily intake of sodium 1500mg , cholesterol 200mg , calories 30% from fat and 7% or less from saturated fats, daily to have 5 or more servings of fruits and vegetables.  Biometrics:    Nutrition Therapy Plan and Nutrition Goals:   Nutrition Assessments:   Nutrition Goals Re-Evaluation:   Nutrition Goals Discharge (Final Nutrition Goals Re-Evaluation):   Psychosocial: Target Goals: Acknowledge presence or absence of significant depression and/or stress, maximize coping skills, provide positive support system. Participant is able to verbalize types and ability to use  techniques and skills needed for reducing stress and depression.  Initial Review & Psychosocial Screening: Initial Psych Review & Screening - 03/08/18 1541      Family Dynamics   Comments  pt lives with daughter.   Loner by choice.  Sisters live in the area has relationship but not close       Quality of Life Scores:  Scores of 19 and below usually indicate a poorer quality of life in these areas.  A difference of  2-3 points is a clinically meaningful difference.  A difference of 2-3 points in the total score of the Quality of Life Index has been associated with significant improvement in overall quality of life, self-image, physical symptoms, and general health in studies assessing change in quality of life.   PHQ-9: Recent Review Flowsheet Data    Depression screen [facility] 2/9 03/08/2018   Decreased Interest 1   Down, Depressed, Hopeless 0   PHQ - 2 Score 1   Altered sleeping 1    Tired, decreased energy 1   Change in appetite 0   Feeling bad or failure about yourself  0   Trouble concentrating 0   Moving slowly or fidgety/restless 0   Suicidal thoughts 0   PHQ-9 Score 3   Difficult doing work/chores Not difficult at all     Interpretation of Total Score  Total Score Depression Severity:  1-4 = Minimal depression, 5-9 = Mild depression, 10-14 = Moderate depression, 15-19 = Moderately severe depression, 20-27 = Severe depression   Psychosocial Evaluation and Intervention:   Psychosocial Re-Evaluation:   Psychosocial Discharge (Final Psychosocial Re-Evaluation):    Education: Education Goals: Education classes will be provided on a weekly basis, covering required topics. Participant will state understanding/return demonstration of topics presented.  Learning Barriers/Preferences: Learning Barriers/Preferences - 03/08/18 1447      Learning Barriers/Preferences   Learning Barriers  Sight    Learning Preferences  Group Instruction;Individual Instruction;Written Material        Education Topics: How Lungs Work and Diseases: - Discuss the anatomy of the lungs and diseases that can affect the lungs, such as COPD.   Exercise: -Discuss the importance of exercise, FITT principles of exercise, normal and abnormal responses to exercise, and how to exercise safely.   Environmental Irritants: -Discuss types of environmental  irritants and how to limit exposure to environmental irritants.   Meds/Inhalers and oxygen: - Discuss respiratory medications, definition of an inhaler and oxygen, and the proper way to use an inhaler and oxygen.   Energy Saving Techniques: - Discuss methods to conserve energy and decrease shortness of breath when performing activities of daily living.    Bronchial Hygiene / Breathing Techniques: - Discuss breathing mechanics, pursed-lip breathing technique,  proper posture, effective ways to clear airways, and other functional breathing techniques   Cleaning Equipment: - Provides group verbal and written instruction about the health risks of elevated stress, cause of high stress, and healthy ways to reduce stress.   Nutrition I: Fats: - Discuss the types of cholesterol, what cholesterol does to the body, and how cholesterol levels can be controlled.   Nutrition II: Labels: -Discuss the different components of food labels and how to read food labels.   Respiratory Infections: - Discuss the signs and symptoms of respiratory infections, ways to prevent respiratory infections, and the importance of seeking medical treatment when having a respiratory infection.   Stress I: Signs and Symptoms: - Discuss the causes of stress, how stress may lead to anxiety and depression, and ways to limit stress.   Stress II: Relaxation: -Discuss relaxation techniques to limit stress.   Oxygen for Home/Travel: - Discuss how to prepare for travel when on oxygen and proper ways to transport and store oxygen to ensure safety.   Knowledge  Questionnaire Score: Knowledge Questionnaire Score - 03/11/18 1613      Knowledge Questionnaire Score   Pre Score  15/18       Core Components/Risk Factors/Patient Goals at Admission: Personal Goals and Risk Factors at Admission - 03/08/18 1449      Core Components/Risk Factors/Patient Goals on Admission   Improve shortness of breath with ADL's  Yes    Intervention  Provide education, individualized exercise plan and daily activity instruction to help decrease symptoms of SOB with activities of daily living.    Expected Outcomes  Short Term: Improve cardiorespiratory fitness to achieve a reduction of symptoms when performing ADLs;Long Term: Be able to perform more ADLs without symptoms or delay the onset of symptoms    Lipids  Yes    Intervention  Provide education and support for participant on nutrition & aerobic/resistive exercise along with prescribed medications to achieve LDL 70mg , HDL >40mg .    Expected Outcomes  Short Term: Participant states understanding of desired cholesterol values and is compliant with medications prescribed. Participant is following exercise prescription and nutrition guidelines.;Long Term: Cholesterol controlled with medications as prescribed, with individualized exercise RX and with personalized nutrition plan. Value goals: LDL < 70mg , HDL > 40 mg.       Core Components/Risk Factors/Patient Goals Review:    Core Components/Risk Factors/Patient Goals at Discharge (Final Review):    ITP Comments: ITP Comments    Row Name 03/08/18 1415           ITP Comments  Dr. Manfred Arch, Medical Director Pulmonary Rehab          Comments:

## 2018-03-12 NOTE — Progress Notes (Signed)
Pulmonary Individual Treatment Plan  Patient Details  Name: Nancy Lambert MRN: [id number] Date of Birth: 1940-07-14 Referring Provider:     Pulmonary Rehab Walk Test from 03/11/2018 in [place]  Referring Provider  Dr. Einar Gip      Initial Encounter Date:    Pulmonary Rehab Walk Test from 03/11/2018 in [place]  Date  03/11/18      Visit Diagnosis: Pulmonary hypertension ([place])  Patient's Home Medications on Admission:   Current Outpatient Medications:  .  aspirin EC 81 MG tablet, Take 81 mg by mouth daily., Disp: , Rfl:  .  famotidine (PEPCID) 20 MG tablet, One at bedtime, Disp: 30 tablet, Rfl: 11 .  omeprazole (PRILOSEC) 40 MG capsule, TAKE 1 CAPSULE BY MOUTH ONCE DAILY. TAKE 30 MINUTES BEFORE FIRST MEAL OF THE DAY, Disp: 90 capsule, Rfl: 3 .  Riociguat (ADEMPAS) 1.5 MG TABS, Take 1 tablet by mouth 3 (three) times daily., Disp: , Rfl:   Past Medical History: Past Medical History:  Diagnosis Date  . Anxiety   . Cardiac arrhythmia   . Glaucoma   . Hypertension   . Scleroderma ([place])     Tobacco Use: Social History   Tobacco Use  Smoking Status Former Smoker  . Types: Cigarettes  . Last attempt to quit: 1971  . Years since quitting: 48.9  Smokeless Tobacco Never Used    Labs: Recent Chemical engineer    Labs for ITP Cardiac and Pulmonary Rehab Latest Ref Rng & Units 07/24/2015   HCO3 20.0 - 24.0 mEq/L 28.5(H)   TCO2 0 - 100 mmol/L 30   O2SAT % 66.0      Capillary Blood Glucose: No results found for: GLUCAP   Pulmonary Assessment Scores: Pulmonary Assessment Scores    Row Name 03/11/18 1613 03/11/18 1614       ADL UCSD   ADL Phase  Entry  -    SOB Score total  21  -      CAT Score   CAT Score  -  6       Pulmonary Function Assessment:   Exercise Target Goals: Exercise Program Goal: Individual exercise prescription set using results from initial 6 min walk test and THRR  while considering  patient's activity barriers and safety.   Exercise Prescription Goal: Initial exercise prescription builds to 30-45 minutes a day of aerobic activity, 2-3 days per week.  Home exercise guidelines will be given to patient during program as part of exercise prescription that the participant will acknowledge.  Activity Barriers & Risk Stratification: Activity Barriers & Cardiac Risk Stratification - 03/08/18 1440      Activity Barriers & Cardiac Risk Stratification   Activity Barriers  Other (comment);Balance Concerns;History of Falls   varicose vein   Cardiac Risk Stratification  High       6 Minute Walk: 6 Minute Walk    Row Name 03/11/18 1619         6 Minute Walk   Phase  Initial     Distance  1267 feet     Walk Time  6 minutes     # of Rest Breaks  0     MPH  2.4     METS  2.84     RPE  11     Perceived Dyspnea   1     Symptoms  No     Resting HR  56 bpm  Resting BP  122/64     Resting Oxygen Saturation   96 %     Exercise Oxygen Saturation  during 6 min walk  95 %     Max Ex. HR  94 bpm     Max Ex. BP  142/68     2 Minute Post BP  126/62       Interval HR   1 Minute HR  80     2 Minute HR  88     3 Minute HR  94     4 Minute HR  93     5 Minute HR  90     6 Minute HR  93     2 Minute Post HR  65     Interval Heart Rate?  Yes       Interval Oxygen   Interval Oxygen?  Yes     Baseline Oxygen Saturation %  96 %     1 Minute Oxygen Saturation %  96 %     1 Minute Liters of Oxygen  0 L     2 Minute Oxygen Saturation %  95 %     2 Minute Liters of Oxygen  0 L     3 Minute Oxygen Saturation %  95 %     3 Minute Liters of Oxygen  0 L     4 Minute Oxygen Saturation %  95 %     4 Minute Liters of Oxygen  0 L     5 Minute Oxygen Saturation %  96 %     5 Minute Liters of Oxygen  0 L     6 Minute Oxygen Saturation %  97 %     6 Minute Liters of Oxygen  0 L     2 Minute Post Oxygen Saturation %  96 %     2 Minute Post Liters of Oxygen  0 L         Oxygen Initial Assessment: Oxygen Initial Assessment - 03/08/18 1442      Home Oxygen   Home Oxygen Device  None    Sleep Oxygen Prescription  None    Home Exercise Oxygen Prescription  None    Home at Rest Exercise Oxygen Prescription  None      Initial 6 min Walk   Oxygen Used  None      Program Oxygen Prescription   Program Oxygen Prescription  None      Intervention   Short Term Goals  To learn and exhibit compliance with exercise, home and travel O2 prescription;To learn and understand importance of maintaining oxygen saturations>88%;To learn and demonstrate proper use of respiratory medications;To learn and understand importance of monitoring SPO2 with pulse oximeter and demonstrate accurate use of the pulse oximeter.;To learn and demonstrate proper pursed lip breathing techniques or other breathing techniques.    Long  Term Goals  Verbalizes importance of monitoring SPO2 with pulse oximeter and return demonstration;Exhibits proper breathing techniques, such as pursed lip breathing or other method taught during program session;Exhibits compliance with exercise, home and travel O2 prescription;Maintenance of O2 saturations>88%       Oxygen Re-Evaluation:   Oxygen Discharge (Final Oxygen Re-Evaluation):   Initial Exercise Prescription: Initial Exercise Prescription - 03/11/18 1600      Date of Initial Exercise RX and Referring Provider   Date  03/11/18    Referring Provider  Dr. Einar Gip      Bike   Level  0.5  Minutes  17      NuStep   Level  3    SPM  90    Minutes  17      Track   Laps  13    Minutes  17      Prescription Details   Frequency (times per week)  2    Duration  Progress to 45 minutes of aerobic exercise without signs/symptoms of physical distress      Intensity   THRR 40-80% of Max Heartrate  58-116    Ratings of Perceived Exertion  11-13    Perceived Dyspnea  0-4      Progression   Progression  Continue to progress workloads to  maintain intensity without signs/symptoms of physical distress.      Resistance Training   Training Prescription  Yes    Weight  orange bands    Reps  10-15       Perform Capillary Blood Glucose checks as needed.  Exercise Prescription Changes:   Exercise Comments:   Exercise Goals and Review: Exercise Goals    Row Name 03/08/18 1434             Exercise Goals   Increase Physical Activity  Yes       Intervention  Provide advice, education, support and counseling about physical activity/exercise needs.;Develop an individualized exercise prescription for aerobic and resistive training based on initial evaluation findings, risk stratification, comorbidities and participant's personal goals.       Expected Outcomes  Short Term: Attend rehab on a regular basis to increase amount of physical activity.;Long Term: Exercising regularly at least 3-5 days a week.;Long Term: Add in home exercise to make exercise part of routine and to increase amount of physical activity.       Increase Strength and Stamina  Yes       Intervention  Provide advice, education, support and counseling about physical activity/exercise needs.;Develop an individualized exercise prescription for aerobic and resistive training based on initial evaluation findings, risk stratification, comorbidities and participant's personal goals.       Expected Outcomes  Short Term: Increase workloads from initial exercise prescription for resistance, speed, and METs.;Short Term: Perform resistance training exercises routinely during rehab and add in resistance training at home;Long Term: Improve cardiorespiratory fitness, muscular endurance and strength as measured by increased METs and functional capacity (6MWT)       Able to understand and use rate of perceived exertion (RPE) scale  Yes       Intervention  Provide education and explanation on how to use RPE scale       Expected Outcomes  Short Term: Able to use RPE daily in rehab  to express subjective intensity level;Long Term:  Able to use RPE to guide intensity level when exercising independently       Able to understand and use Dyspnea scale  Yes       Intervention  Provide education and explanation on how to use Dyspnea scale       Expected Outcomes  Short Term: Able to use Dyspnea scale daily in rehab to express subjective sense of shortness of breath during exertion       Knowledge and understanding of Target Heart Rate Range (THRR)  Yes       Intervention  Provide education and explanation of THRR including how the numbers were predicted and where they are located for reference       Expected Outcomes  Short Term: Able to state/look  up THRR;Short Term: Able to use daily as guideline for intensity in rehab;Long Term: Able to use THRR to govern intensity when exercising independently       Understanding of Exercise Prescription  Yes       Intervention  Provide education, explanation, and written materials on patient's individual exercise prescription       Expected Outcomes  Short Term: Able to explain program exercise prescription;Long Term: Able to explain home exercise prescription to exercise independently          Exercise Goals Re-Evaluation :   Discharge Exercise Prescription (Final Exercise Prescription Changes):   Nutrition:  Target Goals: Understanding of nutrition guidelines, daily intake of sodium 1500mg , cholesterol 200mg , calories 30% from fat and 7% or less from saturated fats, daily to have 5 or more servings of fruits and vegetables.  Biometrics:    Nutrition Therapy Plan and Nutrition Goals:   Nutrition Assessments:   Nutrition Goals Re-Evaluation:   Nutrition Goals Discharge (Final Nutrition Goals Re-Evaluation):   Psychosocial: Target Goals: Acknowledge presence or absence of significant depression and/or stress, maximize coping skills, provide positive support system. Participant is able to verbalize types and ability to use  techniques and skills needed for reducing stress and depression.  Initial Review & Psychosocial Screening: Initial Psych Review & Screening - 03/08/18 1541      Family Dynamics   Comments  pt lives with daughter.   Loner by choice.  Sisters live in the area has relationship but not close       Quality of Life Scores:  Scores of 19 and below usually indicate a poorer quality of life in these areas.  A difference of  2-3 points is a clinically meaningful difference.  A difference of 2-3 points in the total score of the Quality of Life Index has been associated with significant improvement in overall quality of life, self-image, physical symptoms, and general health in studies assessing change in quality of life.   PHQ-9: Recent Review Flowsheet Data    Depression screen [facility] 2/9 03/08/2018   Decreased Interest 1   Down, Depressed, Hopeless 0   PHQ - 2 Score 1   Altered sleeping 1    Tired, decreased energy 1   Change in appetite 0   Feeling bad or failure about yourself  0   Trouble concentrating 0   Moving slowly or fidgety/restless 0   Suicidal thoughts 0   PHQ-9 Score 3   Difficult doing work/chores Not difficult at all     Interpretation of Total Score  Total Score Depression Severity:  1-4 = Minimal depression, 5-9 = Mild depression, 10-14 = Moderate depression, 15-19 = Moderately severe depression, 20-27 = Severe depression   Psychosocial Evaluation and Intervention:   Psychosocial Re-Evaluation:   Psychosocial Discharge (Final Psychosocial Re-Evaluation):    Education: Education Goals: Education classes will be provided on a weekly basis, covering required topics. Participant will state understanding/return demonstration of topics presented.  Learning Barriers/Preferences: Learning Barriers/Preferences - 03/08/18 1447      Learning Barriers/Preferences   Learning Barriers  Sight    Learning Preferences  Group Instruction;Individual Instruction;Written Material        Education Topics: How Lungs Work and Diseases: - Discuss the anatomy of the lungs and diseases that can affect the lungs, such as COPD.   Exercise: -Discuss the importance of exercise, FITT principles of exercise, normal and abnormal responses to exercise, and how to exercise safely.   Environmental Irritants: -Discuss types of environmental  irritants and how to limit exposure to environmental irritants.   Meds/Inhalers and oxygen: - Discuss respiratory medications, definition of an inhaler and oxygen, and the proper way to use an inhaler and oxygen.   Energy Saving Techniques: - Discuss methods to conserve energy and decrease shortness of breath when performing activities of daily living.    Bronchial Hygiene / Breathing Techniques: - Discuss breathing mechanics, pursed-lip breathing technique,  proper posture, effective ways to clear airways, and other functional breathing techniques   Cleaning Equipment: - Provides group verbal and written instruction about the health risks of elevated stress, cause of high stress, and healthy ways to reduce stress.   Nutrition I: Fats: - Discuss the types of cholesterol, what cholesterol does to the body, and how cholesterol levels can be controlled.   Nutrition II: Labels: -Discuss the different components of food labels and how to read food labels.   Respiratory Infections: - Discuss the signs and symptoms of respiratory infections, ways to prevent respiratory infections, and the importance of seeking medical treatment when having a respiratory infection.   Stress I: Signs and Symptoms: - Discuss the causes of stress, how stress may lead to anxiety and depression, and ways to limit stress.   Stress II: Relaxation: -Discuss relaxation techniques to limit stress.   Oxygen for Home/Travel: - Discuss how to prepare for travel when on oxygen and proper ways to transport and store oxygen to ensure safety.   Knowledge  Questionnaire Score: Knowledge Questionnaire Score - 03/11/18 1613      Knowledge Questionnaire Score   Pre Score  15/18       Core Components/Risk Factors/Patient Goals at Admission: Personal Goals and Risk Factors at Admission - 03/08/18 1449      Core Components/Risk Factors/Patient Goals on Admission   Improve shortness of breath with ADL's  Yes    Intervention  Provide education, individualized exercise plan and daily activity instruction to help decrease symptoms of SOB with activities of daily living.    Expected Outcomes  Short Term: Improve cardiorespiratory fitness to achieve a reduction of symptoms when performing ADLs;Long Term: Be able to perform more ADLs without symptoms or delay the onset of symptoms    Lipids  Yes    Intervention  Provide education and support for participant on nutrition & aerobic/resistive exercise along with prescribed medications to achieve LDL 70mg , HDL >40mg .    Expected Outcomes  Short Term: Participant states understanding of desired cholesterol values and is compliant with medications prescribed. Participant is following exercise prescription and nutrition guidelines.;Long Term: Cholesterol controlled with medications as prescribed, with individualized exercise RX and with personalized nutrition plan. Value goals: LDL < 70mg , HDL > 40 mg.       Core Components/Risk Factors/Patient Goals Review:    Core Components/Risk Factors/Patient Goals at Discharge (Final Review):    ITP Comments: ITP Comments    Row Name 03/08/18 1415           ITP Comments  Dr. Manfred Arch, Medical Director Pulmonary Rehab          Comments:

## 2018-03-18 ENCOUNTER — Encounter (HOSPITAL_COMMUNITY)
Admission: RE | Admit: 2018-03-18 | Discharge: 2018-03-18 | Disposition: A | Discharge status: Home / Self Care | Payer: Medicare Other | Source: Ambulatory Visit | Attending: Cardiology | Admitting: Cardiology

## 2018-03-18 VITALS — Wt 141.5 lb

## 2018-03-18 DIAGNOSIS — I272 Pulmonary hypertension, unspecified: Secondary | ICD-10-CM

## 2018-03-18 DIAGNOSIS — I270 Primary pulmonary hypertension: Secondary | ICD-10-CM | POA: Diagnosis not present

## 2018-03-18 NOTE — Progress Notes (Signed)
Daily Session Note  Patient Details  Name: Nancy Lambert MRN: [id number] Date of Birth: 03/28/1941 Referring Provider:     Pulmonary Rehab Walk Test from 03/11/2018 in [facility]  Referring Provider  Dr. Ganji      Encounter Date: 03/18/2018  Check In: Session Check In - 03/18/18 1024      Check-In   Supervising physician immediately available to respond to emergencies  [facility] immediately available    Physician(s)  Dr. Akula    Location  [facility]    Staff Present  Lindsay Agnew RN, BSN;Dalton Fletcher, MS, Exercise Physiologist;Joan Behrens, RN, BSN;Carlette Carlton, RN, BSN;Lisa Hughes, RN    Medication changes reported      No    Fall or balance concerns reported     No    Tobacco Cessation  No Change    Warm-up and Cool-down  Performed as group-led instruction    Resistance Training Performed  Yes    VAD Patient?  No    PAD/SET Patient?  No      Pain Assessment   Currently in Pain?  No/denies    Multiple Pain Sites  No       Capillary Blood Glucose: No results found for this or any previous visit (from the past 24 hour(s)).    Social History   Tobacco Use  Smoking Status Former Smoker  . Types: Cigarettes  . Last attempt to quit: 1971  . Years since quitting: 48.9  Smokeless Tobacco Never Used    Goals Met:  Proper associated with RPD/PD & O2 Sat Exercise tolerated well Strength training completed today  Goals Unmet:  Not Applicable  Comments: Service time is from 1030 to 1200   Dr. Wesam G. Yacoub is Medical Director for Pulmonary Rehab at [facility]. 

## 2018-03-23 ENCOUNTER — Encounter (HOSPITAL_COMMUNITY): Payer: Medicare Other

## 2018-03-25 ENCOUNTER — Encounter (HOSPITAL_COMMUNITY): Payer: Medicare Other

## 2018-03-29 NOTE — Progress Notes (Signed)
Nancy Lambert 77 y.o. female  DOB: [birth year] MRN: [id number]           Nutrition Note 1. Pulmonary hypertension ([place])    Past Medical History:  Diagnosis Date  . Anxiety   . Cardiac arrhythmia   . Glaucoma   . Hypertension   . Scleroderma ([place])    Meds reviewed.    Current Outpatient Medications (Cardiovascular):  Marland Kitchen  Riociguat (ADEMPAS) 1.5 MG TABS, Take 1 tablet by mouth 3 (three) times daily.   Current Outpatient Medications (Analgesics):  .  aspirin EC 81 MG tablet, Take 81 mg by mouth daily.   Current Outpatient Medications (Other):  .  famotidine (PEPCID) 20 MG tablet, One at bedtime .  omeprazole (PRILOSEC) 40 MG capsule, TAKE 1 CAPSULE BY MOUTH ONCE DAILY. TAKE 30 MINUTES BEFORE FIRST MEAL OF THE DAY   Ht: Ht Readings from Last 1 Encounters:  03/08/18 5\' 4"  (1.626 m)     Wt:  Wt Readings from Last 3 Encounters:  03/23/18 141 lb 8.6 oz (64.2 kg)  03/08/18 150 lb 9.2 oz (68.3 kg)  02/10/18 147 lb (66.7 kg)     BMI: Body mass index is 24.29 kg/m.    Current tobacco use? No    Labs:  Lipid Panel  No results found for: CHOL, TRIG, HDL, CHOLHDL, VLDL, LDLCALC, LDLDIRECT  No results found for: HGBA1C  Nutrition Diagnosis ? Food-and nutrition-related knowledge deficit related to lack of exposure to information as related to diagnosis of pulmonary disease  Goal(s) 1. Pt to identify and limit food sources of sodium. 2. The pt will recognize symptoms that can interfere with adequate oral intake, such as shortness of breath, N/V, early satiety, fatigue, ability to secure and prepare food, taste and smell changes, chewing/swallowing difficulties, and/ or pain when eating. 3. The pt will consume high-energy, high-nutrient dense beverages when necessary to compensate for decreased oral intake of solid foods. 4. The pt will have family and friends shop for food when necessary so that nourishing foods are always available at home. 5. Describe the benefit of  including fruits, vegetables, whole grains, and low-fat dairy products in a healthy meal plan.  Plan:  Pt to attend Pulmonary Nutrition class Will provide client-centered nutrition education as part of interdisciplinary care.    Monitor and Evaluate progress toward nutrition goal with team.   Laurina Bustle, MS, RD, LDN 03/29/2018 3:34 PM

## 2018-03-30 ENCOUNTER — Encounter (HOSPITAL_COMMUNITY): Payer: Medicare Other

## 2018-03-30 ENCOUNTER — Telehealth (HOSPITAL_COMMUNITY): Payer: Self-pay | Admitting: Internal Medicine

## 2018-03-31 HISTORY — PX: OTHER SURGICAL HISTORY: SHX169

## 2018-04-01 ENCOUNTER — Encounter (HOSPITAL_COMMUNITY): Payer: Medicare Other

## 2018-04-06 ENCOUNTER — Encounter (HOSPITAL_COMMUNITY)
Admission: RE | Admit: 2018-04-06 | Discharge: 2018-04-06 | Disposition: A | Discharge status: Home / Self Care | Payer: Medicare Other | Source: Ambulatory Visit | Attending: Cardiology | Admitting: Cardiology

## 2018-04-06 VITALS — Ht 64.0 in | Wt 142.4 lb

## 2018-04-06 DIAGNOSIS — I270 Primary pulmonary hypertension: Secondary | ICD-10-CM | POA: Diagnosis not present

## 2018-04-06 DIAGNOSIS — I272 Pulmonary hypertension, unspecified: Secondary | ICD-10-CM

## 2018-04-06 NOTE — Progress Notes (Signed)
Daily Session Note  Patient Details  Name: Nancy Lambert MRN: [id number] Date of Birth: [birth year] Referring Provider:     Pulmonary Rehab Walk Test from 03/11/2018 in [place]  Referring Provider  Dr. Einar Gip      Encounter Date: 04/06/2018  Check In: Session Check In - 04/06/18 1233      Check-In   Supervising physician immediately available to respond to emergencies  [facility] immediately available    Physician(s)  Dr. Tana Coast    Location  [facility]    Staff Present  Rosebud Poles, RN, BSN;Carlette Wilber Oliphant, RN, Bjorn Loser, MS, Exercise Physiologist;Javarri Segal Ysidro Evert, Felipe Drone, RN, MHA    Medication changes reported      No    Fall or balance concerns reported     No    Tobacco Cessation  No Change    Warm-up and Cool-down  Performed as group-led instruction    Resistance Training Performed  Yes    VAD Patient?  No    PAD/SET Patient?  No      Pain Assessment   Currently in Pain?  No/denies    Pain Score  0-No pain    Multiple Pain Sites  No       Capillary Blood Glucose: No results found for this or any previous visit (from the past 24 hour(s)).  Exercise Prescription Changes - 04/06/18 1500      Response to Exercise   Blood Pressure (Admit)  124/68    Blood Pressure (Exercise)  154/62    Blood Pressure (Exit)  120/70    Heart Rate (Admit)  71 bpm    Heart Rate (Exercise)  97 bpm    Heart Rate (Exit)  78 bpm    Oxygen Saturation (Admit)  95 %    Oxygen Saturation (Exercise)  95 %    Oxygen Saturation (Exit)  94 %    Rating of Perceived Exertion (Exercise)  13    Perceived Dyspnea (Exercise)  1    Duration  Progress to 45 minutes of aerobic exercise without signs/symptoms of physical distress    Intensity  THRR unchanged      Progression   Progression  Continue to progress workloads to maintain intensity without signs/symptoms of physical distress.      Resistance Training   Training  Prescription  Yes    Weight  orange bands    Reps  10-15    Time  10 Minutes      NuStep   Level  3    SPM  80    Minutes  17    METs  2      Track   Laps  13    Minutes  17       Social History   Tobacco Use  Smoking Status Former Smoker  . Types: Cigarettes  . Last attempt to quit: 1971  . Years since quitting: 49.0  Smokeless Tobacco Never Used    Goals Met:  Exercise tolerated well No report of cardiac concerns or symptoms Strength training completed today  Goals Unmet:  Not Applicable  Comments: Service time is from 1030 to 1210    Dr. Rush Farmer is Medical Director for Pulmonary Rehab at [facility].

## 2018-04-06 NOTE — Progress Notes (Signed)
Nancy Lambert 78 y.o. female   DOB: 09-Nov-1940 MRN: [id number]          Nutrition No diagnosis found. Past Medical History:  Diagnosis Date  . Anxiety   . Cardiac arrhythmia   . Glaucoma   . Hypertension   . Scleroderma ([place])      Meds reviewed.   Current Outpatient Medications (Cardiovascular):  Marland Kitchen  Riociguat (ADEMPAS) 1.5 MG TABS, Take 1 tablet by mouth 3 (three) times daily.   Current Outpatient Medications (Analgesics):  .  aspirin EC 81 MG tablet, Take 81 mg by mouth daily.   Current Outpatient Medications (Other):  .  famotidine (PEPCID) 20 MG tablet, One at bedtime .  omeprazole (PRILOSEC) 40 MG capsule, TAKE 1 CAPSULE BY MOUTH ONCE DAILY. TAKE 30 MINUTES BEFORE FIRST MEAL OF THE DAY  Ht: Ht Readings from Last 1 Encounters:  04/06/18 5\' 4"  (1.626 m)     Wt:  Wt Readings from Last 3 Encounters:  04/06/18 142 lb 6.7 oz (64.6 kg)  03/23/18 141 lb 8.6 oz (64.2 kg)  03/08/18 150 lb 9.2 oz (68.3 kg)     BMI: Body mass index is 24.45 kg/m.     Current tobacco use? No  Labs:  Lipid Panel  No results found for: CHOL, TRIG, HDL, CHOLHDL, VLDL, LDLCALC, LDLDIRECT  No results found for: HGBA1C Note Spoke with pt. Pt is within a normal weight for her age. Pt eats 3 meals a day; most prepared at home.  Making healthy food choices the majority of the time, but is limited by finances. Reviewed healthy food choices that are budget friendly with patient to help ensure healthy eating. Pt's Rate Your Plate results reviewed with pt. Pt does not avoid salty food; uses canned/ convenience food. Pt adds salt to food.  The role of sodium in lung disease reviewed with pt. Reviewed label reading, and recommended pt limit daily sodium intake to <1500 mg.  Pt expressed understanding of the information reviewed.   Nutrition Diagnosis ? Excessive sodium intake related to over consumption of processed food as evidenced by frequent consumption of convenience food/ canned vegetables and  eating out frequently.  Nutrition Intervention ? Pt's individual nutrition plan and goals reviewed with pt. ? Benefits of adopting healthy eating habits discussed when pt's Rate Your Plate reviewed.  Goal(s) 1. Pt to identify and limit food sources of sodium. 2. The pt will recognize symptoms that can interfere with adequate oral intake, such as shortness of breath, N/V, early satiety, fatigue, ability to secure and prepare food, taste and smell changes, chewing/swallowing difficulties, and/ or pain when eating. 3. The pt will have family and friends shop for food when necessary so that nourishing foods are always available at home.  Plan:  Pt to attend Pulmonary Nutrition class Will provide client-centered nutrition education as part of interdisciplinary care.    Monitor and Evaluate progress toward nutrition goal with team.   Laurina Bustle, MS, RD, LDN 04/06/2018 12:01 PM

## 2018-04-07 ENCOUNTER — Encounter (HOSPITAL_COMMUNITY): Payer: Self-pay

## 2018-04-07 ENCOUNTER — Encounter: Payer: Self-pay | Admitting: Nurse Practitioner

## 2018-04-07 ENCOUNTER — Ambulatory Visit (INDEPENDENT_AMBULATORY_CARE_PROVIDER_SITE_OTHER): Payer: Medicare Other | Admitting: Nurse Practitioner

## 2018-04-07 VITALS — BP 122/80 | HR 66 | Temp 98.6°F | Ht 63.4 in | Wt 138.8 lb

## 2018-04-07 DIAGNOSIS — Z09 Encounter for follow-up examination after completed treatment for conditions other than malignant neoplasm: Secondary | ICD-10-CM

## 2018-04-07 DIAGNOSIS — M25511 Pain in right shoulder: Secondary | ICD-10-CM | POA: Diagnosis not present

## 2018-04-07 MED ORDER — DICLOFENAC SODIUM 1 % TD GEL: 2.0000 g | Freq: Four times a day (QID) | TRANSDERMAL | 2 refills | Status: DC

## 2018-04-07 MED ORDER — KETOROLAC TROMETHAMINE 60 MG/2ML IM SOLN
60.0000 mg | Freq: Once | INTRAMUSCULAR | Status: AC
  Administered 2018-04-07: 60 mg via INTRAMUSCULAR

## 2018-04-07 MED ORDER — PREDNISONE 10 MG (21) PO TBPK: ORAL_TABLET | ORAL | 0 refills | Status: DC

## 2018-04-07 NOTE — Progress Notes (Signed)
Pulmonary Individual Treatment Plan  Patient Details  Name: Nancy Lambert MRN: [id number] Date of Birth: [birth year] Referring Provider:     Pulmonary Rehab Walk Test from 03/11/2018 in [place]  Referring Provider  Dr. Einar Gip      Initial Encounter Date:    Pulmonary Rehab Walk Test from 03/11/2018 in [place]  Date  03/11/18      Visit Diagnosis: Pulmonary hypertension ([place])  Patient's Home Medications on Admission:   Current Outpatient Medications:  .  aspirin EC 81 MG tablet, Take 81 mg by mouth daily., Disp: , Rfl:  .  diclofenac sodium (VOLTAREN) 1 % GEL, Apply 2 g topically 4 (four) times daily., Disp: 1 Tube, Rfl: 2 .  omeprazole (PRILOSEC) 40 MG capsule, TAKE 1 CAPSULE BY MOUTH ONCE DAILY. TAKE 30 MINUTES BEFORE FIRST MEAL OF THE DAY, Disp: 90 capsule, Rfl: 3 .  predniSONE (STERAPRED UNI-PAK 21 TAB) 10 MG (21) TBPK tablet, Take as directed, Disp: 21 tablet, Rfl: 0 .  Riociguat (ADEMPAS) 1.5 MG TABS, Take 1 tablet by mouth 3 (three) times daily., Disp: , Rfl:   Past Medical History: Past Medical History:  Diagnosis Date  . Anxiety   . Cardiac arrhythmia   . Glaucoma   . Hypertension   . 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 Scleroderma ([place])     Tobacco Use: Social History   Tobacco Use  Smoking Status Former Smoker  . Types: Cigarettes  . Last attempt to quit: 1971  . Years since quitting: 49.0  Smokeless Tobacco Never Used    Labs: Recent Chemical engineer    Labs for ITP Cardiac and Pulmonary Rehab Latest Ref Rng & Units 07/24/2015   HCO3 20.0 - 24.0 mEq/L 28.5(H)   TCO2 0 - 100 mmol/L 30   O2SAT % 66.0      Capillary Blood Glucose: No results found for: GLUCAP   Pulmonary Assessment Scores: Pulmonary Assessment Scores    Row Name 03/11/18 1613 03/11/18 1614       ADL UCSD   ADL Phase  Entry  -    SOB Score total  21  -      CAT Score   CAT Score  -  6        Pulmonary Function Assessment:   Exercise Target Goals: Exercise Program Goal: Individual exercise prescription set using results from initial 6 min walk test and THRR while considering  patient's activity barriers and safety.   Exercise Prescription Goal: Initial exercise prescription builds to 30-45 minutes a day of aerobic activity, 2-3 days per week.  Home exercise guidelines will be given to patient during program as part of exercise prescription that the participant will acknowledge.  Activity Barriers & Risk Stratification: Activity Barriers & Cardiac Risk Stratification - 03/08/18 1440      Activity Barriers & Cardiac Risk Stratification   Activity Barriers  Other (comment);Balance Concerns;History of Falls   varicose vein   Cardiac Risk Stratification  High       6 Minute Walk: 6 Minute Walk    Row Name 03/11/18 1619         6 Minute Walk   Phase  Initial     Distance  1267 feet     Walk Time  6 minutes     # of Rest Breaks  0     MPH  2.4     METS  2.84  RPE  11     Perceived Dyspnea   1     Symptoms  No     Resting HR  56 bpm     Resting BP  122/64     Resting Oxygen Saturation   96 %     Exercise Oxygen Saturation  during 6 min walk  95 %     Max Ex. HR  94 bpm     Max Ex. BP  142/68     2 Minute Post BP  126/62       Interval HR   1 Minute HR  80     2 Minute HR  88     3 Minute HR  94     4 Minute HR  93     5 Minute HR  90     6 Minute HR  93     2 Minute Post HR  65     Interval Heart Rate?  Yes       Interval Oxygen   Interval Oxygen?  Yes     Baseline Oxygen Saturation %  96 %     1 Minute Oxygen Saturation %  96 %     1 Minute Liters of Oxygen  0 L     2 Minute Oxygen Saturation %  95 %     2 Minute Liters of Oxygen  0 L     3 Minute Oxygen Saturation %  95 %     3 Minute Liters of Oxygen  0 L     4 Minute Oxygen Saturation %  95 %     4 Minute Liters of Oxygen  0 L     5 Minute Oxygen Saturation %  96 %     5 Minute Liters  of Oxygen  0 L     6 Minute Oxygen Saturation %  97 %     6 Minute Liters of Oxygen  0 L     2 Minute Post Oxygen Saturation %  96 %     2 Minute Post Liters of Oxygen  0 L        Oxygen Initial Assessment: Oxygen Initial Assessment - 03/08/18 1442      Home Oxygen   Home Oxygen Device  None    Sleep Oxygen Prescription  None    Home Exercise Oxygen Prescription  None    Home at Rest Exercise Oxygen Prescription  None      Initial 6 min Walk   Oxygen Used  None      Program Oxygen Prescription   Program Oxygen Prescription  None      Intervention   Short Term Goals  To learn and exhibit compliance with exercise, home and travel O2 prescription;To learn and understand importance of maintaining oxygen saturations>88%;To learn and demonstrate proper use of respiratory medications;To learn and understand importance of monitoring SPO2 with pulse oximeter and demonstrate accurate use of the pulse oximeter.;To learn and demonstrate proper pursed lip breathing techniques or other breathing techniques.    Long  Term Goals  Verbalizes importance of monitoring SPO2 with pulse oximeter and return demonstration;Exhibits proper breathing techniques, such as pursed lip breathing or other method taught during program session;Exhibits compliance with exercise, home and travel O2 prescription;Maintenance of O2 saturations>88%       Oxygen Re-Evaluation: Oxygen Re-Evaluation    Row Name 04/06/18 0719             Program Oxygen  Prescription   Program Oxygen Prescription  None         Home Oxygen   Home Oxygen Device  None       Sleep Oxygen Prescription  None       Home Exercise Oxygen Prescription  None       Home at Rest Exercise Oxygen Prescription  None         Goals/Expected Outcomes   Short Term Goals  To learn and exhibit compliance with exercise, home and travel O2 prescription;To learn and understand importance of maintaining oxygen saturations>88%;To learn and demonstrate  proper use of respiratory medications;To learn and understand importance of monitoring SPO2 with pulse oximeter and demonstrate accurate use of the pulse oximeter.;To learn and demonstrate proper pursed lip breathing techniques or other breathing techniques.       Long  Term Goals  Verbalizes importance of monitoring SPO2 with pulse oximeter and return demonstration;Exhibits proper breathing techniques, such as pursed lip breathing or other method taught during program session;Exhibits compliance with exercise, home and travel O2 prescription;Maintenance of O2 saturations>88%       Goals/Expected Outcomes  compliance           Oxygen Discharge (Final Oxygen Re-Evaluation): Oxygen Re-Evaluation - 04/06/18 0719      Program Oxygen Prescription   Program Oxygen Prescription  None      Home Oxygen   Home Oxygen Device  None    Sleep Oxygen Prescription  None    Home Exercise Oxygen Prescription  None    Home at Rest Exercise Oxygen Prescription  None      Goals/Expected Outcomes   Short Term Goals  To learn and exhibit compliance with exercise, home and travel O2 prescription;To learn and understand importance of maintaining oxygen saturations>88%;To learn and demonstrate proper use of respiratory medications;To learn and understand importance of monitoring SPO2 with pulse oximeter and demonstrate accurate use of the pulse oximeter.;To learn and demonstrate proper pursed lip breathing techniques or other breathing techniques.    Long  Term Goals  Verbalizes importance of monitoring SPO2 with pulse oximeter and return demonstration;Exhibits proper breathing techniques, such as pursed lip breathing or other method taught during program session;Exhibits compliance with exercise, home and travel O2 prescription;Maintenance of O2 saturations>88%    Goals/Expected Outcomes  compliance        Initial Exercise Prescription: Initial Exercise Prescription - 03/11/18 1600      Date of Initial  Exercise RX and Referring Provider   Date  03/11/18    Referring Provider  Dr. Einar Gip      Bike   Level  0.5    Minutes  17      NuStep   Level  3    SPM  90    Minutes  17      Track   Laps  13    Minutes  17      Prescription Details   Frequency (times per week)  2    Duration  Progress to 45 minutes of aerobic exercise without signs/symptoms of physical distress      Intensity   THRR 40-80% of Max Heartrate  58-116    Ratings of Perceived Exertion  11-13    Perceived Dyspnea  0-4      Progression   Progression  Continue to progress workloads to maintain intensity without signs/symptoms of physical distress.      Resistance Training   Training Prescription  Yes    Weight  orange bands  Reps  10-15       Perform Capillary Blood Glucose checks as needed.  Exercise Prescription Changes:  Exercise Prescription Changes    Row Name 03/18/18 1256 04/06/18 1500           Response to Exercise   Blood Pressure (Admit)  120/66  124/68      Blood Pressure (Exercise)  140/80  154/62      Blood Pressure (Exit)  124/70  120/70      Heart Rate (Admit)  62 bpm  71 bpm      Heart Rate (Exercise)  102 bpm  97 bpm      Heart Rate (Exit)  75 bpm  78 bpm      Oxygen Saturation (Admit)  96 %  95 %      Oxygen Saturation (Exercise)  90 %  95 %      Oxygen Saturation (Exit)  99 %  94 %      Rating of Perceived Exertion (Exercise)  11  13      Perceived Dyspnea (Exercise)  1  1      Duration  Progress to 45 minutes of aerobic exercise without signs/symptoms of physical distress  Progress to 45 minutes of aerobic exercise without signs/symptoms of physical distress      Intensity  Other (comment) 40-80 %of HRR  THRR unchanged        Progression   Progression  Continue to progress workloads to maintain intensity without signs/symptoms of physical distress.  Continue to progress workloads to maintain intensity without signs/symptoms of physical distress.        Resistance  Training   Training Prescription  Yes  Yes      Weight  orange bands  orange bands      Reps  10-15  10-15      Time  10 Minutes  10 Minutes        NuStep   Level  3  3      SPM  80  80      Minutes  17  17      METs  2.1  2        Track   Laps  13  13      Minutes  17  17         Exercise Comments:   Exercise Goals and Review:  Exercise Goals    Row Name 03/08/18 1434             Exercise Goals   Increase Physical Activity  Yes       Intervention  Provide advice, education, support and counseling about physical activity/exercise needs.;Develop an individualized exercise prescription for aerobic and resistive training based on initial evaluation findings, risk stratification, comorbidities and participant's personal goals.       Expected Outcomes  Short Term: Attend rehab on a regular basis to increase amount of physical activity.;Long Term: Exercising regularly at least 3-5 days a week.;Long Term: Add in home exercise to make exercise part of routine and to increase amount of physical activity.       Increase Strength and Stamina  Yes       Intervention  Provide advice, education, support and counseling about physical activity/exercise needs.;Develop an individualized exercise prescription for aerobic and resistive training based on initial evaluation findings, risk stratification, comorbidities and participant's personal goals.       Expected Outcomes  Short Term: Increase workloads from initial exercise prescription for  resistance, speed, and METs.;Short Term: Perform resistance training exercises routinely during rehab and add in resistance training at home;Long Term: Improve cardiorespiratory fitness, muscular endurance and strength as measured by increased METs and functional capacity (6MWT)       Able to understand and use rate of perceived exertion (RPE) scale  Yes       Intervention  Provide education and explanation on how to use RPE scale       Expected Outcomes   Short Term: Able to use RPE daily in rehab to express subjective intensity level;Long Term:  Able to use RPE to guide intensity level when exercising independently       Able to understand and use Dyspnea scale  Yes       Intervention  Provide education and explanation on how to use Dyspnea scale       Expected Outcomes  Short Term: Able to use Dyspnea scale daily in rehab to express subjective sense of shortness of breath during exertion       Knowledge and understanding of Target Heart Rate Range (THRR)  Yes       Intervention  Provide education and explanation of THRR including how the numbers were predicted and where they are located for reference       Expected Outcomes  Short Term: Able to state/look up THRR;Short Term: Able to use daily as guideline for intensity in rehab;Long Term: Able to use THRR to govern intensity when exercising independently       Understanding of Exercise Prescription  Yes       Intervention  Provide education, explanation, and written materials on patient's individual exercise prescription       Expected Outcomes  Short Term: Able to explain program exercise prescription;Long Term: Able to explain home exercise prescription to exercise independently          Exercise Goals Re-Evaluation : Exercise Goals Re-Evaluation    Row Name 04/06/18 0719 04/06/18 1500           Exercise Goal Re-Evaluation   Exercise Goals Review  Increase Physical Activity;Increase Strength and Stamina;Able to understand and use rate of perceived exertion (RPE) scale;Able to understand and use Dyspnea scale;Knowledge and understanding of Target Heart Rate Range (THRR);Understanding of Exercise Prescription  Increase Physical Activity      Comments  Pt has only attended 1 exercise session and has missed the last 4. Pt states that her daughter passed away around this time last year and that this time of year is hard on her. Unsure if pt will continue with program.   -      Expected Outcomes   Through exercise at rehab and at home, the patient will decrease shortness of breath with daily activities and feel confident in carrying out an exercise regime at home.   -         Discharge Exercise Prescription (Final Exercise Prescription Changes): Exercise Prescription Changes - 04/06/18 1500      Response to Exercise   Blood Pressure (Admit)  124/68    Blood Pressure (Exercise)  154/62    Blood Pressure (Exit)  120/70    Heart Rate (Admit)  71 bpm    Heart Rate (Exercise)  97 bpm    Heart Rate (Exit)  78 bpm    Oxygen Saturation (Admit)  95 %    Oxygen Saturation (Exercise)  95 %    Oxygen Saturation (Exit)  94 %    Rating of Perceived Exertion (Exercise)  13    Perceived Dyspnea (Exercise)  1    Duration  Progress to 45 minutes of aerobic exercise without signs/symptoms of physical distress    Intensity  THRR unchanged      Progression   Progression  Continue to progress workloads to maintain intensity without signs/symptoms of physical distress.      Resistance Training   Training Prescription  Yes    Weight  orange bands    Reps  10-15    Time  10 Minutes      NuStep   Level  3    SPM  80    Minutes  17    METs  2      Track   Laps  13    Minutes  17       Nutrition:  Target Goals: Understanding of nutrition guidelines, daily intake of sodium <157m, cholesterol <2087m calories 30% from fat and 7% or less from saturated fats, daily to have 5 or more servings of fruits and vegetables.  Biometrics:    Nutrition Therapy Plan and Nutrition Goals: Nutrition Therapy & Goals - 04/06/18 1204      Nutrition Therapy   Diet  general healthful      Personal Nutrition Goals   Nutrition Goal  Pt to identify and limit food sources of sodium    Personal Goal #2  The pt will recognize symptoms that can interfere with adequate oral intake    Personal Goal #3  The pt will have family and friends shop for food when necessary so that nourishing foods are always  available at home.      Intervention Plan   Intervention  Prescribe, educate and counsel regarding individualized specific dietary modifications aiming towards targeted core components such as weight, hypertension, lipid management, diabetes, heart failure and other comorbidities.    Expected Outcomes  Short Term Goal: Understand basic principles of dietary content, such as calories, fat, sodium, cholesterol and nutrients.;Long Term Goal: Adherence to prescribed nutrition plan.       Nutrition Assessments: Nutrition Assessments - 03/29/18 1544      Rate Your Plate Scores   Pre Score  42       Nutrition Goals Re-Evaluation:   Nutrition Goals Discharge (Final Nutrition Goals Re-Evaluation):   Psychosocial: Target Goals: Acknowledge presence or absence of significant depression and/or stress, maximize coping skills, provide positive support system. Participant is able to verbalize types and ability to use techniques and skills needed for reducing stress and depression.  Initial Review & Psychosocial Screening: Initial Psych Review & Screening - 03/08/18 1541      Family Dynamics   Comments  pt lives with daughter.   Loner by choice.  Sisters live in the area has relationship but not close       Quality of Life Scores:  Scores of 19 and below usually indicate a poorer quality of life in these areas.  A difference of  2-3 points is a clinically meaningful difference.  A difference of 2-3 points in the total score of the Quality of Life Index has been associated with significant improvement in overall quality of life, self-image, physical symptoms, and general health in studies assessing change in quality of life.  PHQ-9: Recent Review Flowsheet Data    [facility]   Decreased Interest 1   Down, Depressed, Hopeless 0   PHQ - 2 Score 1   Altered sleeping 1    Tired, decreased energy 1  Change in appetite 0   Feeling bad or failure about yourself  0    Trouble concentrating 0   Moving slowly or fidgety/restless 0   Suicidal thoughts 0   PHQ-9 Score 3   Difficult doing work/chores Not difficult at all     Interpretation of Total Score  Total Score Depression Severity:  1-4 = Minimal depression, 5-9 = Mild depression, 10-14 = Moderate depression, 15-19 = Moderately severe depression, 20-27 = Severe depression   Psychosocial Evaluation and Intervention: Psychosocial Evaluation - 04/07/18 1511      Psychosocial Evaluation & Interventions   Interventions  Stress management education;Relaxation education;Encouraged to exercise with the program and follow exercise prescription    Comments  Pt with current depression that was magnified during the holidays.  Reminder of her daughter passing is difficult during this time.    Expected Outcomes  Pt will anticipate periods of time that are most difficult for her and seek out support of family and freinds.    Continue Psychosocial Services   Follow up required by staff       Psychosocial Re-Evaluation: Psychosocial Re-Evaluation    [place] Name 04/07/18 1514             Psychosocial Re-Evaluation   Current issues with  Current Depression       Comments  Pt is doing well in group exercsie which is a surprise to rehab staff due to her behavior at orientation.  Pt engages and picks with rehab staff       Expected Outcomes  Pt will be able branch out from her loner personality and engage with freinds and families.       Interventions  Encouraged to attend Pulmonary Rehabilitation for the exercise;Stress management education;Relaxation education       Continue Psychosocial Services   Follow up required by staff          Psychosocial Discharge (Final Psychosocial Re-Evaluation): Psychosocial Re-Evaluation - 04/07/18 1514      Psychosocial Re-Evaluation   Current issues with  Current Depression    Comments  Pt is doing well in group exercsie which is a surprise to rehab staff due to her behavior  at orientation.  Pt engages and picks with rehab staff    Expected Outcomes  Pt will be able branch out from her loner personality and engage with freinds and families.    Interventions  Encouraged to attend Pulmonary Rehabilitation for the exercise;Stress management education;Relaxation education    Continue Psychosocial Services   Follow up required by staff       Education: Education Goals: Education classes will be provided on a weekly basis, covering required topics. Participant will state understanding/return demonstration of topics presented.  Learning Barriers/Preferences: Learning Barriers/Preferences - 03/08/18 1447      Learning Barriers/Preferences   Learning Barriers  Sight    Learning Preferences  Group Instruction;Individual Instruction;Written Material       Education Topics: Risk Factor Reduction:  -Group instruction that is supported by a PowerPoint presentation. Instructor discusses the definition of a risk factor, different risk factors for pulmonary disease, and how the heart and lungs work together.     Nutrition for Pulmonary Patient:  -Group instruction provided by PowerPoint slides, verbal discussion, and written materials to support subject matter. The instructor gives an explanation and review of healthy diet recommendations, which includes a discussion on weight management, recommendations for fruit and vegetable consumption, as well as protein, fluid, caffeine, fiber, sodium, sugar, and alcohol. Tips  for eating when patients are short of breath are discussed.   Pursed Lip Breathing:  -Group instruction that is supported by demonstration and informational handouts. Instructor discusses the benefits of pursed lip and diaphragmatic breathing and detailed demonstration on how to preform both.     Oxygen Safety:  -Group instruction provided by PowerPoint, verbal discussion, and written material to support subject matter. There is an overview of "What is  Oxygen" and "Why do we need it".  Instructor also reviews how to create a safe environment for oxygen use, the importance of using oxygen as prescribed, and the risks of noncompliance. There is a brief discussion on traveling with oxygen and resources the patient may utilize.   PULMONARY REHAB OTHER RESPIRATORY from 03/18/2018 in [place]  Date  03/18/18  Educator  Remo Lipps  Instruction Review Code  1- Verbalizes Understanding      Oxygen Equipment:  -Group instruction provided by [employer] utilizing handouts, written materials, and Insurance underwriter.   Signs and Symptoms:  -Group instruction provided by written material and verbal discussion to support subject matter. Warning signs and symptoms of infection, stroke, and heart attack are reviewed and when to call the physician/911 reinforced. Tips for preventing the spread of infection discussed.   Advanced Directives:  -Group instruction provided by verbal instruction and written material to support subject matter. Instructor reviews Advanced Directive laws and proper instruction for filling out document.   Pulmonary Video:  -Group video education that reviews the importance of medication and oxygen compliance, exercise, good nutrition, pulmonary hygiene, and pursed lip and diaphragmatic breathing for the pulmonary patient.   Exercise for the Pulmonary Patient:  -Group instruction that is supported by a PowerPoint presentation. Instructor discusses benefits of exercise, core components of exercise, frequency, duration, and intensity of an exercise routine, importance of utilizing pulse oximetry during exercise, safety while exercising, and options of places to exercise outside of rehab.     Pulmonary Medications:  -Verbally interactive group education provided by instructor with focus on inhaled medications and proper administration.   Anatomy and Physiology of the Respiratory System and  Intimacy:  -Group instruction provided by PowerPoint, verbal discussion, and written material to support subject matter. Instructor reviews respiratory cycle and anatomical components of the respiratory system and their functions. Instructor also reviews differences in obstructive and restrictive respiratory diseases with examples of each. Intimacy, Sex, and Sexuality differences are reviewed with a discussion on how relationships can change when diagnosed with pulmonary disease. Common sexual concerns are reviewed.   MD DAY -A group question and answer session with a medical doctor that allows participants to ask questions that relate to their pulmonary disease state.   OTHER EDUCATION -Group or individual verbal, written, or video instructions that support the educational goals of the pulmonary rehab program.   Holiday Eating Survival Tips:  -Group instruction provided by PowerPoint slides, verbal discussion, and written materials to support subject matter. The instructor gives patients tips, tricks, and techniques to help them not only survive but enjoy the holidays despite the onslaught of food that accompanies the holidays.   Knowledge Questionnaire Score: Knowledge Questionnaire Score - 03/11/18 1613      Knowledge Questionnaire Score   Pre Score  15/18       Core Components/Risk Factors/Patient Goals at Admission: Personal Goals and Risk Factors at Admission - 03/08/18 1449      Core Components/Risk Factors/Patient Goals on Admission   Improve shortness of breath with ADL's  Yes    Intervention  Provide education, individualized exercise plan and daily activity instruction to help decrease symptoms of SOB with activities of daily living.    Expected Outcomes  Short Term: Improve cardiorespiratory fitness to achieve a reduction of symptoms when performing ADLs;Long Term: Be able to perform more ADLs without symptoms or delay the onset of symptoms    Lipids  Yes    Intervention   Provide education and support for participant on nutrition & aerobic/resistive exercise along with prescribed medications to achieve LDL <52m, HDL >449m    Expected Outcomes  Short Term: Participant states understanding of desired cholesterol values and is compliant with medications prescribed. Participant is following exercise prescription and nutrition guidelines.;Long Term: Cholesterol controlled with medications as prescribed, with individualized exercise RX and with personalized nutrition plan. Value goals: LDL < 7093mHDL > 40 mg.       Core Components/Risk Factors/Patient Goals Review:  Goals and Risk Factor Review    Row Name 04/07/18 1517             Core Components/Risk Factors/Patient Goals Review   Personal Goals Review  Develop more efficient breathing techniques such as purse lipped breathing and diaphragmatic breathing and practicing self-pacing with activity.;Increase knowledge of respiratory medications and ability to use respiratory devices properly.;Improve shortness of breath with ADL's       Review  Pt has completed 2 exercise sessions and 1 education class.  Pt able to demonstrate effective PLB techniques with verbal cues. Pt workloads nustep level 3, Track 13 laps and Recumbent bike 2.0.  Will monitor pt progress during the next 30 day review.       Expected Outcomes  See Admission Goals and Outcomes          Core Components/Risk Factors/Patient Goals at Discharge (Final Review):  Goals and Risk Factor Review - 04/07/18 1517      Core Components/Risk Factors/Patient Goals Review   Personal Goals Review  Develop more efficient breathing techniques such as purse lipped breathing and diaphragmatic breathing and practicing self-pacing with activity.;Increase knowledge of respiratory medications and ability to use respiratory devices properly.;Improve shortness of breath with ADL's    Review  Pt has completed 2 exercise sessions and 1 education class.  Pt able to  demonstrate effective PLB techniques with verbal cues. Pt workloads nustep level 3, Track 13 laps and Recumbent bike 2.0.  Will monitor pt progress during the next 30 day review.    Expected Outcomes  See Admission Goals and Outcomes       ITP Comments: ITP Comments    Row Name 03/08/18 1415 04/07/18 1509         ITP Comments  Dr. WesManfred Archedical Director Pulmonary Rehab  Dr. WesManfred Archedical Director Pulmonary Rehab         Comments:  ITP REVIEW Pt is making expected progress toward personal goals after completing 2 sessions.   Recommend continued exercise, life style modification, education, and utilization of breathing texhniques to increase stamina and strength and decrease shortness of breath with exertion. CarCherre HugerSN Cardiac and PulTraining and development officer

## 2018-04-07 NOTE — Patient Instructions (Signed)
   Take Acetaminophen 500 mg or 2 (325mg ) tablets as needed every 6 hours
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
## 2018-04-08 ENCOUNTER — Telehealth (HOSPITAL_COMMUNITY): Payer: Self-pay | Admitting: Internal Medicine

## 2018-04-08 ENCOUNTER — Encounter (HOSPITAL_COMMUNITY): Payer: Medicare Other

## 2018-04-13 ENCOUNTER — Encounter (HOSPITAL_COMMUNITY)
Admission: RE | Admit: 2018-04-13 | Discharge: 2018-04-13 | Disposition: A | Discharge status: Home / Self Care | Payer: Medicare Other | Source: Ambulatory Visit | Attending: Cardiology | Admitting: Cardiology

## 2018-04-13 DIAGNOSIS — I270 Primary pulmonary hypertension: Secondary | ICD-10-CM | POA: Diagnosis not present

## 2018-04-13 DIAGNOSIS — I272 Pulmonary hypertension, unspecified: Secondary | ICD-10-CM

## 2018-04-13 NOTE — Progress Notes (Signed)
I have reviewed a Home Exercise Prescription with Nancy Lambert . Nancy Lambert is currently exercising at home.  The patient was advised to walk 3 days a week for 30 minutes.  [name] and I discussed how to progress their exercise prescription.  The patient stated that they had no specific goals.  The patient stated that they understand the exercise prescription.  We reviewed exercise guidelines, target heart rate during exercise, RPE Scale, weather conditions, NTG use, endpoints for exercise, warmup and cool down.  Patient is encouraged to come to me with any questions. I will continue to follow up with the patient to assist them with progression and safety.

## 2018-04-13 NOTE — Progress Notes (Signed)
Daily Session Note  Patient Details  Name: Nancy Lambert MRN: [id number] Date of Birth: 1940-04-01 Referring Provider:     Pulmonary Rehab Walk Test from 03/11/2018 in [name]  Referring Provider  Dr. Einar Gip      Encounter Date: 04/13/2018  Check In: Session Check In - 04/13/18 1230      Check-In   Supervising physician immediately available to respond to emergencies  [facility] immediately available    Physician(s)  Dr. Reesa Chew    Location  [facility]    Staff Present  Joycelyn Man RN, Maxcine Ham, RN, BSN;Carlette Wilber Oliphant, RN, BSN;Lisa Ysidro Evert, RN;Dalton Fletcher, MS, Exercise Physiologist    Medication changes reported      No    Fall or balance concerns reported     No    Tobacco Cessation  No Change    Warm-up and Cool-down  Performed as group-led instruction    Resistance Training Performed  Yes    VAD Patient?  No    PAD/SET Patient?  No      Pain Assessment   Currently in Pain?  No/denies    Pain Score  0-No pain    Multiple Pain Sites  No       Capillary Blood Glucose: No results found for this or any previous visit (from the past 24 hour(s)).  Exercise Prescription Changes - 04/13/18 1200      Home Exercise Plan   Plans to continue exercise at  Home (comment)    Frequency  Add 3 additional days to program exercise sessions.    Initial Home Exercises Provided  04/13/18       Social History   Tobacco Use  Smoking Status Former Smoker  . Types: Cigarettes  . Last attempt to quit: 1971  . Years since quitting: 49.0  Smokeless Tobacco Never Used    Goals Met:  Proper associated with RPD/PD & O2 Sat Exercise tolerated well Strength training completed today  Goals Unmet:  Not Applicable  Comments: Service time is from 1030 to 1205    Dr. Rush Farmer is Medical Director for Pulmonary Rehab at [facility].

## 2018-04-13 NOTE — Progress Notes (Signed)
VARIE MACHAMER 78 y.o. female   DOB: 06/07/1940 MRN: [id number]          Nutrition No diagnosis found. Past Medical History:  Diagnosis Date  . Anxiety   . Cardiac arrhythmia   . Glaucoma   . Hypertension   . Scleroderma ([place])      Meds reviewed.   Current Outpatient Medications (Cardiovascular):  Marland Kitchen  Riociguat (ADEMPAS) 1.5 MG TABS, Take 1 tablet by mouth 3 (three) times daily.   Current Outpatient Medications (Analgesics):  .  aspirin EC 81 MG tablet, Take 81 mg by mouth daily.   Current Outpatient Medications (Other):  .  famotidine (PEPCID) 20 MG tablet, One at bedtime .  omeprazole (PRILOSEC) 40 MG capsule, TAKE 1 CAPSULE BY MOUTH ONCE DAILY. TAKE 30 MINUTES BEFORE FIRST MEAL OF THE DAY  Ht: Ht Readings from Last 1 Encounters:  04/06/18 5\' 4"  (1.626 m)     Wt:  Wt Readings from Last 3 Encounters:  04/06/18 142 lb 6.7 oz (64.6 kg)  03/23/18 141 lb 8.6 oz (64.2 kg)  03/08/18 150 lb 9.2 oz (68.3 kg)     BMI: Body mass index is 24.45 kg/m.     Current tobacco use? No  Labs:  Lipid Panel  No results found for: CHOL, TRIG, HDL, CHOLHDL, VLDL, LDLCALC, LDLDIRECT  No results found for: HGBA1C Note Spoke with pt. Pt is within a normal weight for her age. Pt continues to eat 3 meals a day prepared at home. Pt's healthy eating continues to be limited by finances. Reviewed resources in the community to help with this. Since alst discussion pt has started to incorporated healthy food choices that are budget friendly to increase healthy eating. Pt's Rate Your Plate results reviewed with pt. Pt has started to read labels and develop an awareness for sodium in foods. Praised pt for this changed and encouraged her to keep up the great work. Pt expressed understanding of the information reviewed.   Nutrition Diagnosis ? Excessive sodium intake related to over consumption of processed food as evidenced by frequent consumption of convenience food/ canned vegetables and eating  out frequently.  Nutrition Intervention ? Pt's individual nutrition plan and goals reviewed with pt. ? Benefits of adopting healthy eating habits discussed when pt's Rate Your Plate reviewed.  Goal(s) 1. Pt to identify and limit food sources of sodium. 2. The pt will recognize symptoms that can interfere with adequate oral intake, such as shortness of breath, N/V, early satiety, fatigue, ability to secure and prepare food, taste and smell changes, chewing/swallowing difficulties, and/ or pain when eating. 3. The pt will have family and friends shop for food when necessary so that nourishing foods are always available at home.  Plan:  Pt to attend Pulmonary Nutrition class Will provide client-centered nutrition education as part of interdisciplinary care.    Monitor and Evaluate progress toward nutrition goal with team.   Laurina Bustle, MS, RD, LDN 04/06/2018 12:01 PM

## 2018-04-15 ENCOUNTER — Encounter (HOSPITAL_COMMUNITY)
Admission: RE | Admit: 2018-04-15 | Discharge: 2018-04-15 | Disposition: A | Discharge status: Home / Self Care | Payer: Medicare Other | Source: Ambulatory Visit | Attending: Cardiology | Admitting: Cardiology

## 2018-04-15 DIAGNOSIS — I272 Pulmonary hypertension, unspecified: Secondary | ICD-10-CM

## 2018-04-15 DIAGNOSIS — I270 Primary pulmonary hypertension: Secondary | ICD-10-CM | POA: Diagnosis not present

## 2018-04-15 NOTE — Progress Notes (Signed)
Daily Session Note  Patient Details  Name: Nancy Lambert MRN: [id number] Date of Birth: 1941-02-11 Referring Provider:     Pulmonary Rehab Walk Test from 03/11/2018 in [place]  Referring Provider  Dr. Einar Gip      Encounter Date: 04/15/2018  Check In: Session Check In - 04/15/18 1030      Check-In   Supervising physician immediately available to respond to emergencies  [facility] immediately available    Physician(s)  Dr. Waldron Labs    Location  [facility]    Staff Present  Joycelyn Man RN, BSN;Carlette Wilber Oliphant, RN, Bjorn Loser, MS, Exercise Physiologist;Lisa Ysidro Evert, RN;Cheria Sadiq Leonia Reeves, RN, BSN    Medication changes reported      No    Fall or balance concerns reported     No    Tobacco Cessation  No Change    Warm-up and Cool-down  Performed as group-led instruction    Resistance Training Performed  Yes    VAD Patient?  No    PAD/SET Patient?  No      Pain Assessment   Currently in Pain?  No/denies    Multiple Pain Sites  No       Capillary Blood Glucose: No results found for this or any previous visit (from the past 24 hour(s)).    Social History   Tobacco Use  Smoking Status Former Smoker  . Types: Cigarettes  . Last attempt to quit: 1971  . Years since quitting: 49.0  Smokeless Tobacco Never Used    Goals Met:  Proper associated with RPD/PD & O2 Sat Exercise tolerated well Strength training completed today  Goals Unmet:  Not Applicable  Comments: Service time is from 1030 to 1220   Dr. Rush Farmer is Medical Director for Pulmonary Rehab at [facility].

## 2018-04-20 ENCOUNTER — Encounter (HOSPITAL_COMMUNITY)
Admission: RE | Admit: 2018-04-20 | Discharge: 2018-04-20 | Disposition: A | Discharge status: Home / Self Care | Payer: Medicare Other | Source: Ambulatory Visit | Attending: Cardiology | Admitting: Cardiology

## 2018-04-20 VITALS — Wt 139.8 lb

## 2018-04-20 DIAGNOSIS — I272 Pulmonary hypertension, unspecified: Secondary | ICD-10-CM

## 2018-04-20 DIAGNOSIS — I270 Primary pulmonary hypertension: Secondary | ICD-10-CM | POA: Diagnosis not present

## 2018-04-20 NOTE — Progress Notes (Signed)
Daily Session Note  Patient Details  Name: Nancy Lambert MRN: [id number] Date of Birth: 04/05/1940 Referring Provider:     Pulmonary Rehab Walk Test from 03/11/2018 in [place]  Referring Provider  Dr. Einar Gip      Encounter Date: 04/20/2018  Check In: Session Check In - 04/20/18 1030      Check-In   Supervising physician immediately available to respond to emergencies  [facility] immediately available    Physician(s)  Dr. Waldron Labs    Location  [facility]    Staff Present  Joycelyn Man RN, BSN;Carlette Wilber Oliphant, RN, Bjorn Loser, MS, Exercise Physiologist;Lola Czerwonka Leonia Reeves, RN, BSN    Medication changes reported      No    Fall or balance concerns reported     No    Tobacco Cessation  No Change    Warm-up and Cool-down  Performed as group-led instruction    Resistance Training Performed  Yes    VAD Patient?  No    PAD/SET Patient?  No      Pain Assessment   Currently in Pain?  No/denies    Multiple Pain Sites  No       Capillary Blood Glucose: No results found for this or any previous visit (from the past 24 hour(s)).  Exercise Prescription Changes - 04/20/18 1200      Response to Exercise   Blood Pressure (Admit)  116/56    Blood Pressure (Exercise)  132/74    Blood Pressure (Exit)  130/76    Heart Rate (Admit)  74 bpm    Heart Rate (Exercise)  96 bpm    Heart Rate (Exit)  72 bpm    Oxygen Saturation (Admit)  94 %    Oxygen Saturation (Exercise)  95 %    Oxygen Saturation (Exit)  95 %    Rating of Perceived Exertion (Exercise)  11    Perceived Dyspnea (Exercise)  0    Duration  Progress to 45 minutes of aerobic exercise without signs/symptoms of physical distress      Progression   Progression  Continue to progress workloads to maintain intensity without signs/symptoms of physical distress.      Resistance Training   Training Prescription  Yes    Weight  orange bands    Reps  10-15    Time  10  Minutes      Bike   Level  0.5    Minutes  17      Track   Laps  13    Minutes  17       Social History   Tobacco Use  Smoking Status Former Smoker  . Types: Cigarettes  . Last attempt to quit: 1971  . Years since quitting: 49.0  Smokeless Tobacco Never Used    Goals Met:  Proper associated with RPD/PD & O2 Sat Exercise tolerated well Strength training completed today  Goals Unmet:  Not Applicable  Comments: Service time is from 1030 to 1210    Dr. Rush Farmer is Medical Director for Pulmonary Rehab at [facility].

## 2018-04-22 ENCOUNTER — Encounter (HOSPITAL_COMMUNITY)
Admission: RE | Admit: 2018-04-22 | Discharge: 2018-04-22 | Disposition: A | Discharge status: Home / Self Care | Payer: Medicare Other | Source: Ambulatory Visit | Attending: Cardiology | Admitting: Cardiology

## 2018-04-22 DIAGNOSIS — I270 Primary pulmonary hypertension: Secondary | ICD-10-CM | POA: Diagnosis not present

## 2018-04-22 DIAGNOSIS — I272 Pulmonary hypertension, unspecified: Secondary | ICD-10-CM

## 2018-04-22 NOTE — Progress Notes (Signed)
Daily Session Note  Patient Details  Name: Nancy Lambert MRN: [id number] Date of Birth: [birth year] Referring Provider:     Pulmonary Rehab Walk Test from 03/11/2018 in [place]  Referring Provider  Dr. Einar Gip      Encounter Date: 04/22/2018  Check In: Session Check In - 04/22/18 1051      Check-In   Supervising physician immediately available to respond to emergencies  [facility] immediately available    Physician(s)  Dr. Teryl Lucy    Location  [facility]    Staff Present  Joycelyn Man RN, BSN;Carlette Wilber Oliphant, RN, Bjorn Loser, MS, Exercise Physiologist;Joan Leonia Reeves, RN, BSN;Ramon Dredge, RN, MHA    Medication changes reported      No    Fall or balance concerns reported     No    Tobacco Cessation  No Change    Warm-up and Cool-down  Performed as group-led instruction    Resistance Training Performed  Yes    VAD Patient?  No    PAD/SET Patient?  No      Pain Assessment   Currently in Pain?  No/denies    Pain Score  0-No pain    Multiple Pain Sites  No       Capillary Blood Glucose: No results found for this or any previous visit (from the past 24 hour(s)).    Social History   Tobacco Use  Smoking Status Former Smoker  . Types: Cigarettes  . Last attempt to quit: 1971  . Years since quitting: 49.0  Smokeless Tobacco Never Used    Goals Met:  Proper associated with RPD/PD & O2 Sat Exercise tolerated well Strength training completed today  Goals Unmet:  Not Applicable  Comments: Service time is from 1030 to 1205   Dr. Rush Farmer is Medical Director for Pulmonary Rehab at [facility].

## 2018-04-27 ENCOUNTER — Encounter (HOSPITAL_COMMUNITY)
Admission: RE | Admit: 2018-04-27 | Discharge: 2018-04-27 | Disposition: A | Discharge status: Home / Self Care | Payer: Medicare Other | Source: Ambulatory Visit | Attending: Cardiology | Admitting: Cardiology

## 2018-04-27 ENCOUNTER — Encounter: Payer: Self-pay | Admitting: Nurse Practitioner

## 2018-04-27 DIAGNOSIS — I270 Primary pulmonary hypertension: Secondary | ICD-10-CM | POA: Diagnosis not present

## 2018-04-27 DIAGNOSIS — I272 Pulmonary hypertension, unspecified: Secondary | ICD-10-CM

## 2018-04-27 NOTE — Progress Notes (Signed)
Daily Session Note  Patient Details  Name: Nancy Lambert MRN: [id number] Date of Birth: 1940/09/01 Referring Provider:     Pulmonary Rehab Walk Test from 03/11/2018 in [place]  Referring Provider  Dr. Einar Gip      Encounter Date: 04/27/2018  Check In: Session Check In - 04/27/18 1030      Check-In   Supervising physician immediately available to respond to emergencies  [facility] immediately available    Physician(s)  Dr. Teryl Lucy    Location  [facility]    Staff Present  Joycelyn Man RN, BSN;Molly DiVincenzo, MS, ACSM RCEP, Exercise Physiologist;Dalton Kris Mouton, MS, Exercise Physiologist;Tiffany Talarico Leonia Reeves, RN, BSN;Carlette Wilber Oliphant, RN, Roque Cash, RN    Medication changes reported      No    Fall or balance concerns reported     No    Tobacco Cessation  No Change    Warm-up and Cool-down  Performed as group-led instruction    Resistance Training Performed  Yes    VAD Patient?  No    PAD/SET Patient?  No      Pain Assessment   Multiple Pain Sites  No       Capillary Blood Glucose: No results found for this or any previous visit (from the past 24 hour(s)).    Social History   Tobacco Use  Smoking Status Former Smoker  . Types: Cigarettes  . Last attempt to quit: 1971  . Years since quitting: 49.1  Smokeless Tobacco Never Used    Goals Met:  Proper associated with RPD/PD & O2 Sat Exercise tolerated well Strength training completed today  Goals Unmet:  Not Applicable  Comments: Service time is from 1030 to [place]    Dr. Rush Farmer is Medical Director for Pulmonary Rehab at [facility].

## 2018-04-29 ENCOUNTER — Encounter (HOSPITAL_COMMUNITY)
Admission: RE | Admit: 2018-04-29 | Discharge: 2018-04-29 | Disposition: A | Discharge status: Home / Self Care | Payer: Medicare Other | Source: Ambulatory Visit | Attending: Cardiology | Admitting: Cardiology

## 2018-04-29 VITALS — Wt 141.8 lb

## 2018-04-29 DIAGNOSIS — I270 Primary pulmonary hypertension: Secondary | ICD-10-CM | POA: Diagnosis not present

## 2018-04-29 DIAGNOSIS — I272 Pulmonary hypertension, unspecified: Secondary | ICD-10-CM

## 2018-04-29 NOTE — Progress Notes (Signed)
Daily Session Note  Patient Details  Name: Nancy Lambert MRN: [id number] Date of Birth: 1941/03/26 Referring Provider:     Pulmonary Rehab Walk Test from 03/11/2018 in [place]  Referring Provider  Dr. Einar Gip      Encounter Date: 04/29/2018  Check In: Session Check In - 04/29/18 1107      Check-In   Supervising physician immediately available to respond to emergencies  [facility] immediately available    Physician(s)  Dr. Waldron Labs    Location  [facility]    Staff Present  Joycelyn Man RN, BSN;Molly DiVincenzo, MS, ACSM RCEP, Exercise Physiologist;Dalton Kris Mouton, MS, Exercise Physiologist;Carlette Wilber Oliphant, RN, Roque Cash, RN    Medication changes reported      No    Fall or balance concerns reported     No    Tobacco Cessation  No Change    Warm-up and Cool-down  Performed as group-led instruction    Resistance Training Performed  Yes    VAD Patient?  No    PAD/SET Patient?  No      Pain Assessment   Currently in Pain?  No/denies    Multiple Pain Sites  No       Capillary Blood Glucose: No results found for this or any previous visit (from the past 24 hour(s)).    Social History   Tobacco Use  Smoking Status Former Smoker  . Types: Cigarettes  . Last attempt to quit: 1971  . Years since quitting: 49.1  Smokeless Tobacco Never Used    Goals Met:  Exercise tolerated well No report of cardiac concerns or symptoms Strength training completed today  Goals Unmet:  Not Applicable  Comments: Service time is from 1030 to 1225    Dr. Rush Farmer is Medical Director for Pulmonary Rehab at [facility]

## 2018-05-04 ENCOUNTER — Encounter (HOSPITAL_COMMUNITY): Payer: Medicare Other

## 2018-05-04 ENCOUNTER — Encounter: Payer: Self-pay | Admitting: Cardiology

## 2018-05-04 ENCOUNTER — Encounter (HOSPITAL_COMMUNITY): Payer: Self-pay

## 2018-05-04 ENCOUNTER — Ambulatory Visit (INDEPENDENT_AMBULATORY_CARE_PROVIDER_SITE_OTHER): Payer: Medicare Other | Admitting: Cardiology

## 2018-05-04 VITALS — BP 134/63 | HR 68 | Ht 64.0 in | Wt 146.1 lb

## 2018-05-04 DIAGNOSIS — I272 Pulmonary hypertension, unspecified: Secondary | ICD-10-CM | POA: Diagnosis not present

## 2018-05-04 DIAGNOSIS — R0609 Other forms of dyspnea: Secondary | ICD-10-CM | POA: Diagnosis not present

## 2018-05-04 DIAGNOSIS — R1319 Other dysphagia: Secondary | ICD-10-CM

## 2018-05-04 DIAGNOSIS — R0600 Dyspnea, unspecified: Secondary | ICD-10-CM

## 2018-05-04 DIAGNOSIS — R002 Palpitations: Secondary | ICD-10-CM | POA: Diagnosis not present

## 2018-05-04 DIAGNOSIS — M349 Systemic sclerosis, unspecified: Secondary | ICD-10-CM

## 2018-05-04 NOTE — Progress Notes (Signed)
Pulmonary Individual Treatment Plan  Patient Details  Name: Nancy Lambert MRN: [id number] Date of Birth: May 17, 1940 Referring Provider:     Pulmonary Rehab Walk Test from 03/11/2018 in [facility]  Referring Provider  Dr. Einar Gip      Initial Encounter Date:    Pulmonary Rehab Walk Test from 03/11/2018 in [facility]  Date  03/11/18      Visit Diagnosis: Pulmonary hypertension ([place])  Patient's Home Medications on Admission:   Current Outpatient Medications:  .  aspirin EC 81 MG tablet, Take 81 mg by mouth daily., Disp: , Rfl:  .  diclofenac sodium (VOLTAREN) 1 % GEL, Apply 2 g topically 4 (four) times daily., Disp: 1 Tube, Rfl: 2 .  omeprazole (PRILOSEC) 40 MG capsule, TAKE 1 CAPSULE BY MOUTH ONCE DAILY. TAKE 30 MINUTES BEFORE FIRST MEAL OF THE DAY, Disp: 90 capsule, Rfl: 3 .  Riociguat (ADEMPAS) 1.5 MG TABS, Take 1 tablet by mouth 3 (three) times daily., Disp: , Rfl:   Past Medical History: Past Medical History:  Diagnosis Date  . Anxiety   . Cardiac arrhythmia   . Glaucoma   . Hypertension   . 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 Scleroderma ([place])     Tobacco Use: Social History   Tobacco Use  Smoking Status Former Smoker  . Types: Cigarettes  . Last attempt to quit: 1971  . Years since quitting: 49.1  Smokeless Tobacco Never Used    Labs: Recent Chemical engineer    Labs for ITP Cardiac and Pulmonary Rehab Latest Ref Rng & Units 07/24/2015   HCO3 20.0 - 24.0 mEq/L 28.5(H)   TCO2 0 - 100 mmol/L 30   O2SAT % 66.0      Capillary Blood Glucose: No results found for: GLUCAP   Pulmonary Assessment Scores: Pulmonary Assessment Scores    Row Name 03/11/18 1613 03/11/18 1614       ADL UCSD   ADL Phase  Entry  -    SOB Score total  21  -      CAT Score   CAT Score  -  6       Pulmonary Function Assessment:   Exercise Target Goals: Exercise Program Goal: Individual exercise  prescription set using results from initial 6 min walk test and THRR while considering  patient's activity barriers and safety.   Exercise Prescription Goal: Initial exercise prescription builds to 30-45 minutes a day of aerobic activity, 2-3 days per week.  Home exercise guidelines will be given to patient during program as part of exercise prescription that the participant will acknowledge.  Activity Barriers & Risk Stratification: Activity Barriers & Cardiac Risk Stratification - 03/08/18 1440      Activity Barriers & Cardiac Risk Stratification   Activity Barriers  Other (comment);Balance Concerns;History of Falls   varicose vein   Cardiac Risk Stratification  High       6 Minute Walk: 6 Minute Walk    Row Name 03/11/18 1619         6 Minute Walk   Phase  Initial     Distance  1267 feet     Walk Time  6 minutes     # of Rest Breaks  0     MPH  2.4     METS  2.84     RPE  11     Perceived Dyspnea   1     Symptoms  No  Resting HR  56 bpm     Resting BP  122/64     Resting Oxygen Saturation   96 %     Exercise Oxygen Saturation  during 6 min walk  95 %     Max Ex. HR  94 bpm     Max Ex. BP  142/68     2 Minute Post BP  126/62       Interval HR   1 Minute HR  80     2 Minute HR  88     3 Minute HR  94     4 Minute HR  93     5 Minute HR  90     6 Minute HR  93     2 Minute Post HR  65     Interval Heart Rate?  Yes       Interval Oxygen   Interval Oxygen?  Yes     Baseline Oxygen Saturation %  96 %     1 Minute Oxygen Saturation %  96 %     1 Minute Liters of Oxygen  0 L     2 Minute Oxygen Saturation %  95 %     2 Minute Liters of Oxygen  0 L     3 Minute Oxygen Saturation %  95 %     3 Minute Liters of Oxygen  0 L     4 Minute Oxygen Saturation %  95 %     4 Minute Liters of Oxygen  0 L     5 Minute Oxygen Saturation %  96 %     5 Minute Liters of Oxygen  0 L     6 Minute Oxygen Saturation %  97 %     6 Minute Liters of Oxygen  0 L     2 Minute Post  Oxygen Saturation %  96 %     2 Minute Post Liters of Oxygen  0 L        Oxygen Initial Assessment: Oxygen Initial Assessment - 03/08/18 1442      Home Oxygen   Home Oxygen Device  None    Sleep Oxygen Prescription  None    Home Exercise Oxygen Prescription  None    Home at Rest Exercise Oxygen Prescription  None      Initial 6 min Walk   Oxygen Used  None      Program Oxygen Prescription   Program Oxygen Prescription  None      Intervention   Short Term Goals  To learn and exhibit compliance with exercise, home and travel O2 prescription;To learn and understand importance of maintaining oxygen saturations>88%;To learn and demonstrate proper use of respiratory medications;To learn and understand importance of monitoring SPO2 with pulse oximeter and demonstrate accurate use of the pulse oximeter.;To learn and demonstrate proper pursed lip breathing techniques or other breathing techniques.    Long  Term Goals  Verbalizes importance of monitoring SPO2 with pulse oximeter and return demonstration;Exhibits proper breathing techniques, such as pursed lip breathing or other method taught during program session;Exhibits compliance with exercise, home and travel O2 prescription;Maintenance of O2 saturations>88%       Oxygen Re-Evaluation: Oxygen Re-Evaluation    Row Name 04/06/18 0719 05/04/18 0734           Program Oxygen Prescription   Program Oxygen Prescription  None  None        Home Oxygen   Home Oxygen  Device  None  None      Sleep Oxygen Prescription  None  None      Home Exercise Oxygen Prescription  None  None      Home at Rest Exercise Oxygen Prescription  None  None        Goals/Expected Outcomes   Short Term Goals  To learn and exhibit compliance with exercise, home and travel O2 prescription;To learn and understand importance of maintaining oxygen saturations>88%;To learn and demonstrate proper use of respiratory medications;To learn and understand importance of  monitoring SPO2 with pulse oximeter and demonstrate accurate use of the pulse oximeter.;To learn and demonstrate proper pursed lip breathing techniques or other breathing techniques.  To learn and exhibit compliance with exercise, home and travel O2 prescription;To learn and understand importance of maintaining oxygen saturations>88%;To learn and demonstrate proper use of respiratory medications;To learn and understand importance of monitoring SPO2 with pulse oximeter and demonstrate accurate use of the pulse oximeter.;To learn and demonstrate proper pursed lip breathing techniques or other breathing techniques.      Long  Term Goals  Verbalizes importance of monitoring SPO2 with pulse oximeter and return demonstration;Exhibits proper breathing techniques, such as pursed lip breathing or other method taught during program session;Exhibits compliance with exercise, home and travel O2 prescription;Maintenance of O2 saturations>88%  Verbalizes importance of monitoring SPO2 with pulse oximeter and return demonstration;Exhibits proper breathing techniques, such as pursed lip breathing or other method taught during program session;Exhibits compliance with exercise, home and travel O2 prescription;Maintenance of O2 saturations>88%      Goals/Expected Outcomes  compliance   compliance          Oxygen Discharge (Final Oxygen Re-Evaluation): Oxygen Re-Evaluation - 05/04/18 0734      Program Oxygen Prescription   Program Oxygen Prescription  None      Home Oxygen   Home Oxygen Device  None    Sleep Oxygen Prescription  None    Home Exercise Oxygen Prescription  None    Home at Rest Exercise Oxygen Prescription  None      Goals/Expected Outcomes   Short Term Goals  To learn and exhibit compliance with exercise, home and travel O2 prescription;To learn and understand importance of maintaining oxygen saturations>88%;To learn and demonstrate proper use of respiratory medications;To learn and understand  importance of monitoring SPO2 with pulse oximeter and demonstrate accurate use of the pulse oximeter.;To learn and demonstrate proper pursed lip breathing techniques or other breathing techniques.    Long  Term Goals  Verbalizes importance of monitoring SPO2 with pulse oximeter and return demonstration;Exhibits proper breathing techniques, such as pursed lip breathing or other method taught during program session;Exhibits compliance with exercise, home and travel O2 prescription;Maintenance of O2 saturations>88%    Goals/Expected Outcomes  compliance        Initial Exercise Prescription: Initial Exercise Prescription - 03/11/18 1600      Date of Initial Exercise RX and Referring Provider   Date  03/11/18    Referring Provider  Dr. Einar Gip      Bike   Level  0.5    Minutes  17      NuStep   Level  3    SPM  90    Minutes  17      Track   Laps  13    Minutes  17      Prescription Details   Frequency (times per week)  2    Duration  Progress to 45 minutes of aerobic  exercise without signs/symptoms of physical distress      Intensity   THRR 40-80% of Max Heartrate  58-116    Ratings of Perceived Exertion  11-13    Perceived Dyspnea  0-4      Progression   Progression  Continue to progress workloads to maintain intensity without signs/symptoms of physical distress.      Resistance Training   Training Prescription  Yes    Weight  orange bands    Reps  10-15       Perform Capillary Blood Glucose checks as needed.  Exercise Prescription Changes:  Exercise Prescription Changes    Row Name 03/18/18 1256 04/06/18 1500 04/13/18 1200 04/20/18 1200 05/04/18 1200     Response to Exercise   Blood Pressure (Admit)  120/66  124/68  -  116/56  106/60   Blood Pressure (Exercise)  140/80  154/62  -  132/74  124/60   Blood Pressure (Exit)  124/70  120/70  -  130/76  128/64   Heart Rate (Admit)  62 bpm  71 bpm  -  74 bpm  72 bpm   Heart Rate (Exercise)  102 bpm  97 bpm  -  96 bpm   96 bpm   Heart Rate (Exit)  75 bpm  78 bpm  -  72 bpm  71 bpm   Oxygen Saturation (Admit)  96 %  95 %  -  94 %  94 %   Oxygen Saturation (Exercise)  90 %  95 %  -  95 %  95 %   Oxygen Saturation (Exit)  99 %  94 %  -  95 %  97 %   Rating of Perceived Exertion (Exercise)  11  13  -  11  13   Perceived Dyspnea (Exercise)  1  1  -  0  2   Duration  Progress to 45 minutes of aerobic exercise without signs/symptoms of physical distress  Progress to 45 minutes of aerobic exercise without signs/symptoms of physical distress  -  Progress to 45 minutes of aerobic exercise without signs/symptoms of physical distress  Progress to 45 minutes of aerobic exercise without signs/symptoms of physical distress   Intensity  Other (comment) 40-80 %of HRR  THRR unchanged  -  -  THRR unchanged     Progression   Progression  Continue to progress workloads to maintain intensity without signs/symptoms of physical distress.  Continue to progress workloads to maintain intensity without signs/symptoms of physical distress.  -  Continue to progress workloads to maintain intensity without signs/symptoms of physical distress.  Continue to progress workloads to maintain intensity without signs/symptoms of physical distress.     Resistance Training   Training Prescription  Yes  Yes  -  Yes  Yes   Weight  orange bands  orange bands  -  orange bands  orange bands   Reps  10-15  10-15  -  10-15  10-15   Time  10 Minutes  10 Minutes  -  10 Minutes  10 Minutes     Interval Training   Interval Training  -  -  -  -  No     Bike   Level  -  -  -  0.5  3   Minutes  -  -  -  17  17     NuStep   Level  3  3  -  -  4   SPM  80  80  -  -  80   Minutes  17  17  -  -  17   METs  2.1  2  -  -  2.9     Track   Laps  13  13  -  13  -   Minutes  17  17  -  17  -     Home Exercise Plan   Plans to continue exercise at  -  -  Home (comment)  -  -   Frequency  -  -  Add 3 additional days to program exercise sessions.  -  -   Initial  Home Exercises Provided  -  -  04/13/18  -  -      Exercise Comments:  Exercise Comments    Row Name 04/13/18 1208           Exercise Comments  home exercise complete          Exercise Goals and Review:  Exercise Goals    [place] Name 03/08/18 1434             Exercise Goals   Increase Physical Activity  Yes       Intervention  Provide advice, education, support and counseling about physical activity/exercise needs.;Develop an individualized exercise prescription for aerobic and resistive training based on initial evaluation findings, risk stratification, comorbidities and participant's personal goals.       Expected Outcomes  Short Term: Attend rehab on a regular basis to increase amount of physical activity.;Long Term: Exercising regularly at least 3-5 days a week.;Long Term: Add in home exercise to make exercise part of routine and to increase amount of physical activity.       Increase Strength and Stamina  Yes       Intervention  Provide advice, education, support and counseling about physical activity/exercise needs.;Develop an individualized exercise prescription for aerobic and resistive training based on initial evaluation findings, risk stratification, comorbidities and participant's personal goals.       Expected Outcomes  Short Term: Increase workloads from initial exercise prescription for resistance, speed, and METs.;Short Term: Perform resistance training exercises routinely during rehab and add in resistance training at home;Long Term: Improve cardiorespiratory fitness, muscular endurance and strength as measured by increased METs and functional capacity (6MWT)       Able to understand and use rate of perceived exertion (RPE) scale  Yes       Intervention  Provide education and explanation on how to use RPE scale       Expected Outcomes  Short Term: Able to use RPE daily in rehab to express subjective intensity level;Long Term:  Able to use RPE to guide intensity level  when exercising independently       Able to understand and use Dyspnea scale  Yes       Intervention  Provide education and explanation on how to use Dyspnea scale       Expected Outcomes  Short Term: Able to use Dyspnea scale daily in rehab to express subjective sense of shortness of breath during exertion       Knowledge and understanding of Target Heart Rate Range (THRR)  Yes       Intervention  Provide education and explanation of THRR including how the numbers were predicted and where they are located for reference       Expected Outcomes  Short Term: Able to state/look up THRR;Short Term: Able to use daily as guideline  for intensity in rehab;Long Term: Able to use THRR to govern intensity when exercising independently       Understanding of Exercise Prescription  Yes       Intervention  Provide education, explanation, and written materials on patient's individual exercise prescription       Expected Outcomes  Short Term: Able to explain program exercise prescription;Long Term: Able to explain home exercise prescription to exercise independently          Exercise Goals Re-Evaluation : Exercise Goals Re-Evaluation    Row Name 04/06/18 0719 04/06/18 1500 05/04/18 0734         Exercise Goal Re-Evaluation   Exercise Goals Review  Increase Physical Activity;Increase Strength and Stamina;Able to understand and use rate of perceived exertion (RPE) scale;Able to understand and use Dyspnea scale;Knowledge and understanding of Target Heart Rate Range (THRR);Understanding of Exercise Prescription  Increase Physical Activity  Increase Physical Activity;Increase Strength and Stamina;Able to understand and use rate of perceived exertion (RPE) scale;Able to understand and use Dyspnea scale;Knowledge and understanding of Target Heart Rate Range (THRR);Improve claudication pain tolerance and improve walking ability     Comments  Pt has only attended 1 exercise session and has missed the last 4. Pt states  that her daughter passed away around this time last year and that this time of year is hard on her. Unsure if pt will continue with program.   -  Pt has now been coming to rehab reguarly. When pt pushes herself she can walk 20 laps (1 lap=200 ft) around the track in 15 minutes. Pt is reluctant towards workload increases but will accept them. Will continue to monitor and progress as able.      Expected Outcomes  Through exercise at rehab and at home, the patient will decrease shortness of breath with daily activities and feel confident in carrying out an exercise regime at home.   -  Through exercise at rehab and at home, the patient will decrease shortness of breath with daily activities and feel confident in carrying out an exercise regime at home.         Discharge Exercise Prescription (Final Exercise Prescription Changes): Exercise Prescription Changes - 05/04/18 1200      Response to Exercise   Blood Pressure (Admit)  106/60    Blood Pressure (Exercise)  124/60    Blood Pressure (Exit)  128/64    Heart Rate (Admit)  72 bpm    Heart Rate (Exercise)  96 bpm    Heart Rate (Exit)  71 bpm    Oxygen Saturation (Admit)  94 %    Oxygen Saturation (Exercise)  95 %    Oxygen Saturation (Exit)  97 %    Rating of Perceived Exertion (Exercise)  13    Perceived Dyspnea (Exercise)  2    Duration  Progress to 45 minutes of aerobic exercise without signs/symptoms of physical distress    Intensity  THRR unchanged      Progression   Progression  Continue to progress workloads to maintain intensity without signs/symptoms of physical distress.      Resistance Training   Training Prescription  Yes    Weight  orange bands    Reps  10-15    Time  10 Minutes      Interval Training   Interval Training  No      Bike   Level  3    Minutes  17      NuStep   Level  4    SPM  80    Minutes  17    METs  2.9       Nutrition:  Target Goals: Understanding of nutrition guidelines, daily intake of  sodium <1576m, cholesterol <2027m calories 30% from fat and 7% or less from saturated fats, daily to have 5 or more servings of fruits and vegetables.  Biometrics:    Nutrition Therapy Plan and Nutrition Goals: Nutrition Therapy & Goals - 04/06/18 1204      Nutrition Therapy   Diet  general healthful      Personal Nutrition Goals   Nutrition Goal  Pt to identify and limit food sources of sodium    Personal Goal #2  The pt will recognize symptoms that can interfere with adequate oral intake    Personal Goal #3  The pt will have family and friends shop for food when necessary so that nourishing foods are always available at home.      Intervention Plan   Intervention  Prescribe, educate and counsel regarding individualized specific dietary modifications aiming towards targeted core components such as weight, hypertension, lipid management, diabetes, heart failure and other comorbidities.    Expected Outcomes  Short Term Goal: Understand basic principles of dietary content, such as calories, fat, sodium, cholesterol and nutrients.;Long Term Goal: Adherence to prescribed nutrition plan.       Nutrition Assessments: Nutrition Assessments - 03/29/18 1544      Rate Your Plate Scores   Pre Score  42       Nutrition Goals Re-Evaluation:   Nutrition Goals Discharge (Final Nutrition Goals Re-Evaluation):   Psychosocial: Target Goals: Acknowledge presence or absence of significant depression and/or stress, maximize coping skills, provide positive support system. Participant is able to verbalize types and ability to use techniques and skills needed for reducing stress and depression.  Initial Review & Psychosocial Screening: Initial Psych Review & Screening - 03/08/18 1541      Family Dynamics   Comments  pt lives with daughter.   Loner by choice.  Sisters live in the area has relationship but not close       Quality of Life Scores:  Scores of 19 and below usually indicate a  poorer quality of life in these areas.  A difference of  2-3 points is a clinically meaningful difference.  A difference of 2-3 points in the total score of the Quality of Life Index has been associated with significant improvement in overall quality of life, self-image, physical symptoms, and general health in studies assessing change in quality of life.  PHQ-9: Recent Review Flowsheet Data    [facility] 03/[facility]   Decreased Interest 1   Down, Depressed, Hopeless 0   PHQ - 2 Score 1   Altered sleeping 1    Tired, decreased energy 1   Change in appetite 0   Feeling bad or failure about yourself  0   Trouble concentrating 0   Moving slowly or fidgety/restless 0   Suicidal thoughts 0   PHQ-9 Score 3   Difficult doing work/chores Not difficult at all     Interpretation of Total Score  Total Score Depression Severity:  1-4 = Minimal depression, 5-9 = Mild depression, 10-14 = Moderate depression, 15-19 = Moderately severe depression, 20-27 = Severe depression   Psychosocial Evaluation and Intervention: Psychosocial Evaluation - 05/04/18 1001      Psychosocial Evaluation & Interventions   Interventions  Stress management education;Relaxation education;Encouraged to exercise with the program  and follow exercise prescription    Comments  Pt with current depression who was able to cope with holida stress d/t loss of child during this time.    Expected Outcomes  Pt will anticipate periods of time that are most difficult for her and seek out support of family and freinds.    Continue Psychosocial Services   Follow up required by staff       Psychosocial Re-Evaluation: Psychosocial Re-Evaluation    [place] Name 04/07/18 1514 05/04/18 1002           Psychosocial Re-Evaluation   Current issues with  Current Depression  Current Depression      Comments  Pt is doing well in group exercsie which is a surprise to rehab staff due to her behavior at orientation.  Pt engages and  picks with rehab staff  Pt is doing well in group exercise. and is social with staff and other participants.      Expected Outcomes  Pt will be able branch out from her loner personality and engage with freinds and families.  Pt will be able branch out from her loner personality and engage with freinds and families.      Interventions  Encouraged to attend Pulmonary Rehabilitation for the exercise;Stress management education;Relaxation education  Encouraged to attend Pulmonary Rehabilitation for the exercise;Stress management education;Relaxation education      Continue Psychosocial Services   Follow up required by staff  Follow up required by staff         Psychosocial Discharge (Final Psychosocial Re-Evaluation): Psychosocial Re-Evaluation - 05/04/18 1002      Psychosocial Re-Evaluation   Current issues with  Current Depression    Comments  Pt is doing well in group exercise. and is social with staff and other participants.    Expected Outcomes  Pt will be able branch out from her loner personality and engage with freinds and families.    Interventions  Encouraged to attend Pulmonary Rehabilitation for the exercise;Stress management education;Relaxation education    Continue Psychosocial Services   Follow up required by staff       Education: Education Goals: Education classes will be provided on a weekly basis, covering required topics. Participant will state understanding/return demonstration of topics presented.  Learning Barriers/Preferences: Learning Barriers/Preferences - 03/08/18 1447      Learning Barriers/Preferences   Learning Barriers  Sight    Learning Preferences  Group Instruction;Individual Instruction;Written Material       Education Topics: Risk Factor Reduction:  -Group instruction that is supported by a PowerPoint presentation. Instructor discusses the definition of a risk factor, different risk factors for pulmonary disease, and how the heart and lungs work  together.     Nutrition for Pulmonary Patient:  -Group instruction provided by PowerPoint slides, verbal discussion, and written materials to support subject matter. The instructor gives an explanation and review of healthy diet recommendations, which includes a discussion on weight management, recommendations for fruit and vegetable consumption, as well as protein, fluid, caffeine, fiber, sodium, sugar, and alcohol. Tips for eating when patients are short of breath are discussed.   Pursed Lip Breathing:  -Group instruction that is supported by demonstration and informational handouts. Instructor discusses the benefits of pursed lip and diaphragmatic breathing and detailed demonstration on how to preform both.     Oxygen Safety:  -Group instruction provided by PowerPoint, verbal discussion, and written material to support subject matter. There is an overview of "What is Oxygen" and "Why do we need it".  Instructor also reviews how to create a safe environment for oxygen use, the importance of using oxygen as prescribed, and the risks of noncompliance. There is a brief discussion on traveling with oxygen and resources the patient may utilize.   PULMONARY REHAB OTHER RESPIRATORY from 04/29/2018 in [place]  Date  03/18/18  Educator  Remo Lipps  Instruction Review Code  1- Verbalizes Understanding      Oxygen Equipment:  -Group instruction provided by [employer] utilizing handouts, written materials, and Insurance underwriter.   Signs and Symptoms:  -Group instruction provided by written material and verbal discussion to support subject matter. Warning signs and symptoms of infection, stroke, and heart attack are reviewed and when to call the physician/911 reinforced. Tips for preventing the spread of infection discussed.   Advanced Directives:  -Group instruction provided by verbal instruction and written material to support subject matter. Instructor  reviews Advanced Directive laws and proper instruction for filling out document.   Pulmonary Video:  -Group video education that reviews the importance of medication and oxygen compliance, exercise, good nutrition, pulmonary hygiene, and pursed lip and diaphragmatic breathing for the pulmonary patient.   Exercise for the Pulmonary Patient:  -Group instruction that is supported by a PowerPoint presentation. Instructor discusses benefits of exercise, core components of exercise, frequency, duration, and intensity of an exercise routine, importance of utilizing pulse oximetry during exercise, safety while exercising, and options of places to exercise outside of rehab.     Pulmonary Medications:  -Verbally interactive group education provided by instructor with focus on inhaled medications and proper administration.   PULMONARY REHAB OTHER RESPIRATORY from 04/29/2018 in [place]  Date  04/20/18  Educator  Pharmacist  Instruction Review Code  1- Verbalizes Understanding      Anatomy and Physiology of the Respiratory System and Intimacy:  -Group instruction provided by PowerPoint, verbal discussion, and written material to support subject matter. Instructor reviews respiratory cycle and anatomical components of the respiratory system and their functions. Instructor also reviews differences in obstructive and restrictive respiratory diseases with examples of each. Intimacy, Sex, and Sexuality differences are reviewed with a discussion on how relationships can change when diagnosed with pulmonary disease. Common sexual concerns are reviewed.   PULMONARY REHAB OTHER RESPIRATORY from 04/29/2018 in [place]  Date  04/29/18  Educator  rn  Instruction Review Code  1- Verbalizes Understanding      MD DAY -A group question and answer session with a medical doctor that allows participants to ask questions that relate to their pulmonary  disease state.   PULMONARY REHAB OTHER RESPIRATORY from 04/29/2018 in [place]  Date  04/15/18  Educator  Dr. Nelda Marseille  Instruction Review Code  1- Verbalizes Understanding      OTHER EDUCATION -Group or individual verbal, written, or video instructions that support the educational goals of the pulmonary rehab program.   Holiday Eating Survival Tips:  -Group instruction provided by PowerPoint slides, verbal discussion, and written materials to support subject matter. The instructor gives patients tips, tricks, and techniques to help them not only survive but enjoy the holidays despite the onslaught of food that accompanies the holidays.   Knowledge Questionnaire Score: Knowledge Questionnaire Score - 03/11/18 1613      Knowledge Questionnaire Score   Pre Score  15/18       Core Components/Risk Factors/Patient Goals at Admission: Personal Goals and Risk Factors at  Admission - 03/08/18 1449      Core Components/Risk Factors/Patient Goals on Admission   Improve shortness of breath with ADL's  Yes    Intervention  Provide education, individualized exercise plan and daily activity instruction to help decrease symptoms of SOB with activities of daily living.    Expected Outcomes  Short Term: Improve cardiorespiratory fitness to achieve a reduction of symptoms when performing ADLs;Long Term: Be able to perform more ADLs without symptoms or delay the onset of symptoms    Lipids  Yes    Intervention  Provide education and support for participant on nutrition & aerobic/resistive exercise along with prescribed medications to achieve LDL <16m, HDL >475m    Expected Outcomes  Short Term: Participant states understanding of desired cholesterol values and is compliant with medications prescribed. Participant is following exercise prescription and nutrition guidelines.;Long Term: Cholesterol controlled with medications as prescribed, with individualized exercise RX  and with personalized nutrition plan. Value goals: LDL < 706mHDL > 40 mg.       Core Components/Risk Factors/Patient Goals Review:  Goals and Risk Factor Review    Row Name 04/07/18 1517 05/04/18 1003           Core Components/Risk Factors/Patient Goals Review   Personal Goals Review  Develop more efficient breathing techniques such as purse lipped breathing and diaphragmatic breathing and practicing self-pacing with activity.;Increase knowledge of respiratory medications and ability to use respiratory devices properly.;Improve shortness of breath with ADL's  Develop more efficient breathing techniques such as purse lipped breathing and diaphragmatic breathing and practicing self-pacing with activity.;Increase knowledge of respiratory medications and ability to use respiratory devices properly.;Improve shortness of breath with ADL's      Review  Pt has completed 2 exercise sessions and 1 education class.  Pt able to demonstrate effective PLB techniques with verbal cues. Pt workloads nustep level 3, Track 13 laps and Recumbent bike 2.0.  Will monitor pt progress during the next 30 day review.  Pt has completed 8 exercise sessions and 4 education class.  Pt able to demonstrate effective PLB techniques with verbal cues. Pt workloads increased on nustep to level 4, Track average increased to 16-20 laps and Recumbent bike level increased to level 3.  Will monitor pt progress during the next 30 day review.      Expected Outcomes  See Admission Goals and Outcomes  See Admission Goals and Outcomes         Core Components/Risk Factors/Patient Goals at Discharge (Final Review):  Goals and Risk Factor Review - 05/04/18 1003      Core Components/Risk Factors/Patient Goals Review   Personal Goals Review  Develop more efficient breathing techniques such as purse lipped breathing and diaphragmatic breathing and practicing self-pacing with activity.;Increase knowledge of respiratory medications and ability  to use respiratory devices properly.;Improve shortness of breath with ADL's    Review  Pt has completed 8 exercise sessions and 4 education class.  Pt able to demonstrate effective PLB techniques with verbal cues. Pt workloads increased on nustep to level 4, Track average increased to 16-20 laps and Recumbent bike level increased to level 3.  Will monitor pt progress during the next 30 day review.    Expected Outcomes  See Admission Goals and Outcomes       ITP Comments: ITP Comments    Row Name 03/08/18 1415 04/07/18 1509 05/04/18 1001       ITP Comments  Dr. WesManfred Archedical Director Pulmonary Rehab  Dr. WesManfred Arch  Medical Director Pulmonary Rehab  Dr. Manfred Arch, Medical Director Pulmonary Rehab        Comments: ITP REVIEW Pt is making expected progress toward personal goals after completing 8 sessions. Recommend continued exercise, life style modification, education, and utilization of breathing techniques to increase stamina and strength and decrease shortness of breath with exertion.  Joycelyn Man RN, BSN Cardiac and Pulmonary Rehab RN

## 2018-05-04 NOTE — Progress Notes (Signed)
Subjective:   @Patient  ID@: Nancy Lambert, female    DOB: May 16, 1940, 78 y.o.   MRN: [id number]  Glendale Chard, MD:  Chief Complaint  Patient presents with  . 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 She has been started on Adempas for pulm HTN and has chronic dyspnea.  Patient is here for 3 month follow up. Since her last office visit, she has started pulmonary rehab and has had improvement in her shortness of breath and fatigue. She reports that she is feeling well. She continues to have occasional palpitations related to her stress level. Occasional chest pain that resolves with changing positions.   She does continue to have difficulty swallowing pills even with food due to dry mouth and follows [facility].   Past Medical History:  Diagnosis Date  . Anxiety   . Cardiac arrhythmia   . Glaucoma   . Hypertension   . 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[place])   . Scleroderma [facility])     Past Surgical History:  Procedure Laterality Date  . CARDIAC CATHETERIZATION N/A 07/24/2015   Procedure: Right Heart Cath;  Surgeon: Adrian Prows, MD;  Location: [facility];  Service: Cardiovascular;  Laterality: N/A;  . VESICOVAGINAL FISTULA CLOSURE W/ TAH      Family History  Problem Relation Age of Onset  . Breast cancer Mother   . Clotting disorder Mother   . Diabetes Mother   . Breast cancer Sister   . Clotting disorder Sister   . Prostate cancer Son   . Breast cancer Daughter   . Other Father        drowned  . Breast cancer Sister   . Breast cancer Other   . Colon cancer Neg Hx     Social History   Socioeconomic History  . Marital status: Widowed    Spouse name: Not on file  . Number of children: 4  . Years of education: Not on file  . Highest education level: Not on file  Occupational History  .  Occupation: Retired  Scientific laboratory technician  . Financial resource strain: Not on file  . Food insecurity:    Worry: Not on file    Inability: Not on file  . Transportation needs:    Medical: Not on file    Non-medical: Not on file  Tobacco Use  . Smoking status: Former Smoker    Types: Cigarettes    Last attempt to quit: 1971    Years since quitting: 49.1  . Smokeless tobacco: Never Used  Substance and Sexual Activity  . Alcohol use: Yes    Alcohol/week: 0.0 standard drinks    Comment: occasional  . Drug use: No  . Sexual activity: Not on file  Lifestyle  . Physical activity:    Days per week: Not on file    Minutes per session: Not on file  . Stress: Not on file  Relationships  . Social connections:    Talks on phone: Not on file    Gets together: Not on file    Attends religious service: Not on file    Active member of club or organization: Not on file    Attends meetings of clubs or organizations: Not on file    Relationship status: Not on file  . Intimate partner violence:    Fear  of current or ex partner: Not on file    Emotionally abused: Not on file    Physically abused: Not on file    Forced sexual activity: Not on file  Other Topics Concern  . Not on file  Social History Narrative  . Not on file    Current Outpatient Medications on File Prior to Visit  Medication Sig Dispense Refill  . aspirin EC 81 MG tablet Take 81 mg by mouth daily.    . diclofenac sodium (VOLTAREN) 1 % GEL Apply 2 g topically 4 (four) times daily. 1 Tube 2  . omeprazole (PRILOSEC) 40 MG capsule TAKE 1 CAPSULE BY MOUTH ONCE DAILY. TAKE 30 MINUTES BEFORE FIRST MEAL OF THE DAY 90 capsule 3  . Riociguat (ADEMPAS) 1.5 MG TABS Take 1 tablet by mouth 3 (three) times daily.     No current facility-administered medications on file prior to visit.      Review of Systems  Constitution: Negative for decreased appetite.  Cardiovascular: Positive for chest pain (occasional with certain positions),  dyspnea on exertion and palpitations. Negative for leg swelling (improved since starting therapy), orthopnea and syncope.  Respiratory: Positive for shortness of breath. Negative for wheezing.   Endocrine: Negative for cold intolerance and heat intolerance.  Hematologic/Lymphatic: Does not bruise/bleed easily.  Gastrointestinal: Positive for diarrhea (associated with certain foods) and dysphagia (chronic). Negative for nausea and vomiting.  Neurological: Negative for dizziness.       Objective:     Echo- 01/13/2018 1. Left ventricle cavity is normal in size. Moderate concentric hypertrophy of the left ventricle. Normal global wall motion. Calculated EF 65%. 2. Left atrial cavity is mildly dilated. 3. Right atrial cavity is slightly dilated. 4. Mild aortic valve leaflet calcification. Peak aortic velocity is at upper limits of normal. 5. Mild (Grade I) mitral regurgitation. 6. Moderate tricuspid regurgitation. PA pressure is normal, approx. 25 mm of Hg. 7. Mild pulmonic regurgitation. 8. Moderate pericardial effusion. No evidence of tamponade. 9. IVC is dilated with a respiratory response of >50%. 10. c.f. echo. of 07/22/2017, PA pressure is normal now, no other diagnostic change.  Lexiscan myoview stress test 06/18/2015: 1. Resting EKG demonstrates normal sinus rhythm, left axis deviation, cannot exclude inferior infarct old, Anterior infarct old.  Stress EKG was negative for myocardial ischemia.  Patient exercised for 4 minutes and 5 seconds and achieved 5.93 Mets.  There are frequent PVCs in the recovery phase of the stress test.  Stress terminated due to achieving target heart rate, 86% of MPHR.  Stress symptoms included dyspnea. 2. Myocardial perfusion imaging is normal. Overall left ventricular systolic function was normal without regional wall motion abnormalities. The left ventricular ejection fraction was 62%.  Right heart cath 07/24/2015: Right Heart Pressure:  RA A Wave   13 mmHg,   RA V Wave  9 mmHg,   RA Mean  8 mmHg  RV Systolic Pressure  48 mmHg,   RV Diastolic Pressure  4 mmHg, RV EDP  11 mmHg  PA Systolic Pressure  48 mmHg,   PA Diastolic Pressure  14 mmHg,   PA Mean  29 mmHg. PW A Wave  19 mmHg,   PW V Wave  17 mmHg,   PW Mean  15 mmHg  QP/QS  1   Total pulmonary vascular resistance 7 Wood units.  Impression: Mild to moderate pulmonary hypertension. Wedge pressure although normal, in the upper end of spectrum suggesting the pulmonary hypertension could also be related to mild LV diastolic  heart failure. Findings probably more consistent with primary pulmonary hypertension. Clinical correlation recommended.   SIX MIN WALK 05/04/2018 08/18/2016 05/28/2015  Medications - ASA 81 tab and Prilosec 20mg  at 6:30am -  Supplimental Oxygen during Test? (L/min) No No No  Laps 19 7 -  Partial Lap (in Meters) 15.24 0 -  Baseline BP (sitting) - 102/62 -  Baseline Heartrate - 65 -  Baseline Dyspnea (Borg Scale) - 1 -  Baseline Fatigue (Borg Scale) - 2 -  Baseline SPO2 96 100 -  BP (sitting) - 132/84 -  Heartrate - 90 -  Dyspnea (Borg Scale) - 1 -  Fatigue (Borg Scale) - 2 -  SPO2 96 98 -  BP (sitting) - 108/70 -  Heartrate - 59 -  SPO2 - 100 -  Stopped or Paused before Six Minutes No No -  Interpretation - Dizziness -  Distance Completed 661.24 336 -  Tech Comments: - pt walked at a normal pace (per pt)- no desat at end of test, no breaks during test///amg  normal pace/no SOB//lmr  Provider Comments: (No Data) - -     .  Physical Exam  Constitutional: She appears well-developed and well-nourished.  HENT:  Head: Normocephalic and atraumatic.  Neck: Normal range of motion. Carotid bruit is not present. No thyromegaly present.  Cardiovascular: Normal rate, regular rhythm and S1 normal. Frequent extrasystoles are present.  Murmur heard.  Systolic murmur is present with a grade of 3/6 at the upper right sternal border and lower left sternal  border. Pulses:      Femoral pulses are 2+ on the right side and 2+ on the left side.      Popliteal pulses are 2+ on the right side and 2+ on the left side.       Dorsalis pedis pulses are 0 on the right side and 1+ on the left side.       Posterior tibial pulses are 0 on the right side and 0 on the left side.  Increased S2  Pulmonary/Chest: Effort normal.  Abdominal: Soft. She exhibits no ascites. There is hepatomegaly.  Musculoskeletal: Normal range of motion.  Neurological: She is alert.  Skin: Skin is warm, dry and intact.  Abnormally dry due to scleroderma  Vitals reviewed.         Assessment & Recommendations:   Pulmonary hypertension ([place]) - Plan: 6 minute walk  Palpitations - Plan: EKG 12-Lead  Scleroderma (H[facility]), Chronic  Other dysphagia  Dyspnea on exertion  Patient is presently doing well, has had improvement in symptoms since starting pulmonary rehab. She will continue with Adempas. 6 minute walk test has remained stable. No changes noted to physical exam.   Patient continues to have occasional palpitations and was noted to have PAC's on EKG. No changes in medications. Will continue to monitor.   Dysphagia has remained stable. Continue to follow with GI. I will see her back in 3 months or sooner if problems.    *I have discussed this case with Dr. Einar Gip and he personally examined the patient and participated in formulating the plan of care.*   Nancy Lambert, [facility], [place] Office: [phone] Fax: [phone]

## 2018-05-06 ENCOUNTER — Encounter (HOSPITAL_COMMUNITY): Payer: Medicare Other

## 2018-05-11 ENCOUNTER — Encounter (HOSPITAL_COMMUNITY): Payer: Medicare Other

## 2018-05-13 ENCOUNTER — Encounter (HOSPITAL_COMMUNITY): Payer: Medicare Other

## 2018-05-18 ENCOUNTER — Encounter (HOSPITAL_COMMUNITY): Payer: Medicare Other

## 2018-05-20 ENCOUNTER — Encounter (HOSPITAL_COMMUNITY): Payer: Medicare Other

## 2018-05-24 ENCOUNTER — Telehealth (HOSPITAL_COMMUNITY): Payer: Self-pay | Admitting: *Deleted

## 2018-05-25 ENCOUNTER — Encounter (HOSPITAL_COMMUNITY)
Admission: RE | Admit: 2018-05-25 | Discharge: 2018-05-25 | Disposition: A | Discharge status: Home / Self Care | Payer: Medicare Other | Source: Ambulatory Visit | Attending: Cardiology | Admitting: Cardiology

## 2018-05-25 DIAGNOSIS — I272 Pulmonary hypertension, unspecified: Secondary | ICD-10-CM

## 2018-05-25 DIAGNOSIS — I270 Primary pulmonary hypertension: Secondary | ICD-10-CM | POA: Diagnosis not present

## 2018-05-25 NOTE — Progress Notes (Signed)
Daily Session Note  Patient Details  Name: Nancy Lambert MRN: [id number] Date of Birth: 1940/07/13 Referring Provider:     Pulmonary Rehab Walk Test from 03/11/2018 in [facility]  Referring Provider  Dr. Einar Gip      Encounter Date: 05/25/2018  Check In: Session Check In - 05/25/18 1035      Check-In   Supervising physician immediately available to respond to emergencies  [facility] immediately available    Physician(s)  Dr. Lonny Prude    Location  [facility]    Staff Present  Su Hilt, MS, ACSM RCEP, Exercise Physiologist;Dalton Kris Mouton, MS, Exercise Physiologist;Carlette Wilber Oliphant, RN, Maxcine Ham, RN, BSN    Medication changes reported      No    Fall or balance concerns reported     No    Tobacco Cessation  No Change    Warm-up and Cool-down  Performed as group-led instruction    Resistance Training Performed  Yes    VAD Patient?  No    PAD/SET Patient?  No      Pain Assessment   Currently in Pain?  No/denies    Multiple Pain Sites  No       Capillary Blood Glucose: No results found for this or any previous visit (from the past 24 hour(s)).    Social History   Tobacco Use  Smoking Status Former Smoker  . Types: Cigarettes  . Last attempt to quit: 1971  . Years since quitting: 49.1  Smokeless Tobacco Never Used    Goals Met:  Proper associated with RPD/PD & O2 Sat Exercise tolerated well Strength training completed today  Goals Unmet:  Not Applicable  Comments: Service time is from 1030 to 1200    Dr. Rush Farmer is Medical Director for Pulmonary Rehab at [facility].

## 2018-05-27 ENCOUNTER — Encounter (HOSPITAL_COMMUNITY)
Admission: RE | Admit: 2018-05-27 | Discharge: 2018-05-27 | Disposition: A | Discharge status: Home / Self Care | Payer: Medicare Other | Source: Ambulatory Visit | Attending: Cardiology | Admitting: Cardiology

## 2018-05-27 DIAGNOSIS — I270 Primary pulmonary hypertension: Secondary | ICD-10-CM | POA: Diagnosis not present

## 2018-05-27 DIAGNOSIS — I272 Pulmonary hypertension, unspecified: Secondary | ICD-10-CM

## 2018-05-27 NOTE — Progress Notes (Signed)
Daily Session Note  Patient Details  Name: Nancy Lambert MRN: [id number] Date of Birth: 1941-03-26 Referring Provider:     Pulmonary Rehab Walk Test from 03/11/2018 in [place]  Referring Provider  Dr. Einar Gip      Encounter Date: 05/27/2018  Check In: Session Check In - 05/27/18 1035      Check-In   Supervising physician immediately available to respond to emergencies  [facility] immediately available    Physician(s)  Dr. Maylene Roes    Location  [facility]    Staff Present  Hoy Register, MS, Exercise Physiologist;Blayne Garlick Ysidro Evert, RN;Molly DiVincenzo, MS, ACSM RCEP, Exercise Physiologist;Joan Leonia Reeves, RN, BSN    Medication changes reported      No    Fall or balance concerns reported     No    Tobacco Cessation  No Change    Warm-up and Cool-down  Performed as group-led Higher education careers adviser Performed  Yes    VAD Patient?  No    PAD/SET Patient?  No      Pain Assessment   Currently in Pain?  No/denies    Pain Score  0-No pain       Capillary Blood Glucose: No results found for this or any previous visit (from the past 24 hour(s)).    Social History   Tobacco Use  Smoking Status Former Smoker  . Types: Cigarettes  . Last attempt to quit: 1971  . Years since quitting: 49.1  Smokeless Tobacco Never Used    Goals Met:  Exercise tolerated well No report of cardiac concerns or symptoms Strength training completed today  Goals Unmet:  Not Applicable  Comments: Service time is from 1030 to 1200    Dr. Rush Farmer is Medical Director for Pulmonary Rehab at [facility].

## 2018-05-31 ENCOUNTER — Encounter (HOSPITAL_COMMUNITY): Payer: Self-pay

## 2018-05-31 NOTE — Progress Notes (Signed)
Pulmonary Individual Treatment Plan  Patient Details  Name: Nancy Lambert MRN: [id number] Date of Birth: [birth year] Referring Provider:     Pulmonary Rehab Walk Test from 03/11/2018 in [place]  Referring Provider  Dr. Einar Gip      Initial Encounter Date:    Pulmonary Rehab Walk Test from 03/11/2018 in [place]  Date  03/11/18      Visit Diagnosis: Pulmonary hypertension ([place])  Patient's Home Medications on Admission:   Current Outpatient Medications:  .  aspirin EC 81 MG tablet, Take 81 mg by mouth daily., Disp: , Rfl:  .  diclofenac sodium (VOLTAREN) 1 % GEL, Apply 2 g topically 4 (four) times daily., Disp: 1 Tube, Rfl: 2 .  omeprazole (PRILOSEC) 40 MG capsule, TAKE 1 CAPSULE BY MOUTH ONCE DAILY. TAKE 30 MINUTES BEFORE FIRST MEAL OF THE DAY, Disp: 90 capsule, Rfl: 3 .  Riociguat (ADEMPAS) 1.5 MG TABS, Take 1 tablet by mouth 3 (three) times daily., Disp: , Rfl:   Past Medical History: Past Medical History:  Diagnosis Date  . Anxiety   . Cardiac arrhythmia   . Glaucoma   . Hypertension   . 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 Scleroderma ([place])     Tobacco Use: Social History   Tobacco Use  Smoking Status Former Smoker  . Types: Cigarettes  . Last attempt to quit: 1971  . Years since quitting: 49.2  Smokeless Tobacco Never Used    Labs: Recent Chemical engineer    Labs for ITP Cardiac and Pulmonary Rehab Latest Ref Rng & Units 07/24/2015   HCO3 20.0 - 24.0 mEq/L 28.5(H)   TCO2 0 - 100 mmol/L 30   O2SAT % 66.0      Capillary Blood Glucose: No results found for: GLUCAP   Pulmonary Assessment Scores: Pulmonary Assessment Scores    Row Name 03/11/18 1613 03/11/18 1614       ADL UCSD   ADL Phase  Entry  -    SOB Score total  21  -      CAT Score   CAT Score  -  6       Pulmonary Function Assessment:   Exercise Target Goals: Exercise Program Goal: Individual exercise  prescription set using results from initial 6 min walk test and THRR while considering  patient's activity barriers and safety.   Exercise Prescription Goal: Initial exercise prescription builds to 30-45 minutes a day of aerobic activity, 2-3 days per week.  Home exercise guidelines will be given to patient during program as part of exercise prescription that the participant will acknowledge.  Activity Barriers & Risk Stratification: Activity Barriers & Cardiac Risk Stratification - 03/08/18 1440      Activity Barriers & Cardiac Risk Stratification   Activity Barriers  Other (comment);Balance Concerns;History of Falls   varicose vein   Cardiac Risk Stratification  High       6 Minute Walk: 6 Minute Walk    Row Name 03/11/18 1619         6 Minute Walk   Phase  Initial     Distance  1267 feet     Walk Time  6 minutes     # of Rest Breaks  0     MPH  2.4     METS  2.84     RPE  11     Perceived Dyspnea   1     Symptoms  No  Resting HR  56 bpm     Resting BP  122/64     Resting Oxygen Saturation   96 %     Exercise Oxygen Saturation  during 6 min walk  95 %     Max Ex. HR  94 bpm     Max Ex. BP  142/68     2 Minute Post BP  126/62       Interval HR   1 Minute HR  80     2 Minute HR  88     3 Minute HR  94     4 Minute HR  93     5 Minute HR  90     6 Minute HR  93     2 Minute Post HR  65     Interval Heart Rate?  Yes       Interval Oxygen   Interval Oxygen?  Yes     Baseline Oxygen Saturation %  96 %     1 Minute Oxygen Saturation %  96 %     1 Minute Liters of Oxygen  0 L     2 Minute Oxygen Saturation %  95 %     2 Minute Liters of Oxygen  0 L     3 Minute Oxygen Saturation %  95 %     3 Minute Liters of Oxygen  0 L     4 Minute Oxygen Saturation %  95 %     4 Minute Liters of Oxygen  0 L     5 Minute Oxygen Saturation %  96 %     5 Minute Liters of Oxygen  0 L     6 Minute Oxygen Saturation %  97 %     6 Minute Liters of Oxygen  0 L     2 Minute Post  Oxygen Saturation %  96 %     2 Minute Post Liters of Oxygen  0 L        Oxygen Initial Assessment: Oxygen Initial Assessment - 03/08/18 1442      Home Oxygen   Home Oxygen Device  None    Sleep Oxygen Prescription  None    Home Exercise Oxygen Prescription  None    Home at Rest Exercise Oxygen Prescription  None      Initial 6 min Walk   Oxygen Used  None      Program Oxygen Prescription   Program Oxygen Prescription  None      Intervention   Short Term Goals  To learn and exhibit compliance with exercise, home and travel O2 prescription;To learn and understand importance of maintaining oxygen saturations>88%;To learn and demonstrate proper use of respiratory medications;To learn and understand importance of monitoring SPO2 with pulse oximeter and demonstrate accurate use of the pulse oximeter.;To learn and demonstrate proper pursed lip breathing techniques or other breathing techniques.    Long  Term Goals  Verbalizes importance of monitoring SPO2 with pulse oximeter and return demonstration;Exhibits proper breathing techniques, such as pursed lip breathing or other method taught during program session;Exhibits compliance with exercise, home and travel O2 prescription;Maintenance of O2 saturations>88%       Oxygen Re-Evaluation: Oxygen Re-Evaluation    Row Name 04/06/18 0719 05/04/18 0734 06/01/18 0728         Program Oxygen Prescription   Program Oxygen Prescription  None  None  None       Home Oxygen   Home  Oxygen Device  None  None  None     Sleep Oxygen Prescription  None  None  None     Home Exercise Oxygen Prescription  None  None  None     Home at Rest Exercise Oxygen Prescription  None  None  None       Goals/Expected Outcomes   Short Term Goals  To learn and exhibit compliance with exercise, home and travel O2 prescription;To learn and understand importance of maintaining oxygen saturations>88%;To learn and demonstrate proper use of respiratory medications;To  learn and understand importance of monitoring SPO2 with pulse oximeter and demonstrate accurate use of the pulse oximeter.;To learn and demonstrate proper pursed lip breathing techniques or other breathing techniques.  To learn and exhibit compliance with exercise, home and travel O2 prescription;To learn and understand importance of maintaining oxygen saturations>88%;To learn and demonstrate proper use of respiratory medications;To learn and understand importance of monitoring SPO2 with pulse oximeter and demonstrate accurate use of the pulse oximeter.;To learn and demonstrate proper pursed lip breathing techniques or other breathing techniques.  To learn and exhibit compliance with exercise, home and travel O2 prescription;To learn and understand importance of maintaining oxygen saturations>88%;To learn and demonstrate proper use of respiratory medications;To learn and understand importance of monitoring SPO2 with pulse oximeter and demonstrate accurate use of the pulse oximeter.;To learn and demonstrate proper pursed lip breathing techniques or other breathing techniques.     Long  Term Goals  Verbalizes importance of monitoring SPO2 with pulse oximeter and return demonstration;Exhibits proper breathing techniques, such as pursed lip breathing or other method taught during program session;Exhibits compliance with exercise, home and travel O2 prescription;Maintenance of O2 saturations>88%  Verbalizes importance of monitoring SPO2 with pulse oximeter and return demonstration;Exhibits proper breathing techniques, such as pursed lip breathing or other method taught during program session;Exhibits compliance with exercise, home and travel O2 prescription;Maintenance of O2 saturations>88%  Verbalizes importance of monitoring SPO2 with pulse oximeter and return demonstration;Exhibits proper breathing techniques, such as pursed lip breathing or other method taught during program session;Exhibits compliance with  exercise, home and travel O2 prescription;Maintenance of O2 saturations>88%     Goals/Expected Outcomes  compliance   compliance   compliance         Oxygen Discharge (Final Oxygen Re-Evaluation): Oxygen Re-Evaluation - 06/01/18 0728      Program Oxygen Prescription   Program Oxygen Prescription  None      Home Oxygen   Home Oxygen Device  None    Sleep Oxygen Prescription  None    Home Exercise Oxygen Prescription  None    Home at Rest Exercise Oxygen Prescription  None      Goals/Expected Outcomes   Short Term Goals  To learn and exhibit compliance with exercise, home and travel O2 prescription;To learn and understand importance of maintaining oxygen saturations>88%;To learn and demonstrate proper use of respiratory medications;To learn and understand importance of monitoring SPO2 with pulse oximeter and demonstrate accurate use of the pulse oximeter.;To learn and demonstrate proper pursed lip breathing techniques or other breathing techniques.    Long  Term Goals  Verbalizes importance of monitoring SPO2 with pulse oximeter and return demonstration;Exhibits proper breathing techniques, such as pursed lip breathing or other method taught during program session;Exhibits compliance with exercise, home and travel O2 prescription;Maintenance of O2 saturations>88%    Goals/Expected Outcomes  compliance        Initial Exercise Prescription: Initial Exercise Prescription - 03/11/18 1600      Date of  Initial Exercise RX and Referring Provider   Date  03/11/18    Referring Provider  Dr. Einar Gip      Bike   Level  0.5    Minutes  17      NuStep   Level  3    SPM  90    Minutes  17      Track   Laps  13    Minutes  17      Prescription Details   Frequency (times per week)  2    Duration  Progress to 45 minutes of aerobic exercise without signs/symptoms of physical distress      Intensity   THRR 40-80% of Max Heartrate  58-116    Ratings of Perceived Exertion  11-13     Perceived Dyspnea  0-4      Progression   Progression  Continue to progress workloads to maintain intensity without signs/symptoms of physical distress.      Resistance Training   Training Prescription  Yes    Weight  orange bands    Reps  10-15       Perform Capillary Blood Glucose checks as needed.  Exercise Prescription Changes:  Exercise Prescription Changes    Row Name 03/18/18 1256 04/06/18 1500 04/13/18 1200 04/20/18 1200 05/04/18 1200     Response to Exercise   Blood Pressure (Admit)  120/66  124/68  -  116/56  106/60   Blood Pressure (Exercise)  140/80  154/62  -  132/74  124/60   Blood Pressure (Exit)  124/70  120/70  -  130/76  128/64   Heart Rate (Admit)  62 bpm  71 bpm  -  74 bpm  72 bpm   Heart Rate (Exercise)  102 bpm  97 bpm  -  96 bpm  96 bpm   Heart Rate (Exit)  75 bpm  78 bpm  -  72 bpm  71 bpm   Oxygen Saturation (Admit)  96 %  95 %  -  94 %  94 %   Oxygen Saturation (Exercise)  90 %  95 %  -  95 %  95 %   Oxygen Saturation (Exit)  99 %  94 %  -  95 %  97 %   Rating of Perceived Exertion (Exercise)  11  13  -  11  13   Perceived Dyspnea (Exercise)  1  1  -  0  2   Duration  Progress to 45 minutes of aerobic exercise without signs/symptoms of physical distress  Progress to 45 minutes of aerobic exercise without signs/symptoms of physical distress  -  Progress to 45 minutes of aerobic exercise without signs/symptoms of physical distress  Progress to 45 minutes of aerobic exercise without signs/symptoms of physical distress   Intensity  Other (comment) 40-80 %of HRR  THRR unchanged  -  -  THRR unchanged     Progression   Progression  Continue to progress workloads to maintain intensity without signs/symptoms of physical distress.  Continue to progress workloads to maintain intensity without signs/symptoms of physical distress.  -  Continue to progress workloads to maintain intensity without signs/symptoms of physical distress.  Continue to progress workloads to  maintain intensity without signs/symptoms of physical distress.     Resistance Training   Training Prescription  Yes  Yes  -  Yes  Yes   Weight  orange bands  orange bands  -  orange bands  orange bands  Reps  10-15  10-15  -  10-15  10-15   Time  10 Minutes  10 Minutes  -  10 Minutes  10 Minutes     Interval Training   Interval Training  -  -  -  -  No     Bike   Level  -  -  -  0.5  3   Minutes  -  -  -  17  17     NuStep   Level  3  3  -  -  4   SPM  80  80  -  -  80   Minutes  17  17  -  -  17   METs  2.1  2  -  -  2.9     Track   Laps  13  13  -  13  -   Minutes  17  17  -  17  -     Home Exercise Plan   Plans to continue exercise at  -  -  Home (comment)  -  -   Frequency  -  -  Add 3 additional days to program exercise sessions.  -  -   Initial Home Exercises Provided  -  -  04/13/18  -  -   [place] Name 06/01/18 1200             Response to Exercise   Blood Pressure (Admit)  100/62       Blood Pressure (Exercise)  130/60       Blood Pressure (Exit)  104/60       Heart Rate (Admit)  75 bpm       Heart Rate (Exercise)  108 bpm       Heart Rate (Exit)  83 bpm       Oxygen Saturation (Admit)  98 %       Oxygen Saturation (Exercise)  96 %       Oxygen Saturation (Exit)  95 %       Rating of Perceived Exertion (Exercise)  13       Perceived Dyspnea (Exercise)  2       Duration  Progress to 45 minutes of aerobic exercise without signs/symptoms of physical distress       Intensity  THRR unchanged         Progression   Progression  Continue to progress workloads to maintain intensity without signs/symptoms of physical distress.         Resistance Training   Training Prescription  Yes       Weight  orange bands       Reps  10-15       Time  10 Minutes         Bike   Level  3       Minutes  17         NuStep   Level  4       SPM  80       Minutes  17       METs  2.8         Track   Laps  14       Minutes  17          Exercise Comments:  Exercise  Comments    Row Name 04/13/18 1208           Exercise Comments  home exercise complete  Exercise Goals and Review:  Exercise Goals    Row Name 03/08/18 1434             Exercise Goals   Increase Physical Activity  Yes       Intervention  Provide advice, education, support and counseling about physical activity/exercise needs.;Develop an individualized exercise prescription for aerobic and resistive training based on initial evaluation findings, risk stratification, comorbidities and participant's personal goals.       Expected Outcomes  Short Term: Attend rehab on a regular basis to increase amount of physical activity.;Long Term: Exercising regularly at least 3-5 days a week.;Long Term: Add in home exercise to make exercise part of routine and to increase amount of physical activity.       Increase Strength and Stamina  Yes       Intervention  Provide advice, education, support and counseling about physical activity/exercise needs.;Develop an individualized exercise prescription for aerobic and resistive training based on initial evaluation findings, risk stratification, comorbidities and participant's personal goals.       Expected Outcomes  Short Term: Increase workloads from initial exercise prescription for resistance, speed, and METs.;Short Term: Perform resistance training exercises routinely during rehab and add in resistance training at home;Long Term: Improve cardiorespiratory fitness, muscular endurance and strength as measured by increased METs and functional capacity (6MWT)       Able to understand and use rate of perceived exertion (RPE) scale  Yes       Intervention  Provide education and explanation on how to use RPE scale       Expected Outcomes  Short Term: Able to use RPE daily in rehab to express subjective intensity level;Long Term:  Able to use RPE to guide intensity level when exercising independently       Able to understand and use Dyspnea scale  Yes         Intervention  Provide education and explanation on how to use Dyspnea scale       Expected Outcomes  Short Term: Able to use Dyspnea scale daily in rehab to express subjective sense of shortness of breath during exertion       Knowledge and understanding of Target Heart Rate Range (THRR)  Yes       Intervention  Provide education and explanation of THRR including how the numbers were predicted and where they are located for reference       Expected Outcomes  Short Term: Able to state/look up THRR;Short Term: Able to use daily as guideline for intensity in rehab;Long Term: Able to use THRR to govern intensity when exercising independently       Understanding of Exercise Prescription  Yes       Intervention  Provide education, explanation, and written materials on patient's individual exercise prescription       Expected Outcomes  Short Term: Able to explain program exercise prescription;Long Term: Able to explain home exercise prescription to exercise independently          Exercise Goals Re-Evaluation : Exercise Goals Re-Evaluation    Row Name 04/06/18 0719 04/06/18 1500 05/04/18 0734 06/01/18 0728       Exercise Goal Re-Evaluation   Exercise Goals Review  Increase Physical Activity;Increase Strength and Stamina;Able to understand and use rate of perceived exertion (RPE) scale;Able to understand and use Dyspnea scale;Knowledge and understanding of Target Heart Rate Range (THRR);Understanding of Exercise Prescription  Increase Physical Activity  Increase Physical Activity;Increase Strength and Stamina;Able to understand and use rate of  perceived exertion (RPE) scale;Able to understand and use Dyspnea scale;Knowledge and understanding of Target Heart Rate Range (THRR);Improve claudication pain tolerance and improve walking ability  Increase Physical Activity;Increase Strength and Stamina;Able to understand and use rate of perceived exertion (RPE) scale;Able to understand and use Dyspnea  scale;Knowledge and understanding of Target Heart Rate Range (THRR);Improve claudication pain tolerance and improve walking ability    Comments  Pt has only attended 1 exercise session and has missed the last 4. Pt states that her daughter passed away around this time last year and that this time of year is hard on her. Unsure if pt will continue with program.   -  Pt has now been coming to rehab reguarly. When pt pushes herself she can walk 20 laps (1 lap=200 ft) around the track in 15 minutes. Pt is reluctant towards workload increases but will accept them. Will continue to monitor and progress as able.   Pt missed almost a month of pulmonary rehab. Pt states that she was in a depressed state. Pt has been physicall progressing, but often lacks motivation to push herself to a somewhat hard level. Pt has increased her METs on the stepper to 2.9. Will continue to monitor and progress as able.     Expected Outcomes  Through exercise at rehab and at home, the patient will decrease shortness of breath with daily activities and feel confident in carrying out an exercise regime at home.   -  Through exercise at rehab and at home, the patient will decrease shortness of breath with daily activities and feel confident in carrying out an exercise regime at home.   Through exercise at rehab and at home, the patient will decrease shortness of breath with daily activities and feel confident in carrying out an exercise regime at home.        Discharge Exercise Prescription (Final Exercise Prescription Changes): Exercise Prescription Changes - 06/01/18 1200      Response to Exercise   Blood Pressure (Admit)  100/62    Blood Pressure (Exercise)  130/60    Blood Pressure (Exit)  104/60    Heart Rate (Admit)  75 bpm    Heart Rate (Exercise)  108 bpm    Heart Rate (Exit)  83 bpm    Oxygen Saturation (Admit)  98 %    Oxygen Saturation (Exercise)  96 %    Oxygen Saturation (Exit)  95 %    Rating of Perceived Exertion  (Exercise)  13    Perceived Dyspnea (Exercise)  2    Duration  Progress to 45 minutes of aerobic exercise without signs/symptoms of physical distress    Intensity  THRR unchanged      Progression   Progression  Continue to progress workloads to maintain intensity without signs/symptoms of physical distress.      Resistance Training   Training Prescription  Yes    Weight  orange bands    Reps  10-15    Time  10 Minutes      Bike   Level  3    Minutes  17      NuStep   Level  4    SPM  80    Minutes  17    METs  2.8      Track   Laps  14    Minutes  17       Nutrition:  Target Goals: Understanding of nutrition guidelines, daily intake of sodium <1572m, cholesterol <2050m calories 30% from  fat and 7% or less from saturated fats, daily to have 5 or more servings of fruits and vegetables.  Biometrics:    Nutrition Therapy Plan and Nutrition Goals: Nutrition Therapy & Goals - 04/06/18 1204      Nutrition Therapy   Diet  general healthful      Personal Nutrition Goals   Nutrition Goal  Pt to identify and limit food sources of sodium    Personal Goal #2  The pt will recognize symptoms that can interfere with adequate oral intake    Personal Goal #3  The pt will have family and friends shop for food when necessary so that nourishing foods are always available at home.      Intervention Plan   Intervention  Prescribe, educate and counsel regarding individualized specific dietary modifications aiming towards targeted core components such as weight, hypertension, lipid management, diabetes, heart failure and other comorbidities.    Expected Outcomes  Short Term Goal: Understand basic principles of dietary content, such as calories, fat, sodium, cholesterol and nutrients.;Long Term Goal: Adherence to prescribed nutrition plan.       Nutrition Assessments: Nutrition Assessments - 03/29/18 1544      Rate Your Plate Scores   Pre Score  42       Nutrition Goals  Re-Evaluation:   Nutrition Goals Discharge (Final Nutrition Goals Re-Evaluation):   Psychosocial: Target Goals: Acknowledge presence or absence of significant depression and/or stress, maximize coping skills, provide positive support system. Participant is able to verbalize types and ability to use techniques and skills needed for reducing stress and depression.  Initial Review & Psychosocial Screening: Initial Psych Review & Screening - 03/08/18 1541      Family Dynamics   Comments  pt lives with daughter.   Loner by choice.  Sisters live in the area has relationship but not close       Quality of Life Scores:  Scores of 19 and below usually indicate a poorer quality of life in these areas.  A difference of  2-3 points is a clinically meaningful difference.  A difference of 2-3 points in the total score of the Quality of Life Index has been associated with significant improvement in overall quality of life, self-image, physical symptoms, and general health in studies assessing change in quality of life.  PHQ-9: Recent Review Flowsheet Data    Depression screen [facility] 2/9 03/08/2018   Decreased Interest 1   Down, Depressed, Hopeless 0   PHQ - 2 Score 1   Altered sleeping 1    Tired, decreased energy 1   Change in appetite 0   Feeling bad or failure about yourself  0   Trouble concentrating 0   Moving slowly or fidgety/restless 0   Suicidal thoughts 0   PHQ-9 Score 3   Difficult doing work/chores Not difficult at all     Interpretation of Total Score  Total Score Depression Severity:  1-4 = Minimal depression, 5-9 = Mild depression, 10-14 = Moderate depression, 15-19 = Moderately severe depression, 20-27 = Severe depression   Psychosocial Evaluation and Intervention: Psychosocial Evaluation - 05/31/18 1456      Psychosocial Evaluation & Interventions   Interventions  Stress management education;Relaxation education;Encouraged to exercise with the program and follow exercise  prescription    Comments  Pt with current depression who was able to cope with holiday stress d/t loss of child during this time.    Expected Outcomes  Pt will anticipate periods of time that are most  difficult for her and seek out support of family and freinds.    Continue Psychosocial Services   Follow up required by staff       Psychosocial Re-Evaluation: Psychosocial Re-Evaluation    [place] Name 04/07/18 4970 05/04/18 1002 05/31/18 1457         Psychosocial Re-Evaluation   Current issues with  Current Depression  Current Depression  Current Depression     Comments  Pt is doing well in group exercsie which is a surprise to rehab staff due to her behavior at orientation.  Pt engages and picks with rehab staff  Pt is doing well in group exercise. and is social with staff and other participants.  pt was absent from exercise for multiple weeks d/t depression and dealing with the loss of her child that previously happened around this time. Pt states it is hard for her to get out and do anything this time of year. Pt did return to exercise and has been social with staff and other participants. Staff has also arranged a meeting with the hospital chaplain with permission from pt..     Expected Outcomes  Pt will be able branch out from her loner personality and engage with freinds and families.  Pt will be able branch out from her loner personality and engage with freinds and families.  Pt will continue to steadily participate in rehab and will benefit from resources given.     Interventions  Encouraged to attend Pulmonary Rehabilitation for the exercise;Stress management education;Relaxation education  Encouraged to attend Pulmonary Rehabilitation for the exercise;Stress management education;Relaxation education  Encouraged to attend Pulmonary Rehabilitation for the exercise;Stress management education;Relaxation education referral to chaplain     Continue Psychosocial Services   Follow up required by  staff  Follow up required by staff  Follow up required by staff        Psychosocial Discharge (Final Psychosocial Re-Evaluation): Psychosocial Re-Evaluation - 05/31/18 1457      Psychosocial Re-Evaluation   Current issues with  Current Depression    Comments  pt was absent from exercise for multiple weeks d/t depression and dealing with the loss of her child that previously happened around this time. Pt states it is hard for her to get out and do anything this time of year. Pt did return to exercise and has been social with staff and other participants. Staff has also arranged a meeting with the hospital chaplain with permission from pt..    Expected Outcomes  Pt will continue to steadily participate in rehab and will benefit from resources given.    Interventions  Encouraged to attend Pulmonary Rehabilitation for the exercise;Stress management education;Relaxation education   referral to chaplain   Continue Psychosocial Services   Follow up required by staff       Education: Education Goals: Education classes will be provided on a weekly basis, covering required topics. Participant will state understanding/return demonstration of topics presented.  Learning Barriers/Preferences: Learning Barriers/Preferences - 03/08/18 1447      Learning Barriers/Preferences   Learning Barriers  Sight    Learning Preferences  Group Instruction;Individual Instruction;Written Material       Education Topics: Risk Factor Reduction:  -Group instruction that is supported by a PowerPoint presentation. Instructor discusses the definition of a risk factor, different risk factors for pulmonary disease, and how the heart and lungs work together.     Nutrition for Pulmonary Patient:  -Group instruction provided by PowerPoint slides, verbal discussion, and written materials to support subject  matter. The instructor gives an explanation and review of healthy diet recommendations, which includes a discussion on  weight management, recommendations for fruit and vegetable consumption, as well as protein, fluid, caffeine, fiber, sodium, sugar, and alcohol. Tips for eating when patients are short of breath are discussed.   Pursed Lip Breathing:  -Group instruction that is supported by demonstration and informational handouts. Instructor discusses the benefits of pursed lip and diaphragmatic breathing and detailed demonstration on how to preform both.     Oxygen Safety:  -Group instruction provided by PowerPoint, verbal discussion, and written material to support subject matter. There is an overview of "What is Oxygen" and "Why do we need it".  Instructor also reviews how to create a safe environment for oxygen use, the importance of using oxygen as prescribed, and the risks of noncompliance. There is a brief discussion on traveling with oxygen and resources the patient may utilize.   PULMONARY REHAB OTHER RESPIRATORY from 05/27/2018 in [place]  Date  03/18/18  Educator  Remo Lipps  Instruction Review Code  1- Verbalizes Understanding      Oxygen Equipment:  -Group instruction provided by [employer] utilizing handouts, written materials, and Insurance underwriter.   Signs and Symptoms:  -Group instruction provided by written material and verbal discussion to support subject matter. Warning signs and symptoms of infection, stroke, and heart attack are reviewed and when to call the physician/911 reinforced. Tips for preventing the spread of infection discussed.   PULMONARY REHAB OTHER RESPIRATORY from 05/27/2018 in [place]  Date  05/27/18  Educator  Remo Lipps  Instruction Review Code  1- Verbalizes Understanding      Advanced Directives:  -Group instruction provided by verbal instruction and written material to support subject matter. Instructor reviews Advanced Directive laws and proper instruction for filling out  document.   Pulmonary Video:  -Group video education that reviews the importance of medication and oxygen compliance, exercise, good nutrition, pulmonary hygiene, and pursed lip and diaphragmatic breathing for the pulmonary patient.   Exercise for the Pulmonary Patient:  -Group instruction that is supported by a PowerPoint presentation. Instructor discusses benefits of exercise, core components of exercise, frequency, duration, and intensity of an exercise routine, importance of utilizing pulse oximetry during exercise, safety while exercising, and options of places to exercise outside of rehab.     Pulmonary Medications:  -Verbally interactive group education provided by instructor with focus on inhaled medications and proper administration.   PULMONARY REHAB OTHER RESPIRATORY from 05/27/2018 in [place]  Date  04/20/18  Educator  Pharmacist  Instruction Review Code  1- Verbalizes Understanding      Anatomy and Physiology of the Respiratory System and Intimacy:  -Group instruction provided by PowerPoint, verbal discussion, and written material to support subject matter. Instructor reviews respiratory cycle and anatomical components of the respiratory system and their functions. Instructor also reviews differences in obstructive and restrictive respiratory diseases with examples of each. Intimacy, Sex, and Sexuality differences are reviewed with a discussion on how relationships can change when diagnosed with pulmonary disease. Common sexual concerns are reviewed.   PULMONARY REHAB OTHER RESPIRATORY from 05/27/2018 in [place]  Date  04/29/18  Educator  rn  Instruction Review Code  1- Verbalizes Understanding      MD DAY -A group question and answer session with a medical doctor that allows participants to ask questions that relate to their pulmonary  disease state.   PULMONARY REHAB OTHER RESPIRATORY from 05/27/2018 in  [place]  Date  04/15/18  Educator  Dr. Nelda Marseille  Instruction Review Code  1- Verbalizes Understanding      OTHER EDUCATION -Group or individual verbal, written, or video instructions that support the educational goals of the pulmonary rehab program.   Holiday Eating Survival Tips:  -Group instruction provided by PowerPoint slides, verbal discussion, and written materials to support subject matter. The instructor gives patients tips, tricks, and techniques to help them not only survive but enjoy the holidays despite the onslaught of food that accompanies the holidays.   Knowledge Questionnaire Score: Knowledge Questionnaire Score - 03/11/18 1613      Knowledge Questionnaire Score   Pre Score  15/18       Core Components/Risk Factors/Patient Goals at Admission: Personal Goals and Risk Factors at Admission - 03/08/18 1449      Core Components/Risk Factors/Patient Goals on Admission   Improve shortness of breath with ADL's  Yes    Intervention  Provide education, individualized exercise plan and daily activity instruction to help decrease symptoms of SOB with activities of daily living.    Expected Outcomes  Short Term: Improve cardiorespiratory fitness to achieve a reduction of symptoms when performing ADLs;Long Term: Be able to perform more ADLs without symptoms or delay the onset of symptoms    Lipids  Yes    Intervention  Provide education and support for participant on nutrition & aerobic/resistive exercise along with prescribed medications to achieve LDL <46m, HDL >429m    Expected Outcomes  Short Term: Participant states understanding of desired cholesterol values and is compliant with medications prescribed. Participant is following exercise prescription and nutrition guidelines.;Long Term: Cholesterol controlled with medications as prescribed, with individualized exercise RX and with personalized nutrition plan. Value goals: LDL < 7019mHDL > 40  mg.       Core Components/Risk Factors/Patient Goals Review:  Goals and Risk Factor Review    Row Name 04/07/18 1517 05/04/18 1003 05/31/18 1501         Core Components/Risk Factors/Patient Goals Review   Personal Goals Review  Develop more efficient breathing techniques such as purse lipped breathing and diaphragmatic breathing and practicing self-pacing with activity.;Increase knowledge of respiratory medications and ability to use respiratory devices properly.;Improve shortness of breath with ADL's  Develop more efficient breathing techniques such as purse lipped breathing and diaphragmatic breathing and practicing self-pacing with activity.;Increase knowledge of respiratory medications and ability to use respiratory devices properly.;Improve shortness of breath with ADL's  Develop more efficient breathing techniques such as purse lipped breathing and diaphragmatic breathing and practicing self-pacing with activity.;Increase knowledge of respiratory medications and ability to use respiratory devices properly.;Improve shortness of breath with ADL's     Review  Pt has completed 2 exercise sessions and 1 education class.  Pt able to demonstrate effective PLB techniques with verbal cues. Pt workloads nustep level 3, Track 13 laps and Recumbent bike 2.0.  Will monitor pt progress during the next 30 day review.  Pt has completed 8 exercise sessions and 4 education class.  Pt able to demonstrate effective PLB techniques with verbal cues. Pt workloads increased on nustep to level 4, Track average increased to 16-20 laps and Recumbent bike level increased to level 3.  Will monitor pt progress during the next 30 day review.  Pt has completed 10 exercise sessions and 5 education classes. Pt was absent from rehab for several weeks d/t  contiuing battle with depression and time of year her child passed away. Pt able to demonstrate effective PLB techniques with verbal cues. Pt workloads maintained on nustep at level  4, Track average increased to 16-20 laps and Recumbent bike level maintained at level 3.  Will monitor pt progress during the next 30 day review.     Expected Outcomes  See Admission Goals and Outcomes  See Admission Goals and Outcomes  See Admission Goals and Outcomes        Core Components/Risk Factors/Patient Goals at Discharge (Final Review):  Goals and Risk Factor Review - 05/31/18 1501      Core Components/Risk Factors/Patient Goals Review   Personal Goals Review  Develop more efficient breathing techniques such as purse lipped breathing and diaphragmatic breathing and practicing self-pacing with activity.;Increase knowledge of respiratory medications and ability to use respiratory devices properly.;Improve shortness of breath with ADL's    Review  Pt has completed 10 exercise sessions and 5 education classes. Pt was absent from rehab for several weeks d/t contiuing battle with depression and time of year her child passed away. Pt able to demonstrate effective PLB techniques with verbal cues. Pt workloads maintained on nustep at level 4, Track average increased to 16-20 laps and Recumbent bike level maintained at level 3.  Will monitor pt progress during the next 30 day review.    Expected Outcomes  See Admission Goals and Outcomes       ITP Comments: ITP Comments    Row Name 03/08/18 1415 04/07/18 1509 05/04/18 1001 05/31/18 1454     ITP Comments  Dr. Manfred Arch, Medical Director Pulmonary Rehab  Dr. Manfred Arch, Medical Director Pulmonary Rehab  Dr. Manfred Arch, Medical Director Pulmonary Rehab  Dr. Manfred Arch, Medical Director Pulmonary Rehab       Comments: ITP REVIEW Pt is making expected progress toward personal goals after completing 10 sessions. Recommend continued exercise, life style modification, education, and utilization of breathing techniques to increase stamina and strength and decrease shortness of breath with exertion.  Joycelyn Man RN, BSN Cardiac and  Pulmonary Rehab RN

## 2018-06-01 ENCOUNTER — Encounter (HOSPITAL_COMMUNITY)
Admission: RE | Admit: 2018-06-01 | Discharge: 2018-06-01 | Disposition: A | Discharge status: Home / Self Care | Payer: Medicare Other | Source: Ambulatory Visit | Attending: Cardiology | Admitting: Cardiology

## 2018-06-01 VITALS — Wt 146.2 lb

## 2018-06-01 DIAGNOSIS — I272 Pulmonary hypertension, unspecified: Secondary | ICD-10-CM

## 2018-06-01 DIAGNOSIS — I270 Primary pulmonary hypertension: Secondary | ICD-10-CM | POA: Diagnosis not present

## 2018-06-01 NOTE — Progress Notes (Signed)
Daily Session Note  Patient Details  Name: Nancy Lambert MRN: [id number] Date of Birth: [birth year] Referring Provider:     Pulmonary Rehab Walk Test from 03/11/2018 in [place]  Referring Provider  Dr. Einar Gip      Encounter Date: 06/01/2018  Check In: Session Check In - 06/01/18 1037      Check-In   Physician(s)  Dr. Maylene Roes    Location  [facility]    Staff Present  Joycelyn Man RN, BSN;Joan Leonia Reeves, RN, BSN;Molly DiVincenzo, MS, ACSM RCEP, Exercise Physiologist;Dalton Kris Mouton, MS, Exercise Physiologist;Lisa Ysidro Evert, RN    Medication changes reported      No    Fall or balance concerns reported     No    Tobacco Cessation  No Change    Warm-up and Cool-down  Performed as group-led instruction    Resistance Training Performed  Yes    VAD Patient?  No    PAD/SET Patient?  No      Pain Assessment   Currently in Pain?  No/denies       Capillary Blood Glucose: No results found for this or any previous visit (from the past 24 hour(s)).  Exercise Prescription Changes - 06/01/18 1200      Response to Exercise   Blood Pressure (Admit)  100/62    Blood Pressure (Exercise)  130/60    Blood Pressure (Exit)  104/60    Heart Rate (Admit)  75 bpm    Heart Rate (Exercise)  108 bpm    Heart Rate (Exit)  83 bpm    Oxygen Saturation (Admit)  98 %    Oxygen Saturation (Exercise)  96 %    Oxygen Saturation (Exit)  95 %    Rating of Perceived Exertion (Exercise)  13    Perceived Dyspnea (Exercise)  2    Duration  Progress to 45 minutes of aerobic exercise without signs/symptoms of physical distress    Intensity  THRR unchanged      Progression   Progression  Continue to progress workloads to maintain intensity without signs/symptoms of physical distress.      Resistance Training   Training Prescription  Yes    Weight  orange bands    Reps  10-15    Time  10 Minutes      Bike   Level  3    Minutes  17      NuStep   Level  4     SPM  80    Minutes  17    METs  2.8      Track   Laps  14    Minutes  17       Social History   Tobacco Use  Smoking Status Former Smoker  . Types: Cigarettes  . Last attempt to quit: 1971  . Years since quitting: 49.2  Smokeless Tobacco Never Used    Goals Met:  Proper associated with RPD/PD & O2 Sat Exercise tolerated well Strength training completed today  Goals Unmet:  Not Applicable  Comments: Service time is from 1030 to 1205   Dr. Rush Farmer is Medical Director for Pulmonary Rehab at [facility].

## 2018-06-02 ENCOUNTER — Ambulatory Visit: Payer: Medicare Other | Admitting: Internal Medicine

## 2018-06-03 ENCOUNTER — Encounter (HOSPITAL_COMMUNITY)
Admission: RE | Admit: 2018-06-03 | Discharge: 2018-06-03 | Disposition: A | Discharge status: Home / Self Care | Payer: Medicare Other | Source: Ambulatory Visit | Attending: Cardiology | Admitting: Cardiology

## 2018-06-03 DIAGNOSIS — I270 Primary pulmonary hypertension: Secondary | ICD-10-CM | POA: Diagnosis not present

## 2018-06-03 DIAGNOSIS — I272 Pulmonary hypertension, unspecified: Secondary | ICD-10-CM

## 2018-06-03 NOTE — Progress Notes (Signed)
Daily Session Note  Patient Details  Name: Nancy Lambert MRN: [id number] Date of Birth: Oct 09, 1940 Referring Provider:     Pulmonary Rehab Walk Test from 03/11/2018 in [place]  Referring Provider  Dr. Einar Gip      Encounter Date: 06/03/2018  Check In: Session Check In - 06/03/18 1030      Check-In   Supervising physician immediately available to respond to emergencies  [facility] immediately available    Physician(s)  Dr. Wynelle Cleveland    Location  [facility]    Staff Present  Joycelyn Man RN, BSN;Molly DiVincenzo, MS, ACSM RCEP, Exercise Physiologist;Dalton Kris Mouton, MS, Exercise Physiologist;Joan Leonia Reeves, RN, Roque Cash, RN    Medication changes reported      No    Fall or balance concerns reported     No    Tobacco Cessation  No Change    Warm-up and Cool-down  Performed as group-led instruction    Resistance Training Performed  Yes    VAD Patient?  No    PAD/SET Patient?  No      Pain Assessment   Currently in Pain?  No/denies    Multiple Pain Sites  No       Capillary Blood Glucose: No results found for this or any previous visit (from the past 24 hour(s)).    Social History   Tobacco Use  Smoking Status Former Smoker  . Types: Cigarettes  . Last attempt to quit: 1971  . Years since quitting: 49.2  Smokeless Tobacco Never Used    Goals Met:  Proper associated with RPD/PD & O2 Sat Exercise tolerated well Strength training completed today  Goals Unmet:  Not Applicable  Comments: Service time is from 1030 to 1200   Dr. Rush Farmer is Medical Director for Pulmonary Rehab at [facility].

## 2018-06-08 ENCOUNTER — Encounter (HOSPITAL_COMMUNITY)
Admission: RE | Admit: 2018-06-08 | Discharge: 2018-06-08 | Disposition: A | Discharge status: Home / Self Care | Payer: Medicare Other | Source: Ambulatory Visit | Attending: Cardiology | Admitting: Cardiology

## 2018-06-08 DIAGNOSIS — I270 Primary pulmonary hypertension: Secondary | ICD-10-CM | POA: Diagnosis not present

## 2018-06-08 DIAGNOSIS — I272 Pulmonary hypertension, unspecified: Secondary | ICD-10-CM

## 2018-06-08 NOTE — Progress Notes (Signed)
Daily Session Note  Patient Details  Name: Nancy Lambert MRN: [id number] Date of Birth: 07-01-1940 Referring Provider:     Pulmonary Rehab Walk Test from 03/11/2018 in [place]  Referring Provider  Dr. Einar Gip      Encounter Date: 06/08/2018  Check In: Session Check In - 06/08/18 1128      Check-In   Supervising physician immediately available to respond to emergencies  [facility] immediately available    Physician(s)  Dr. Jonnie Finner    Location  [facility]    Staff Present  Su Hilt, MS, ACSM RCEP, Exercise Physiologist;Dalton Kris Mouton, MS, Exercise Physiologist;Joan Leonia Reeves, RN, Roque Cash, RN;Other    Medication changes reported      No    Fall or balance concerns reported     No    Tobacco Cessation  No Change    Warm-up and Cool-down  Performed as group-led instruction    Resistance Training Performed  Yes    VAD Patient?  No    PAD/SET Patient?  No      Pain Assessment   Currently in Pain?  No/denies    Multiple Pain Sites  No       Capillary Blood Glucose: No results found for this or any previous visit (from the past 24 hour(s)).    Social History   Tobacco Use  Smoking Status Former Smoker  . Types: Cigarettes  . Last attempt to quit: 1971  . Years since quitting: 49.2  Smokeless Tobacco Never Used    Goals Met:  Exercise tolerated well No report of cardiac concerns or symptoms Strength training completed today  Goals Unmet:  Not Applicable  Comments: Service time is from 1030 to 1215    Dr. Rush Farmer is Medical Director for Pulmonary Rehab at [facility].

## 2018-06-10 ENCOUNTER — Encounter (HOSPITAL_COMMUNITY)
Admission: RE | Admit: 2018-06-10 | Discharge: 2018-06-10 | Disposition: A | Discharge status: Home / Self Care | Payer: Medicare Other | Source: Ambulatory Visit | Attending: Cardiology | Admitting: Cardiology

## 2018-06-10 ENCOUNTER — Other Ambulatory Visit: Payer: Self-pay

## 2018-06-10 VITALS — Wt 151.9 lb

## 2018-06-10 DIAGNOSIS — I272 Pulmonary hypertension, unspecified: Secondary | ICD-10-CM

## 2018-06-10 DIAGNOSIS — I270 Primary pulmonary hypertension: Secondary | ICD-10-CM | POA: Diagnosis not present

## 2018-06-10 NOTE — Progress Notes (Signed)
Daily Session Note  Patient Details  Name: Nancy Lambert MRN: [id number] Date of Birth: [birth year] Referring Provider:     Pulmonary Rehab Walk Test from 03/11/2018 in [place]  Referring Provider  Dr. Einar Gip      Encounter Date: 06/10/2018  Check In: Session Check In - 06/10/18 1026      Check-In   Supervising physician immediately available to respond to emergencies  [facility] immediately available    Physician(s)  Dr. Cathlean Sauer    Location  [facility]    Staff Present  Joycelyn Man RN, BSN;Lisa Ysidro Evert, RN;Dalton Kris Mouton, MS, Exercise Physiologist;Donshay Lupinski, MS, ACSM RCEP, Exercise Physiologist    Medication changes reported      No    Fall or balance concerns reported     No    Tobacco Cessation  No Change    Warm-up and Cool-down  Performed as group-led instruction    Resistance Training Performed  Yes    VAD Patient?  No    PAD/SET Patient?  No      Pain Assessment   Currently in Pain?  No/denies    Pain Score  0-No pain    Multiple Pain Sites  No       Capillary Blood Glucose: No results found for this or any previous visit (from the past 24 hour(s)).    Social History   Tobacco Use  Smoking Status Former Smoker  . Types: Cigarettes  . Last attempt to quit: 1971  . Years since quitting: 49.2  Smokeless Tobacco Never Used    Goals Met:  Exercise tolerated well  Goals Unmet:  Not Applicable  Comments: Service time is from 10:30a to 12:05p    Dr. Rush Farmer is Medical Director for Pulmonary Rehab at [facility].

## 2018-06-14 ENCOUNTER — Telehealth (HOSPITAL_COMMUNITY): Payer: Self-pay

## 2018-06-14 NOTE — Telephone Encounter (Signed)
Pt called informing pt of Cardiac and Pulmonary rehab closure for the next 2 weeks.   May Manrique RN, BSN Cardiac and Pulmonary Rehab RN  

## 2018-06-15 ENCOUNTER — Encounter (HOSPITAL_COMMUNITY): Payer: Medicare Other

## 2018-06-17 ENCOUNTER — Encounter (HOSPITAL_COMMUNITY): Payer: Medicare Other

## 2018-06-21 ENCOUNTER — Encounter (HOSPITAL_COMMUNITY): Payer: Self-pay

## 2018-06-21 NOTE — Progress Notes (Signed)
Pulmonary Individual Treatment Plan  Patient Details  Name: Nancy Lambert MRN: [id number] Date of Birth: 10/09/1940 Referring Provider:     Pulmonary Rehab Walk Test from 03/11/2018 in [place]  Referring Provider  Dr. Einar Gip      Initial Encounter Date:    Pulmonary Rehab Walk Test from 03/11/2018 in [place]  Date  03/11/18      Visit Diagnosis: Pulmonary hypertension ([place])  Patient's Home Medications on Admission:   Current Outpatient Medications:    aspirin EC 81 MG tablet, Take 81 mg by mouth daily., Disp: , Rfl:    diclofenac sodium (VOLTAREN) 1 % GEL, Apply 2 g topically 4 (four) times daily., Disp: 1 Tube, Rfl: 2   omeprazole (PRILOSEC) 40 MG capsule, TAKE 1 CAPSULE BY MOUTH ONCE DAILY. TAKE 30 MINUTES BEFORE FIRST MEAL OF THE DAY, Disp: 90 capsule, Rfl: 3   Riociguat (ADEMPAS) 1.5 MG TABS, Take 1 tablet by mouth 3 (three) times daily., Disp: , Rfl:   Past Medical History: Past Medical History:  Diagnosis Date   Anxiety    Cardiac arrhythmia    Glaucoma    Hypertension    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[place])    Scleroderma ([place])     Tobacco Use: Social History   Tobacco Use  Smoking Status Former Smoker   Types: Cigarettes   Last attempt to quit: 1971   Years since quitting: 49.2  Smokeless Tobacco Never Used    Labs: Recent [employer] for ITP Cardiac and Pulmonary Rehab Latest Ref Rng & Units 07/24/2015   HCO3 20.0 - 24.0 mEq/L 28.5(H)   TCO2 0 - 100 mmol/L 30   O2SAT % 66.0      Capillary Blood Glucose: No results found for: GLUCAP   Pulmonary Assessment Scores: Pulmonary Assessment Scores    Row Name 03/11/18 1613 03/11/18 1614       ADL [facility]SD   ADL Phase  Entry  --    SOB Score total  21  --      CAT Score   CAT Score  --  6       Pulmonary Function Assessment:   Exercise Target Goals: Exercise Program Goal: Individual  exercise prescription set using results from initial 6 min walk test and THRR while considering  patients activity barriers and safety.   Exercise Prescription Goal: Initial exercise prescription builds to 30-45 minutes a day of aerobic activity, 2-3 days per week.  Home exercise guidelines will be given to patient during program as part of exercise prescription that the participant will acknowledge.  Activity Barriers & Risk Stratification: Activity Barriers & Cardiac Risk Stratification - 03/08/18 1440      Activity Barriers & Cardiac Risk Stratification   Activity Barriers  Other (comment);Balance Concerns;History of Falls   varicose vein   Cardiac Risk Stratification  High       6 Minute Walk: 6 Minute Walk    Row Name 03/11/18 1619         6 Minute Walk   Phase  Initial     Distance  1267 feet     Walk Time  6 minutes     # of Rest Breaks  0     MPH  2.4     METS  2.84     RPE  11     Perceived Dyspnea   1     Symptoms  No  Resting HR  56 bpm     Resting BP  122/64     Resting Oxygen Saturation   96 %     Exercise Oxygen Saturation  during 6 min walk  95 %     Max Ex. HR  94 bpm     Max Ex. BP  142/68     2 Minute Post BP  126/62       Interval HR   1 Minute HR  80     2 Minute HR  88     3 Minute HR  94     4 Minute HR  93     5 Minute HR  90     6 Minute HR  93     2 Minute Post HR  65     Interval Heart Rate?  Yes       Interval Oxygen   Interval Oxygen?  Yes     Baseline Oxygen Saturation %  96 %     1 Minute Oxygen Saturation %  96 %     1 Minute Liters of Oxygen  0 L     2 Minute Oxygen Saturation %  95 %     2 Minute Liters of Oxygen  0 L     3 Minute Oxygen Saturation %  95 %     3 Minute Liters of Oxygen  0 L     4 Minute Oxygen Saturation %  95 %     4 Minute Liters of Oxygen  0 L     5 Minute Oxygen Saturation %  96 %     5 Minute Liters of Oxygen  0 L     6 Minute Oxygen Saturation %  97 %     6 Minute Liters of Oxygen  0 L     2  Minute Post Oxygen Saturation %  96 %     2 Minute Post Liters of Oxygen  0 L        Oxygen Initial Assessment: Oxygen Initial Assessment - 03/08/18 1442      Home Oxygen   Home Oxygen Device  None    Sleep Oxygen Prescription  None    Home Exercise Oxygen Prescription  None    Home at Rest Exercise Oxygen Prescription  None      Initial 6 min Walk   Oxygen Used  None      Program Oxygen Prescription   Program Oxygen Prescription  None      Intervention   Short Term Goals  To learn and exhibit compliance with exercise, home and travel O2 prescription;To learn and understand importance of maintaining oxygen saturations>88%;To learn and demonstrate proper use of respiratory medications;To learn and understand importance of monitoring SPO2 with pulse oximeter and demonstrate accurate use of the pulse oximeter.;To learn and demonstrate proper pursed lip breathing techniques or other breathing techniques.    Long  Term Goals  Verbalizes importance of monitoring SPO2 with pulse oximeter and return demonstration;Exhibits proper breathing techniques, such as pursed lip breathing or other method taught during program session;Exhibits compliance with exercise, home and travel O2 prescription;Maintenance of O2 saturations>88%       Oxygen Re-Evaluation: Oxygen Re-Evaluation    Row Name 04/06/18 0719 05/04/18 0734 06/01/18 0728 06/21/18 0926       Program Oxygen Prescription   Program Oxygen Prescription  None  None  None  None      Home Oxygen  Home Oxygen Device  None  None  None  None    Sleep Oxygen Prescription  None  None  None  None    Home Exercise Oxygen Prescription  None  None  None  None    Home at Rest Exercise Oxygen Prescription  None  None  None  None      Goals/Expected Outcomes   Short Term Goals  To learn and exhibit compliance with exercise, home and travel O2 prescription;To learn and understand importance of maintaining oxygen saturations>88%;To learn and  demonstrate proper use of respiratory medications;To learn and understand importance of monitoring SPO2 with pulse oximeter and demonstrate accurate use of the pulse oximeter.;To learn and demonstrate proper pursed lip breathing techniques or other breathing techniques.  To learn and exhibit compliance with exercise, home and travel O2 prescription;To learn and understand importance of maintaining oxygen saturations>88%;To learn and demonstrate proper use of respiratory medications;To learn and understand importance of monitoring SPO2 with pulse oximeter and demonstrate accurate use of the pulse oximeter.;To learn and demonstrate proper pursed lip breathing techniques or other breathing techniques.  To learn and exhibit compliance with exercise, home and travel O2 prescription;To learn and understand importance of maintaining oxygen saturations>88%;To learn and demonstrate proper use of respiratory medications;To learn and understand importance of monitoring SPO2 with pulse oximeter and demonstrate accurate use of the pulse oximeter.;To learn and demonstrate proper pursed lip breathing techniques or other breathing techniques.  To learn and exhibit compliance with exercise, home and travel O2 prescription;To learn and understand importance of maintaining oxygen saturations>88%;To learn and demonstrate proper use of respiratory medications;To learn and understand importance of monitoring SPO2 with pulse oximeter and demonstrate accurate use of the pulse oximeter.;To learn and demonstrate proper pursed lip breathing techniques or other breathing techniques.    Long  Term Goals  Verbalizes importance of monitoring SPO2 with pulse oximeter and return demonstration;Exhibits proper breathing techniques, such as pursed lip breathing or other method taught during program session;Exhibits compliance with exercise, home and travel O2 prescription;Maintenance of O2 saturations>88%  Verbalizes importance of monitoring  SPO2 with pulse oximeter and return demonstration;Exhibits proper breathing techniques, such as pursed lip breathing or other method taught during program session;Exhibits compliance with exercise, home and travel O2 prescription;Maintenance of O2 saturations>88%  Verbalizes importance of monitoring SPO2 with pulse oximeter and return demonstration;Exhibits proper breathing techniques, such as pursed lip breathing or other method taught during program session;Exhibits compliance with exercise, home and travel O2 prescription;Maintenance of O2 saturations>88%  Verbalizes importance of monitoring SPO2 with pulse oximeter and return demonstration;Exhibits proper breathing techniques, such as pursed lip breathing or other method taught during program session;Exhibits compliance with exercise, home and travel O2 prescription;Maintenance of O2 saturations>88%    Goals/Expected Outcomes  compliance   compliance   compliance   compliance        Oxygen Discharge (Final Oxygen Re-Evaluation): Oxygen Re-Evaluation - 06/21/18 0926      Program Oxygen Prescription   Program Oxygen Prescription  None      Home Oxygen   Home Oxygen Device  None    Sleep Oxygen Prescription  None    Home Exercise Oxygen Prescription  None    Home at Rest Exercise Oxygen Prescription  None      Goals/Expected Outcomes   Short Term Goals  To learn and exhibit compliance with exercise, home and travel O2 prescription;To learn and understand importance of maintaining oxygen saturations>88%;To learn and demonstrate proper use of respiratory medications;To learn and understand  importance of monitoring SPO2 with pulse oximeter and demonstrate accurate use of the pulse oximeter.;To learn and demonstrate proper pursed lip breathing techniques or other breathing techniques.    Long  Term Goals  Verbalizes importance of monitoring SPO2 with pulse oximeter and return demonstration;Exhibits proper breathing techniques, such as pursed lip  breathing or other method taught during program session;Exhibits compliance with exercise, home and travel O2 prescription;Maintenance of O2 saturations>88%    Goals/Expected Outcomes  compliance        Initial Exercise Prescription: Initial Exercise Prescription - 03/11/18 1600      Date of Initial Exercise RX and Referring Provider   Date  03/11/18    Referring Provider  Dr. Einar Gip      Bike   Level  0.5    Minutes  17      NuStep   Level  3    SPM  90    Minutes  17      Track   Laps  13    Minutes  17      Prescription Details   Frequency (times per week)  2    Duration  Progress to 45 minutes of aerobic exercise without signs/symptoms of physical distress      Intensity   THRR 40-80% of Max Heartrate  58-116    Ratings of Perceived Exertion  11-13    Perceived Dyspnea  0-4      Progression   Progression  Continue to progress workloads to maintain intensity without signs/symptoms of physical distress.      Resistance Training   Training Prescription  Yes    Weight  orange bands    Reps  10-15       Perform Capillary Blood Glucose checks as needed.  Exercise Prescription Changes: Exercise Prescription Changes    Row Name 03/18/18 1256 04/06/18 1500 04/13/18 1200 04/20/18 1200 05/04/18 1200     Response to Exercise   Blood Pressure (Admit)  120/66  124/68  --  116/56  106/60   Blood Pressure (Exercise)  140/80  154/62  --  132/74  124/60   Blood Pressure (Exit)  124/70  120/70  --  130/76  128/64   Heart Rate (Admit)  62 bpm  71 bpm  --  74 bpm  72 bpm   Heart Rate (Exercise)  102 bpm  97 bpm  --  96 bpm  96 bpm   Heart Rate (Exit)  75 bpm  78 bpm  --  72 bpm  71 bpm   Oxygen Saturation (Admit)  96 %  95 %  --  94 %  94 %   Oxygen Saturation (Exercise)  90 %  95 %  --  95 %  95 %   Oxygen Saturation (Exit)  99 %  94 %  --  95 %  97 %   Rating of Perceived Exertion (Exercise)  11  13  --  11  13   Perceived Dyspnea (Exercise)  1  1  --  0  2   Duration   Progress to 45 minutes of aerobic exercise without signs/symptoms of physical distress  Progress to 45 minutes of aerobic exercise without signs/symptoms of physical distress  --  Progress to 45 minutes of aerobic exercise without signs/symptoms of physical distress  Progress to 45 minutes of aerobic exercise without signs/symptoms of physical distress   Intensity  Other (comment) 40-80 %of HRR  THRR unchanged  --  --  THRR unchanged  Progression   Progression  Continue to progress workloads to maintain intensity without signs/symptoms of physical distress.  Continue to progress workloads to maintain intensity without signs/symptoms of physical distress.  --  Continue to progress workloads to maintain intensity without signs/symptoms of physical distress.  Continue to progress workloads to maintain intensity without signs/symptoms of physical distress.     Resistance Training   Training Prescription  Yes  Yes  --  Yes  Yes   Weight  orange bands  orange bands  --  orange bands  orange bands   Reps  10-15  10-15  --  10-15  10-15   Time  10 Minutes  10 Minutes  --  10 Minutes  10 Minutes     Interval Training   Interval Training  --  --  --  --  No     Bike   Level  --  --  --  0.5  3   Minutes  --  --  --  17  17     NuStep   Level  3  3  --  --  4   SPM  80  80  --  --  80   Minutes  17  17  --  --  17   METs  2.1  2  --  --  2.9     Track   Laps  13  13  --  13  --   Minutes  17  17  --  17  --     Home Exercise Plan   Plans to continue exercise at  --  --  Home (comment)  --  --   Frequency  --  --  Add 3 additional days to program exercise sessions.  --  --   Initial Home Exercises Provided  --  --  04/13/18  --  --   [place] Name 06/01/18 1200 06/17/18 1500           Response to Exercise   Blood Pressure (Admit)  100/62  130/68      Blood Pressure (Exercise)  130/60  138/60      Blood Pressure (Exit)  104/60  116/64      Heart Rate (Admit)  75 bpm  64 bpm      Heart  Rate (Exercise)  108 bpm  92 bpm      Heart Rate (Exit)  83 bpm  68 bpm      Oxygen Saturation (Admit)  98 %  95 %      Oxygen Saturation (Exercise)  96 %  94 %      Oxygen Saturation (Exit)  95 %  97 %      Rating of Perceived Exertion (Exercise)  13  14      Perceived Dyspnea (Exercise)  2  3      Duration  Progress to 45 minutes of aerobic exercise without signs/symptoms of physical distress  Progress to 45 minutes of aerobic exercise without signs/symptoms of physical distress      Intensity  THRR unchanged  THRR unchanged        Progression   Progression  Continue to progress workloads to maintain intensity without signs/symptoms of physical distress.  Continue to progress workloads to maintain intensity without signs/symptoms of physical distress.        Resistance Training   Training Prescription  Yes  Yes      Weight  orange bands  orange bands  Reps  10-15  10-15      Time  10 Minutes  10 Minutes        Interval Training   Interval Training  --  No        Bike   Level  3  3      Minutes  17  17        NuStep   Level  4  --      SPM  80  --      Minutes  17  --      METs  2.8  --        Track   Laps  14  16      Minutes  17  17         Exercise Comments: Exercise Comments    Row Name 04/13/18 1208           Exercise Comments  home exercise complete          Exercise Goals and Review: Exercise Goals    [place] Name 03/08/18 1434             Exercise Goals   Increase Physical Activity  Yes       Intervention  Provide advice, education, support and counseling about physical activity/exercise needs.;Develop an individualized exercise prescription for aerobic and resistive training based on initial evaluation findings, risk stratification, comorbidities and participant's personal goals.       Expected Outcomes  Short Term: Attend rehab on a regular basis to increase amount of physical activity.;Long Term: Exercising regularly at least 3-5 days a  week.;Long Term: Add in home exercise to make exercise part of routine and to increase amount of physical activity.       Increase Strength and Stamina  Yes       Intervention  Provide advice, education, support and counseling about physical activity/exercise needs.;Develop an individualized exercise prescription for aerobic and resistive training based on initial evaluation findings, risk stratification, comorbidities and participant's personal goals.       Expected Outcomes  Short Term: Increase workloads from initial exercise prescription for resistance, speed, and METs.;Short Term: Perform resistance training exercises routinely during rehab and add in resistance training at home;Long Term: Improve cardiorespiratory fitness, muscular endurance and strength as measured by increased METs and functional capacity (6MWT)       Able to understand and use rate of perceived exertion (RPE) scale  Yes       Intervention  Provide education and explanation on how to use RPE scale       Expected Outcomes  Short Term: Able to use RPE daily in rehab to express subjective intensity level;Long Term:  Able to use RPE to guide intensity level when exercising independently       Able to understand and use Dyspnea scale  Yes       Intervention  Provide education and explanation on how to use Dyspnea scale       Expected Outcomes  Short Term: Able to use Dyspnea scale daily in rehab to express subjective sense of shortness of breath during exertion       Knowledge and understanding of Target Heart Rate Range (THRR)  Yes       Intervention  Provide education and explanation of THRR including how the numbers were predicted and where they are located for reference       Expected Outcomes  Short Term: Able to state/look up THRR;Short Term: Able to  use daily as guideline for intensity in rehab;Long Term: Able to use THRR to govern intensity when exercising independently       Understanding of Exercise Prescription  Yes        Intervention  Provide education, explanation, and written materials on patient's individual exercise prescription       Expected Outcomes  Short Term: Able to explain program exercise prescription;Long Term: Able to explain home exercise prescription to exercise independently          Exercise Goals Re-Evaluation : Exercise Goals Re-Evaluation    Row Name 04/06/18 0719 04/06/18 1500 05/04/18 0734 06/01/18 0728 06/21/18 0927     Exercise Goal Re-Evaluation   Exercise Goals Review  Increase Physical Activity;Increase Strength and Stamina;Able to understand and use rate of perceived exertion (RPE) scale;Able to understand and use Dyspnea scale;Knowledge and understanding of Target Heart Rate Range (THRR);Understanding of Exercise Prescription  Increase Physical Activity  Increase Physical Activity;Increase Strength and Stamina;Able to understand and use rate of perceived exertion (RPE) scale;Able to understand and use Dyspnea scale;Knowledge and understanding of Target Heart Rate Range (THRR);Improve claudication pain tolerance and improve walking ability  Increase Physical Activity;Increase Strength and Stamina;Able to understand and use rate of perceived exertion (RPE) scale;Able to understand and use Dyspnea scale;Knowledge and understanding of Target Heart Rate Range (THRR);Improve claudication pain tolerance and improve walking ability  Increase Physical Activity;Increase Strength and Stamina;Able to understand and use rate of perceived exertion (RPE) scale;Able to understand and use Dyspnea scale;Knowledge and understanding of Target Heart Rate Range (THRR);Improve claudication pain tolerance and improve walking ability   Comments  Pt has only attended 1 exercise session and has missed the last 4. Pt states that her daughter passed away around this time last year and that this time of year is hard on her. Unsure if pt will continue with program.   --  Pt has now been coming to rehab reguarly. When pt  pushes herself she can walk 20 laps (1 lap=200 ft) around the track in 15 minutes. Pt is reluctant towards workload increases but will accept them. Will continue to monitor and progress as able.   Pt missed almost a month of pulmonary rehab. Pt states that she was in a depressed state. Pt has been physicall progressing, but often lacks motivation to push herself to a somewhat hard level. Pt has increased her METs on the stepper to 2.9. Will continue to monitor and progress as able.   Pt continued to exercise around 2.8 METs. Pt had been more cheeful and motivated upon returning from her layoff. Department is currently closed for likely the next few weeks. Pt has previously been instructed on home exercise. Will follow up again with patient this week with more updates as I recieve them.   Expected Outcomes  Through exercise at rehab and at home, the patient will decrease shortness of breath with daily activities and feel confident in carrying out an exercise regime at home.   --  Through exercise at rehab and at home, the patient will decrease shortness of breath with daily activities and feel confident in carrying out an exercise regime at home.   Through exercise at rehab and at home, the patient will decrease shortness of breath with daily activities and feel confident in carrying out an exercise regime at home.   Through exercise at rehab and at home, the patient will decrease shortness of breath with daily activities and feel confident in carrying out an exercise regime at  home.       Discharge Exercise Prescription (Final Exercise Prescription Changes): Exercise Prescription Changes - 06/17/18 1500      Response to Exercise   Blood Pressure (Admit)  130/68    Blood Pressure (Exercise)  138/60    Blood Pressure (Exit)  116/64    Heart Rate (Admit)  64 bpm    Heart Rate (Exercise)  92 bpm    Heart Rate (Exit)  68 bpm    Oxygen Saturation (Admit)  95 %    Oxygen Saturation (Exercise)  94 %     Oxygen Saturation (Exit)  97 %    Rating of Perceived Exertion (Exercise)  14    Perceived Dyspnea (Exercise)  3    Duration  Progress to 45 minutes of aerobic exercise without signs/symptoms of physical distress    Intensity  THRR unchanged      Progression   Progression  Continue to progress workloads to maintain intensity without signs/symptoms of physical distress.      Resistance Training   Training Prescription  Yes    Weight  orange bands    Reps  10-15    Time  10 Minutes      Interval Training   Interval Training  No      Bike   Level  3    Minutes  17      Track   Laps  16    Minutes  17       Nutrition:  Target Goals: Understanding of nutrition guidelines, daily intake of sodium <1568m, cholesterol <2024m calories 30% from fat and 7% or less from saturated fats, daily to have 5 or more servings of fruits and vegetables.  Biometrics:    Nutrition Therapy Plan and Nutrition Goals: Nutrition Therapy & Goals - 04/06/18 1204      Nutrition Therapy   Diet  general healthful      Personal Nutrition Goals   Nutrition Goal  Pt to identify and limit food sources of sodium    Personal Goal #2  The pt will recognize symptoms that can interfere with adequate oral intake    Personal Goal #3  The pt will have family and friends shop for food when necessary so that nourishing foods are always available at home.      Intervention Plan   Intervention  Prescribe, educate and counsel regarding individualized specific dietary modifications aiming towards targeted core components such as weight, hypertension, lipid management, diabetes, heart failure and other comorbidities.    Expected Outcomes  Short Term Goal: Understand basic principles of dietary content, such as calories, fat, sodium, cholesterol and nutrients.;Long Term Goal: Adherence to prescribed nutrition plan.       Nutrition Assessments: Nutrition Assessments - 03/29/18 1544      Rate Your Plate Scores    Pre Score  42       Nutrition Goals Re-Evaluation:   Nutrition Goals Discharge (Final Nutrition Goals Re-Evaluation):   Psychosocial: Target Goals: Acknowledge presence or absence of significant depression and/or stress, maximize coping skills, provide positive support system. Participant is able to verbalize types and ability to use techniques and skills needed for reducing stress and depression.  Initial Review & Psychosocial Screening: Initial Psych Review & Screening - 03/08/18 1541      Family Dynamics   Comments  pt lives with daughter.   Loner by choice.  Sisters live in the area has relationship but not close       Quality of  Life Scores:  Scores of 19 and below usually indicate a poorer quality of life in these areas.  A difference of  2-3 points is a clinically meaningful difference.  A difference of 2-3 points in the total score of the Quality of Life Index has been associated with significant improvement in overall quality of life, self-image, physical symptoms, and general health in studies assessing change in quality of life.  PHQ-9: Recent Review Flowsheet Data    Depression screen [place] [facility] 2/[facility] 03/08/2018   Decreased Interest 1   Down, Depressed, Hopeless 0   PHQ - 2 Score 1   Altered sleeping 1    Tired, decreased energy 1   Change in appetite 0   Feeling bad or failure about yourself  0   Trouble concentrating 0   Moving slowly or fidgety/restless 0   Suicidal thoughts 0   PHQ-9 Score 3   Difficult doing work/chores Not difficult at all     Interpretation of Total Score  Total Score Depression Severity:  1-4 = Minimal depression, 5-9 = Mild depression, 10-14 = Moderate depression, 15-19 = Moderately severe depression, 20-27 = Severe depression   Psychosocial Evaluation and Intervention: Psychosocial Evaluation - 05/31/18 1456      Psychosocial Evaluation & Interventions   Interventions  Stress management education;Relaxation education;Encouraged to  exercise with the program and follow exercise prescription    Comments  Pt with current depression who was able to cope with holiday stress d/t loss of child during this time.    Expected Outcomes  Pt will anticipate periods of time that are most difficult for her and seek out support of family and freinds.    Continue Psychosocial Services   Follow up required by staff       Psychosocial Re-Evaluation: Psychosocial Re-Evaluation    [place] Name 04/07/18 7124 05/04/18 1002 05/31/18 1457 06/21/18 1139       Psychosocial Re-Evaluation   Current issues with  Current Depression  Current Depression  Current Depression  Current Depression    Comments  Pt is doing well in group exercsie which is a surprise to rehab staff due to her behavior at orientation.  Pt engages and picks with rehab staff  Pt is doing well in group exercise. and is social with staff and other participants.  pt was absent from exercise for multiple weeks d/t depression and dealing with the loss of her child that previously happened around this time. Pt states it is hard for her to get out and do anything this time of year. Pt did return to exercise and has been social with staff and other participants. Staff has also arranged a meeting with the hospital chaplain with permission from pt..  pt attendance has been more consistent and pt continues to be social with staff and other participants. Pt also met with hospital chaplain which seemed to be beneficial for the pt. Rehab is currently closed and will plan to re-evaulate pt progress upon reopening.    Expected Outcomes  Pt will be able branch out from her loner personality and engage with freinds and families.  Pt will be able branch out from her loner personality and engage with freinds and families.  Pt will continue to steadily participate in rehab and will benefit from resources given.  Pt will continue to steadily participate in rehab and will benefit from resources given.     Interventions  Encouraged to attend Pulmonary Rehabilitation for the exercise;Stress management education;Relaxation education  Encouraged to attend  Pulmonary Rehabilitation for the exercise;Stress management education;Relaxation education  Encouraged to attend Pulmonary Rehabilitation for the exercise;Stress management education;Relaxation education referral to chaplain  --    Continue Psychosocial Services   Follow up required by staff  Follow up required by staff  Follow up required by staff  Follow up required by staff       Psychosocial Discharge (Final Psychosocial Re-Evaluation): Psychosocial Re-Evaluation - 06/21/18 1139      Psychosocial Re-Evaluation   Current issues with  Current Depression    Comments  pt attendance has been more consistent and pt continues to be social with staff and other participants. Pt also met with hospital chaplain which seemed to be beneficial for the pt. Rehab is currently closed and will plan to re-evaulate pt progress upon reopening.    Expected Outcomes  Pt will continue to steadily participate in rehab and will benefit from resources given.    Continue Psychosocial Services   Follow up required by staff       Education: Education Goals: Education classes will be provided on a weekly basis, covering required topics. Participant will state understanding/return demonstration of topics presented.  Learning Barriers/Preferences: Learning Barriers/Preferences - 03/08/18 1447      Learning Barriers/Preferences   Learning Barriers  Sight    Learning Preferences  Group Instruction;Individual Instruction;Written Material       Education Topics: Risk Factor Reduction:  -Group instruction that is supported by a PowerPoint presentation. Instructor discusses the definition of a risk factor, different risk factors for pulmonary disease, and how the heart and lungs work together.     Nutrition for Pulmonary Patient:  -Group instruction provided by  PowerPoint slides, verbal discussion, and written materials to support subject matter. The instructor gives an explanation and review of healthy diet recommendations, which includes a discussion on weight management, recommendations for fruit and vegetable consumption, as well as protein, fluid, caffeine, fiber, sodium, sugar, and alcohol. Tips for eating when patients are short of breath are discussed.   Pursed Lip Breathing:  -Group instruction that is supported by demonstration and informational handouts. Instructor discusses the benefits of pursed lip and diaphragmatic breathing and detailed demonstration on how to preform both.     Oxygen Safety:  -Group instruction provided by PowerPoint, verbal discussion, and written material to support subject matter. There is an overview of What is Oxygen and Why do we need it.  Instructor also reviews how to create a safe environment for oxygen use, the importance of using oxygen as prescribed, and the risks of noncompliance. There is a brief discussion on traveling with oxygen and resources the patient may utilize.   PULMONARY REHAB OTHER RESPIRATORY from 06/10/2018 in [place]  Date  03/18/18  Educator  Remo Lipps  Instruction Review Code  1- Verbalizes Understanding      Oxygen Equipment:  -Group instruction provided by [employer] utilizing handouts, written materials, and Insurance underwriter.   Signs and Symptoms:  -Group instruction provided by written material and verbal discussion to support subject matter. Warning signs and symptoms of infection, stroke, and heart attack are reviewed and when to call the physician/911 reinforced. Tips for preventing the spread of infection discussed.   PULMONARY REHAB OTHER RESPIRATORY from 06/10/2018 in [place]  Date  05/27/18  Educator  Remo Lipps  Instruction Review Code  1- Verbalizes Understanding      Advanced Directives:   -Group instruction provided by verbal instruction and written material  to support subject matter. Instructor reviews Advanced Directive laws and proper instruction for filling out document.   Pulmonary Video:  -Group video education that reviews the importance of medication and oxygen compliance, exercise, good nutrition, pulmonary hygiene, and pursed lip and diaphragmatic breathing for the pulmonary patient.   Exercise for the Pulmonary Patient:  -Group instruction that is supported by a PowerPoint presentation. Instructor discusses benefits of exercise, core components of exercise, frequency, duration, and intensity of an exercise routine, importance of utilizing pulse oximetry during exercise, safety while exercising, and options of places to exercise outside of rehab.     Pulmonary Medications:  -Verbally interactive group education provided by instructor with focus on inhaled medications and proper administration.   PULMONARY REHAB OTHER RESPIRATORY from 06/10/2018 in [place]  Date  04/20/18  Educator  Pharmacist  Instruction Review Code  1- Verbalizes Understanding      Anatomy and Physiology of the Respiratory System and Intimacy:  -Group instruction provided by PowerPoint, verbal discussion, and written material to support subject matter. Instructor reviews respiratory cycle and anatomical components of the respiratory system and their functions. Instructor also reviews differences in obstructive and restrictive respiratory diseases with examples of each. Intimacy, Sex, and Sexuality differences are reviewed with a discussion on how relationships can change when diagnosed with pulmonary disease. Common sexual concerns are reviewed.   PULMONARY REHAB OTHER RESPIRATORY from 06/10/2018 in [place]  Date  06/10/18  Educator  rn  Instruction Review Code  1- Verbalizes Understanding      MD DAY -A group question and  answer session with a medical doctor that allows participants to ask questions that relate to their pulmonary disease state.   PULMONARY REHAB OTHER RESPIRATORY from 06/10/2018 in [place]  Date  04/15/18  Educator  Dr. Nelda Marseille  Instruction Review Code  1- Verbalizes Understanding      OTHER EDUCATION -Group or individual verbal, written, or video instructions that support the educational goals of the pulmonary rehab program.   Holiday Eating Survival Tips:  -Group instruction provided by PowerPoint slides, verbal discussion, and written materials to support subject matter. The instructor gives patients tips, tricks, and techniques to help them not only survive but enjoy the holidays despite the onslaught of food that accompanies the holidays.   Knowledge Questionnaire Score: Knowledge Questionnaire Score - 03/11/18 1613      Knowledge Questionnaire Score   Pre Score  15/18       Core Components/Risk Factors/Patient Goals at Admission: Personal Goals and Risk Factors at Admission - 03/08/18 1449      Core Components/Risk Factors/Patient Goals on Admission   Improve shortness of breath with ADL's  Yes    Intervention  Provide education, individualized exercise plan and daily activity instruction to help decrease symptoms of SOB with activities of daily living.    Expected Outcomes  Short Term: Improve cardiorespiratory fitness to achieve a reduction of symptoms when performing ADLs;Long Term: Be able to perform more ADLs without symptoms or delay the onset of symptoms    Lipids  Yes    Intervention  Provide education and support for participant on nutrition & aerobic/resistive exercise along with prescribed medications to achieve LDL <86m, HDL >448m    Expected Outcomes  Short Term: Participant states understanding of desired cholesterol values and is compliant with medications prescribed. Participant is following exercise prescription and nutrition  guidelines.;Long Term: Cholesterol controlled with medications as  prescribed, with individualized exercise RX and with personalized nutrition plan. Value goals: LDL < 80m, HDL > 40 mg.       Core Components/Risk Factors/Patient Goals Review:  Goals and Risk Factor Review    Row Name 04/07/18 1517 05/04/18 1003 05/31/18 1501 06/21/18 1142       Core Components/Risk Factors/Patient Goals Review   Personal Goals Review  Develop more efficient breathing techniques such as purse lipped breathing and diaphragmatic breathing and practicing self-pacing with activity.;Increase knowledge of respiratory medications and ability to use respiratory devices properly.;Improve shortness of breath with ADL's  Develop more efficient breathing techniques such as purse lipped breathing and diaphragmatic breathing and practicing self-pacing with activity.;Increase knowledge of respiratory medications and ability to use respiratory devices properly.;Improve shortness of breath with ADL's  Develop more efficient breathing techniques such as purse lipped breathing and diaphragmatic breathing and practicing self-pacing with activity.;Increase knowledge of respiratory medications and ability to use respiratory devices properly.;Improve shortness of breath with ADL's  Develop more efficient breathing techniques such as purse lipped breathing and diaphragmatic breathing and practicing self-pacing with activity.;Increase knowledge of respiratory medications and ability to use respiratory devices properly.;Improve shortness of breath with ADL's    Review  Pt has completed 2 exercise sessions and 1 education class.  Pt able to demonstrate effective PLB techniques with verbal cues. Pt workloads nustep level 3, Track 13 laps and Recumbent bike 2.0.  Will monitor pt progress during the next 30 day review.  Pt has completed 8 exercise sessions and 4 education class.  Pt able to demonstrate effective PLB techniques with verbal cues. Pt  workloads increased on nustep to level 4, Track average increased to 16-20 laps and Recumbent bike level increased to level 3.  Will monitor pt progress during the next 30 day review.  Pt has completed 10 exercise sessions and 5 education classes. Pt was absent from rehab for several weeks d/t contiuing battle with depression and time of year her child passed away. Pt able to demonstrate effective PLB techniques with verbal cues. Pt workloads maintained on nustep at level 4, Track average increased to 16-20 laps and Recumbent bike level maintained at level 3.  Will monitor pt progress during the next 30 day review.  Pt has completed 14 exercise sessions and 7 education classes. Pt attendance has become more steady. Pt able to demonstrate effective PLB techniques with verbal cues. Pt workloads increased on nustep at level 5, Track average increased to 16-20 laps and Recumbent bike level maintained at level 3.  Rehab is currently closed and will plan to re-evaulate pt progress upon reopening.    Expected Outcomes  See Admission Goals and Outcomes  See Admission Goals and Outcomes  See Admission Goals and Outcomes  See Admission Goals and Outcomes       Core Components/Risk Factors/Patient Goals at Discharge (Final Review):  Goals and Risk Factor Review - 06/21/18 1142      Core Components/Risk Factors/Patient Goals Review   Personal Goals Review  Develop more efficient breathing techniques such as purse lipped breathing and diaphragmatic breathing and practicing self-pacing with activity.;Increase knowledge of respiratory medications and ability to use respiratory devices properly.;Improve shortness of breath with ADL's    Review  Pt has completed 14 exercise sessions and 7 education classes. Pt attendance has become more steady. Pt able to demonstrate effective PLB techniques with verbal cues. Pt workloads increased on nustep at level 5, Track average increased to 16-20 laps and Recumbent bike level  maintained at level 3.  Rehab is currently closed and will plan to re-evaulate pt progress upon reopening.    Expected Outcomes  See Admission Goals and Outcomes       ITP Comments: ITP Comments    Row Name 03/08/18 1415 04/07/18 1509 05/04/18 1001 05/31/18 1454 06/21/18 1139   ITP Comments  Dr. Manfred Arch, Medical Director Pulmonary Rehab  Dr. Manfred Arch, Medical Director Pulmonary Rehab  Dr. Manfred Arch, Medical Director Pulmonary Rehab  Dr. Manfred Arch, Medical Director Pulmonary Rehab  Dr. Manfred Arch, Medical Director Pulmonary Rehab      Comments: ITP REVIEW Pt is making expected progress toward personal goals after completing 14 sessions. Rehab is currently closed and will plan to re-evaulate pt progress upon reopening.  Joycelyn Man RN, BSN Cardiac and Pulmonary Rehab RN

## 2018-06-22 ENCOUNTER — Encounter (HOSPITAL_COMMUNITY): Payer: Medicare Other

## 2018-06-22 ENCOUNTER — Telehealth (HOSPITAL_COMMUNITY): Payer: Self-pay

## 2018-07-13 ENCOUNTER — Telehealth (HOSPITAL_COMMUNITY): Payer: Self-pay | Admitting: *Deleted

## 2018-08-03 ENCOUNTER — Ambulatory Visit (HOSPITAL_COMMUNITY): Payer: Medicare Other

## 2018-08-05 ENCOUNTER — Ambulatory Visit (HOSPITAL_COMMUNITY): Payer: Medicare Other

## 2018-08-10 ENCOUNTER — Ambulatory Visit (HOSPITAL_COMMUNITY): Payer: Medicare Other

## 2018-08-11 ENCOUNTER — Ambulatory Visit (INDEPENDENT_AMBULATORY_CARE_PROVIDER_SITE_OTHER): Payer: Medicare Other | Admitting: Cardiology

## 2018-08-11 ENCOUNTER — Telehealth (HOSPITAL_COMMUNITY): Payer: Self-pay | Admitting: *Deleted

## 2018-08-11 ENCOUNTER — Other Ambulatory Visit: Payer: Self-pay

## 2018-08-11 ENCOUNTER — Encounter: Payer: Self-pay | Admitting: Cardiology

## 2018-08-11 VITALS — BP 128/65 | HR 76 | Ht 64.0 in | Wt 142.6 lb

## 2018-08-11 DIAGNOSIS — I270 Primary pulmonary hypertension: Secondary | ICD-10-CM

## 2018-08-11 DIAGNOSIS — R1319 Other dysphagia: Secondary | ICD-10-CM

## 2018-08-11 DIAGNOSIS — M349 Systemic sclerosis, unspecified: Secondary | ICD-10-CM | POA: Diagnosis not present

## 2018-08-11 DIAGNOSIS — I272 Pulmonary hypertension, unspecified: Secondary | ICD-10-CM | POA: Diagnosis not present

## 2018-08-11 DIAGNOSIS — R002 Palpitations: Secondary | ICD-10-CM

## 2018-08-11 NOTE — Progress Notes (Signed)
I called to check on Nancy Lambert since the closure of pulmonary rehab due to COVID-19 precautions.  She is doing fine, but would like to be discharged since we are closed indefinitely.  She completed 14 exercise sessions.  She lost her warm-up exercise handout and requested another which I mailed to her along with an addressed envelope to return her parking badge to [place].

## 2018-08-11 NOTE — Addendum Note (Signed)
Encounter addended by: Lance Morin, RN on: 08/11/2018 2:57 PM  Actions taken: Clinical Note Signed

## 2018-08-11 NOTE — Progress Notes (Signed)
Primary Physician/Referring:  Nancy Chard, MD  Patient ID: Nancy Lambert, female    DOB: 1940/09/13, 78 y.o.   MRN: [id number]  Chief Complaint  Patient presents with  . Hypertension  . Follow-up    HPI: Nancy Lambert  is a 78 y.o. 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 She has been started on Adempas for pulm HTN and has chronic dyspnea.  Patient is here for 3 month follow up. Since she has started pulmonary rehab and has had improvement in her shortness of breath and fatigue, however this is been on hold due to COVID 19. She reports that she is feeling well. She continues to have occasional palpitations related to her stress level. Occasional chest pain that resolves with changing positions.   She does continue to have difficulty swallowing pills even with food due to dry mouth and follows [facility].    Past Medical History:  Diagnosis Date  . Anxiety   . Cardiac arrhythmia   . Glaucoma   . Hypertension   . 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[place])   . Scleroderma [facility])     Past Surgical History:  Procedure Laterality Date  . CARDIAC CATHETERIZATION N/A 07/24/2015   Procedure: Right Heart Cath;  Surgeon: Adrian Prows, MD;  Location: [facility];  Service: Cardiovascular;  Laterality: N/A;  . VESICOVAGINAL FISTULA CLOSURE W/ TAH      Social History   Socioeconomic History  . Marital status: Widowed    Spouse name: Not on file  . Number of children: 4  . Years of education: Not on file  . Highest education level: Not on file  Occupational History  . Occupation: Retired  Scientific laboratory technician  . Financial resource strain: Not on file  . Food insecurity:    Worry: Not on file    Inability: Not on file  . Transportation needs:    Medical: Not on file    Non-medical: Not on file  Tobacco Use  . Smoking status: Former Smoker    Types: Cigarettes     Last attempt to quit: 1971    Years since quitting: 49.3  . Smokeless tobacco: Never Used  Substance and Sexual Activity  . Alcohol use: Yes    Alcohol/week: 0.0 standard drinks    Comment: occasional  . Drug use: No  . Sexual activity: Not on file  Lifestyle  . Physical activity:    Days per week: Not on file    Minutes per session: Not on file  . Stress: Not on file  Relationships  . Social connections:    Talks on phone: Not on file    Gets together: Not on file    Attends religious service: Not on file    Active member of club or organization: Not on file    Attends meetings of clubs or organizations: Not on file    Relationship status: Not on file  . Intimate partner violence:    Fear of current or ex partner: Not on file    Emotionally abused: Not on file    Physically abused: Not on file    Forced sexual activity: Not on file  Other Topics Concern  . Not on file  Social History Narrative  . Not on file    Review of Systems  Constitution: Negative for decreased appetite.  Cardiovascular: Positive for chest pain (occasional with certain GERD), dyspnea on exertion (  stable) and palpitations. Negative for leg swelling (improved since starting therapy), orthopnea and syncope.  Respiratory: Positive for shortness of breath. Negative for wheezing.   Endocrine: Negative for cold intolerance and heat intolerance.  Hematologic/Lymphatic: Does not bruise/bleed easily.  Gastrointestinal: Positive for dysphagia (chronic) and heartburn. Negative for diarrhea, nausea and vomiting.  Neurological: Negative for dizziness.  All other systems reviewed and are negative.     Objective  Blood pressure 128/65, pulse 76, height 5\' 4"  (1.626 m), weight 142 lb 9.6 oz (64.7 kg), SpO2 96 %. Body mass index is 24.48 kg/m.    Physical Exam  Constitutional: She appears well-developed and well-nourished.  HENT:  Head: Normocephalic and atraumatic.  Neck: Normal range of motion. Carotid  bruit is not present. No thyromegaly present.  Cardiovascular: Normal rate, regular rhythm and S1 normal. Frequent extrasystoles are present.  Murmur heard.  Systolic murmur is present with a grade of 3/6 at the upper right sternal border and lower left sternal border. Pulses:      Femoral pulses are 2+ on the right side and 2+ on the left side.      Popliteal pulses are 2+ on the right side and 2+ on the left side.       Dorsalis pedis pulses are 0 on the right side and 1+ on the left side.       Posterior tibial pulses are 0 on the right side and 0 on the left side.  Increased S2  Pulmonary/Chest: Effort normal.  Abdominal: Soft. She exhibits no ascites. There is hepatomegaly.  Musculoskeletal: Normal range of motion.  Neurological: She is alert.  Skin: Skin is warm, dry and intact.  Abnormally dry due to scleroderma  Vitals reviewed.  Radiology: No results found.  Laboratory examination:    CMP Latest Ref Rng & Units 02/10/2018 12/30/2008 11/25/2008  Glucose 70 - 99 mg/dL 92 153(H) 82  BUN 8 - 23 mg/dL 10 11 10  DELTA CHECK NOTED  Creatinine 0.44 - 1.00 mg/dL 0.85 0.63 0.87  Sodium 135 - 145 mmol/L 142 140 138  Potassium 3.5 - 5.1 mmol/L 3.4(L) 3.1(L) 3.5  Chloride 98 - 111 mmol/L 108 105 108  CO2 22 - 32 mmol/L 26 24 24   Calcium 8.9 - 10.3 mg/dL 9.5 9.4 8.6  Total Protein 6.0 - 8.3 g/dL - 7.1 5.7(L)  Total Bilirubin 0.3 - 1.2 mg/dL - 1.0 0.6  Alkaline Phos 39 - 117 U/L - 54 37(L)  AST 0 - 37 U/L - 28 18  ALT 0 - 35 U/L - 11 12   CBC Latest Ref Rng & Units 02/10/2018 12/30/2008 11/25/2008  WBC 4.0 - 10.5 K/uL 4.1 13.0(H) 3.8(L)  Hemoglobin 12.0 - 15.0 g/dL 12.4 12.8 10.6 DELTA CHECK NOTED(L)  Hematocrit 36.0 - 46.0 % 40.7 38.9 32.1(L)  Platelets 150 - 400 K/uL 291 360 260   Lipid Panel  No results found for: CHOL, TRIG, HDL, CHOLHDL, VLDL, LDLCALC, LDLDIRECT HEMOGLOBIN A1C No results found for: HGBA1C, MPG TSH No results for input(s): TSH in the last 8760 hours.  PRN  Meds:. There are no discontinued medications. Current Meds  Medication Sig  . aspirin EC 81 MG tablet Take 81 mg by mouth daily.  Marland Kitchen omeprazole (PRILOSEC) 40 MG capsule TAKE 1 CAPSULE BY MOUTH ONCE DAILY. TAKE 30 MINUTES BEFORE FIRST MEAL OF THE DAY  . Riociguat (ADEMPAS) 2 MG TABS Take 1 tablet by mouth 3 (three) times daily.     Cardiac Studies:    Echo-  01/13/2018 1. Left ventricle cavity is normal in size. Moderate concentric hypertrophy of the left ventricle. Normal global wall motion. Calculated EF 65%. 2. Left atrial cavity is mildly dilated. 3. Right atrial cavity is slightly dilated. 4. Mild aortic valve leaflet calcification. Peak aortic velocity is at upper limits of normal. 5. Mild (Grade I) mitral regurgitation. 6. Moderate tricuspid regurgitation. PA pressure is normal, approx. 25 mm of Hg. 7. Mild pulmonic regurgitation. 8. Moderate pericardial effusion. No evidence of tamponade. 9. IVC is dilated with a respiratory response of >50%. 10. c.f. echo. of 07/22/2017, PA pressure is normal now, no other diagnostic change.  Lexiscan myoview stress test 06/18/2015: 1. Resting EKG demonstrates normal sinus rhythm, left axis deviation, cannot exclude inferior infarct old, Anterior infarct old.  Stress EKG was negative for myocardial ischemia.  Patient exercised for 4 minutes and 5 seconds and achieved 5.93 Mets.  There are frequent PVCs in the recovery phase of the stress test.  Stress terminated due to achieving target heart rate, 86% of MPHR.  Stress symptoms included dyspnea. 2. Myocardial perfusion imaging is normal. Overall left ventricular systolic function was normal without regional wall motion abnormalities. The left ventricular ejection fraction was 62%.  Right heart cath 07/24/2015: Right Heart Pressure:  RA A Wave  13 mmHg,   RA V Wave  9 mmHg,   RA Mean  8 mmHg  RV Systolic Pressure  48 mmHg,   RV Diastolic Pressure  4 mmHg, RV EDP  11 mmHg  PA Systolic Pressure  48  mmHg,   PA Diastolic Pressure  14 mmHg,   PA Mean  29 mmHg. PW A Wave  19 mmHg,   PW V Wave  17 mmHg,   PW Mean  15 mmHg  QP/QS  1    Total pulmonary vascular resistance 7 Wood units.   Impression: Mild to moderate pulmonary hypertension. Wedge pressure although normal, in the upper end of spectrum suggesting the pulmonary hypertension could also be related to mild LV diastolic heart failure. Findings probably more consistent with primary pulmonary hypertension. Clinical correlation recommended.   _____________________________________________________________________________________________________________________  SIX MIN WALK 08/11/2018 05/04/2018 08/18/2016 05/28/2015  Medications - - ASA 81 tab and Prilosec 20mg  at 6:30am -  Supplimental Oxygen during Test? (L/min) No No No No  Laps 253.59 19 7 -  Partial Lap (in Meters) - 15.24 0 -  Baseline BP (sitting) - - 102/62 -  Baseline Heartrate 78 - 65 -  Baseline Dyspnea (Borg Scale) - - 1 -  Baseline Fatigue (Borg Scale) - - 2 -  Baseline SPO2 99 96 100 -  BP (sitting) - - 132/84 -  Heartrate 101 - 90 -  Dyspnea (Borg Scale) - - 1 -  Fatigue (Borg Scale) - - 2 -  SPO2 94 96 98 -  BP (sitting) - - 108/70 -  Heartrate 89 - 59 -  SPO2 99 - 100 -  Stopped or Paused before Six Minutes No No No -  Interpretation - - Dizziness -  Distance Completed - 661.24 336 -  Tech Comments: (No Data) - pt walked at a normal pace (per pt)- no desat at end of test, no breaks during test///amg  normal pace/no SOB//lmr  Provider Comments: 253.59 meters (No Data) - -     Assessment   Pulmonary hypertension ([place]) - Plan: PCV ECHOCARDIOGRAM COMPLETE, 6 minute walk  Scleroderma ([facility])  Other dysphagia - Plan: PCV ECHOCARDIOGRAM COMPLETE  Palpitations  EKG 05/04/2018: Sinus bradycardia at the  rate of 58 bpm with 1 PAC, left axis deviation, left anterior fasicular block. 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 Low voltage complexes. Abnormal EKG. No changes from EKG  01/25/2018  Recommendations:   atient is presently doing well, has had improvement in symptoms since starting pulmonary rehab however the rehab is now closed due to [place] 19. She will continue with Adempas. 6 minute walk testreviewed. No changes noted to physical exam. In view of less than 300 mm walking distance, I would like to repeat echocardiogram and monitor her closely and have a very low threshold to start her on 2nd agent, probably will consider vasodilative therapy. Her walking distance is down from  > 300 meters.  Patient continues to have occasional palpitations and was noted to have PAC's on EKG. No changes in medications. Will continue to monitor.   Dysphagia has remained stable. She continues to have GERD-like symptoms. Continue to follow with GI. I will see her back in 3 months or sooner if problems with repeat 6 minute walk test.   Adrian Prows, MD, [facility] 08/11/2018, 10:09 PM [facility]. [place] Pager: [phone] Office: [phone] If no answer Cell [phone]

## 2018-08-12 ENCOUNTER — Ambulatory Visit (HOSPITAL_COMMUNITY): Payer: Medicare Other

## 2018-08-17 ENCOUNTER — Ambulatory Visit (HOSPITAL_COMMUNITY): Payer: Medicare Other

## 2018-08-19 ENCOUNTER — Ambulatory Visit (HOSPITAL_COMMUNITY): Payer: Medicare Other

## 2018-08-24 ENCOUNTER — Ambulatory Visit (HOSPITAL_COMMUNITY): Payer: Medicare Other

## 2018-08-26 ENCOUNTER — Ambulatory Visit (HOSPITAL_COMMUNITY): Payer: Medicare Other

## 2018-08-27 ENCOUNTER — Ambulatory Visit (INDEPENDENT_AMBULATORY_CARE_PROVIDER_SITE_OTHER): Payer: Medicare Other

## 2018-08-27 ENCOUNTER — Other Ambulatory Visit: Payer: Self-pay

## 2018-08-27 DIAGNOSIS — R1319 Other dysphagia: Secondary | ICD-10-CM

## 2018-08-27 DIAGNOSIS — I272 Pulmonary hypertension, unspecified: Secondary | ICD-10-CM

## 2018-08-30 NOTE — Progress Notes (Signed)
Lmom with details

## 2018-08-31 ENCOUNTER — Ambulatory Visit (HOSPITAL_COMMUNITY): Payer: Medicare Other

## 2018-09-02 ENCOUNTER — Ambulatory Visit (HOSPITAL_COMMUNITY): Payer: Medicare Other

## 2018-09-02 NOTE — Addendum Note (Signed)
Encounter addended by: Ivonne Andrew, RD on: 09/02/2018 3:02 PM  Actions taken: Flowsheet data copied forward, Visit Navigator Flowsheet section accepted

## 2018-09-07 ENCOUNTER — Other Ambulatory Visit: Payer: Self-pay

## 2018-09-07 ENCOUNTER — Encounter: Payer: Self-pay | Admitting: Nurse Practitioner

## 2018-09-07 ENCOUNTER — Ambulatory Visit (INDEPENDENT_AMBULATORY_CARE_PROVIDER_SITE_OTHER): Payer: Medicare Other | Admitting: Nurse Practitioner

## 2018-09-07 ENCOUNTER — Ambulatory Visit (INDEPENDENT_AMBULATORY_CARE_PROVIDER_SITE_OTHER): Payer: Medicare Other

## 2018-09-07 VITALS — BP 136/70 | HR 64 | Temp 98.1°F | Ht 63.6 in | Wt 149.4 lb

## 2018-09-07 VITALS — BP 136/70 | HR 64 | Temp 98.1°F | Ht 63.6 in | Wt 149.0 lb

## 2018-09-07 DIAGNOSIS — M79605 Pain in left leg: Secondary | ICD-10-CM | POA: Diagnosis not present

## 2018-09-07 DIAGNOSIS — Z0000 Encounter for general adult medical examination without abnormal findings: Secondary | ICD-10-CM | POA: Diagnosis not present

## 2018-09-07 DIAGNOSIS — M79604 Pain in right leg: Secondary | ICD-10-CM

## 2018-09-07 MED ORDER — DICLOFENAC SODIUM 1 % TD GEL: 2.0000 g | Freq: Four times a day (QID) | TRANSDERMAL | 1 refills | Status: DC
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
## 2018-09-07 NOTE — Patient Instructions (Signed)
Nancy Lambert , Thank you for taking time to come for your Medicare Wellness Visit. I appreciate your ongoing commitment to your health goals. Please review the following plan we discussed and let me know if I can assist you in the future.   Screening recommendations/referrals: Colonoscopy: 01/2017 Mammogram: 12/2017 Bone Density: 10/2016 Recommended yearly ophthalmology/optometry visit for glaucoma screening and checkup Recommended yearly dental visit for hygiene and checkup  Vaccinations: Influenza vaccine: 12/2017 Pneumococcal vaccine: 05/2011 Tdap vaccine: can't remember date Shingles vaccine: discussed    Advanced directives: Advance directive discussed with you today. Even though you declined this today please call our office should you change your mind and we can give you the proper paperwork for you to fill out.   Conditions/risks identified: overweight  Next appointment: 01/10/2019 at 10:30   Preventive Care 65 Years and Older, Female Preventive care refers to lifestyle choices and visits with your health care provider that can promote health and wellness. What does preventive care include?  A yearly physical exam. This is also called an annual well check.  Dental exams once or twice a year.  Routine eye exams. Ask your health care provider how often you should have your eyes checked.  Personal lifestyle choices, including:  Daily care of your teeth and gums.  Regular physical activity.  Eating a healthy diet.  Avoiding tobacco and drug use.  Limiting alcohol use.  Practicing safe sex.  Taking low-dose aspirin every day.  Taking vitamin and mineral supplements as recommended by your health care provider. What happens during an annual well check? The services and screenings done by your health care provider during your annual well check will depend on your age, overall health, lifestyle risk factors, and family history of disease. Counseling  Your health care  provider may ask you questions about your:  Alcohol use.  Tobacco use.  Drug use.  Emotional well-being.  Home and relationship well-being.  Sexual activity.  Eating habits.  History of falls.  Memory and ability to understand (cognition).  Work and work Statistician.  Reproductive health. Screening  You may have the following tests or measurements:  Height, weight, and BMI.  Blood pressure.  Lipid and cholesterol levels. These may be checked every 5 years, or more frequently if you are over 76 years old.  Skin check.  Lung cancer screening. You may have this screening every year starting at age 76 if you have a 30-pack-year history of smoking and currently smoke or have quit within the past 15 years.  Fecal occult blood test (FOBT) of the stool. You may have this test every year starting at age 76.  Flexible sigmoidoscopy or colonoscopy. You may have a sigmoidoscopy every 5 years or a colonoscopy every 10 years starting at age 76.  Hepatitis C blood test.  Hepatitis B blood test.  Sexually transmitted disease (STD) testing.  Diabetes screening. This is done by checking your blood sugar (glucose) after you have not eaten for a while (fasting). You may have this done every 1-3 years.  Bone density scan. This is done to screen for osteoporosis. You may have this done starting at age 76.  Mammogram. This may be done every 1-2 years. Talk to your health care provider about how often you should have regular mammograms. Talk with your health care provider about your test results, treatment options, and if necessary, the need for more tests. Vaccines  Your health care provider may recommend certain vaccines, such as:  Influenza vaccine. This is  recommended every year.  Tetanus, diphtheria, and acellular pertussis (Tdap, Td) vaccine. You may need a Td booster every 10 years.  Zoster vaccine. You may need this after age 76.  Pneumococcal 13-valent conjugate (PCV13)  vaccine. One dose is recommended after age 76.  Pneumococcal polysaccharide (PPSV23) vaccine. One dose is recommended after age 76. Talk to your health care provider about which screenings and vaccines you need and how often you need them. This information is not intended to replace advice given to you by your health care provider. Make sure you discuss any questions you have with your health care provider. Document Released: 04/13/2015 Document Revised: 12/05/2015 Document Reviewed: 01/16/2015 Elsevier Interactive Patient Education  2017 [place] Prevention in the Home Falls can cause injuries. They can happen to people of all ages. There are many things you can do to make your home safe and to help prevent falls. What can I do on the outside of my home?  Regularly fix the edges of walkways and driveways and fix any cracks.  Remove anything that might make you trip as you walk through a door, such as a raised step or threshold.  Trim any bushes or trees on the path to your home.  Use bright outdoor lighting.  Clear any walking paths of anything that might make someone trip, such as rocks or tools.  Regularly check to see if handrails are loose or broken. Make sure that both sides of any steps have handrails.  Any raised decks and porches should have guardrails on the edges.  Have any leaves, snow, or ice cleared regularly.  Use sand or salt on walking paths during winter.  Clean up any spills in your garage right away. This includes oil or grease spills. What can I do in the bathroom?  Use night lights.  Install grab bars by the toilet and in the tub and shower. Do not use towel bars as grab bars.  Use non-skid mats or decals in the tub or shower.  If you need to sit down in the shower, use a plastic, non-slip stool.  Keep the floor dry. Clean up any water that spills on the floor as soon as it happens.  Remove soap buildup in the tub or shower regularly.   Attach bath mats securely with double-sided non-slip rug tape.  Do not have throw rugs and other things on the floor that can make you trip. What can I do in the bedroom?  Use night lights.  Make sure that you have a light by your bed that is easy to reach.  Do not use any sheets or blankets that are too big for your bed. They should not hang down onto the floor.  Have a firm chair that has side arms. You can use this for support while you get dressed.  Do not have throw rugs and other things on the floor that can make you trip. What can I do in the kitchen?  Clean up any spills right away.  Avoid walking on wet floors.  Keep items that you use a lot in easy-to-reach places.  If you need to reach something above you, use a strong step stool that has a grab bar.  Keep electrical cords out of the way.  Do not use floor polish or wax that makes floors slippery. If you must use wax, use non-skid floor wax.  Do not have throw rugs and other things on the floor that can make you trip.  What can I do with my stairs?  Do not leave any items on the stairs.  Make sure that there are handrails on both sides of the stairs and use them. Fix handrails that are broken or loose. Make sure that handrails are as long as the stairways.  Check any carpeting to make sure that it is firmly attached to the stairs. Fix any carpet that is loose or worn.  Avoid having throw rugs at the top or bottom of the stairs. If you do have throw rugs, attach them to the floor with carpet tape.  Make sure that you have a light switch at the top of the stairs and the bottom of the stairs. If you do not have them, ask someone to add them for you. What else can I do to help prevent falls?  Wear shoes that:  Do not have high heels.  Have rubber bottoms.  Are comfortable and fit you well.  Are closed at the toe. Do not wear sandals.  If you use a stepladder:  Make sure that it is fully opened. Do not climb  a closed stepladder.  Make sure that both sides of the stepladder are locked into place.  Ask someone to hold it for you, if possible.  Clearly mark and make sure that you can see:  Any grab bars or handrails.  First and last steps.  Where the edge of each step is.  Use tools that help you move around (mobility aids) if they are needed. These include:  Canes.  Walkers.  Scooters.  Crutches.  Turn on the lights when you go into a dark area. Replace any light bulbs as soon as they burn out.  Set up your furniture so you have a clear path. Avoid moving your furniture around.  If any of your floors are uneven, fix them.  If there are any pets around you, be aware of where they are.  Review your medicines with your doctor. Some medicines can make you feel dizzy. This can increase your chance of falling. Ask your doctor what other things that you can do to help prevent falls. This information is not intended to replace advice given to you by your health care provider. Make sure you discuss any questions you have with your health care provider. Document Released: 01/11/2009 Document Revised: 08/23/2015 Document Reviewed: 04/21/2014 Elsevier Interactive Patient Education  2017 [employer].

## 2018-09-07 NOTE — Progress Notes (Signed)
Subjective:     Patient ID: Nancy Lambert , female    DOB: 08-18-1940 , 78 y.o.   MRN: [id number]   Chief Complaint  Patient presents with  . foot concern  . handicap placard    HPI  Leg Pain   The incident occurred more than 1 week ago. There was no injury mechanism. Pain location: front of bilateral legs. The quality of the pain is described as aching and shooting. Pertinent negatives include no numbness or tingling. Associated symptoms comments: Sometimes will awaken her at night.. Nothing aggravates the symptoms. She has tried nothing for the symptoms.     Past Medical History:  Diagnosis Date  . Anxiety   . Cardiac arrhythmia   . Glaucoma   . Hypertension   . 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 Scleroderma ([place])      Family History  Problem Relation Age of Onset  . Breast cancer Mother   . Clotting disorder Mother   . Diabetes Mother   . Breast cancer Sister   . Clotting disorder Sister   . Prostate cancer Son   . Breast cancer Daughter   . Other Father        drowned  . Breast cancer Sister   . Breast cancer Other   . Colon cancer Neg Hx      Current Outpatient Medications:  .  aspirin EC 81 MG tablet, Take 81 mg by mouth daily., Disp: , Rfl:  .  omeprazole (PRILOSEC) 40 MG capsule, TAKE 1 CAPSULE BY MOUTH ONCE DAILY. TAKE 30 MINUTES BEFORE FIRST MEAL OF THE DAY, Disp: 90 capsule, Rfl: 3 .  Riociguat (ADEMPAS) 2 MG TABS, Take 1 tablet by mouth 3 (three) times daily. , Disp: , Rfl:    No Known Allergies   Review of Systems  Constitutional: Negative for chills.  Respiratory: Negative.   Cardiovascular: Negative.   Musculoskeletal:       Right foot pain however she is being seen by podiatry  Skin: Negative.   Neurological: Negative for dizziness, tingling and numbness.     Today's Vitals   09/07/18 1049  BP: 136/70  Pulse: 64  Temp: 98.1 F (36.7 C)  TempSrc: Oral  Weight: 149 lb 6.4 oz (67.8 kg)  Height: 5' 3.6" (1.615 m)  PainSc:  0-No pain   Body mass index is 25.97 kg/m.   Objective:  Physical Exam Vitals signs reviewed.  Constitutional:      General: She is not in acute distress.    Appearance: Normal appearance.  Cardiovascular:     Rate and Rhythm: Normal rate and regular rhythm.     Pulses: Normal pulses.     Heart sounds: Normal heart sounds. No murmur.  Pulmonary:     Effort: Pulmonary effort is normal.     Breath sounds: Normal breath sounds.  Musculoskeletal: Normal range of motion.        General: No swelling (negative to bilateral feet) or tenderness.     Right lower leg: No edema.     Left lower leg: No edema.  Skin:    General: Skin is warm and dry.     Capillary Refill: Capillary refill takes less than 2 seconds.  Neurological:     General: No focal deficit present.     Mental Status: She is alert and oriented to person, place, and time.  Psychiatric:        Mood and Affect: Mood normal.  Behavior: Behavior normal.        Thought Content: Thought content normal.        Judgment: Judgment normal.         Assessment And Plan:     1. Bilateral leg pain  Describes likely arthritis type pain, will try her on diclofenac gel  She is to follow up with podiatry for her foot pain - diclofenac sodium (VOLTAREN) 1 % GEL; Apply 2 g topically 4 (four) times daily.  Dispense: 1 Tube; Refill: 1   Minette Brine, FNP    THE PATIENT IS ENCOURAGED TO PRACTICE SOCIAL DISTANCING DUE TO THE COVID-19 PANDEMIC.

## 2018-09-20 NOTE — Addendum Note (Signed)
Encounter addended by: Lance Morin, RN on: 09/20/2018 9:05 AM  Actions taken: Clinical Note Signed, Episode resolved

## 2018-09-20 NOTE — Progress Notes (Signed)
Discharge Progress Report  Patient Details  Name: Nancy Lambert MRN: [id number] Date of Birth: 1941-03-14 Referring Provider:     Pulmonary Rehab Walk Test from 03/11/2018 in [facility]  Referring Provider  Dr. Einar Gip       Number of Visits: 14  Reason for Discharge:  Early Exit:  due to department closure during covid pandemic precautions  Smoking History:  Social History   Tobacco Use  Smoking Status Former Smoker  . Types: Cigarettes  . Quit date: 78  . Years since quitting: 49.5  Smokeless Tobacco Never Used    Diagnosis:  Pulmonary hypertension ([place])  ADL [facility]:   Initial Exercise Prescription:   Discharge Exercise Prescription (Final Exercise Prescription Changes): Exercise Prescription Changes - 06/17/18 1500      Response to Exercise   Blood Pressure (Admit)  130/68    Blood Pressure (Exercise)  138/60    Blood Pressure (Exit)  116/64    Heart Rate (Admit)  64 bpm    Heart Rate (Exercise)  92 bpm    Heart Rate (Exit)  68 bpm    Oxygen Saturation (Admit)  95 %    Oxygen Saturation (Exercise)  94 %    Oxygen Saturation (Exit)  97 %    Rating of Perceived Exertion (Exercise)  14    Perceived Dyspnea (Exercise)  3    Duration  Progress to 45 minutes of aerobic exercise without signs/symptoms of physical distress    Intensity  THRR unchanged      Progression   Progression  Continue to progress workloads to maintain intensity without signs/symptoms of physical distress.      Resistance Training   Training Prescription  Yes    Weight  orange bands    Reps  10-15    Time  10 Minutes      Interval Training   Interval Training  No      Bike   Level  3    Minutes  17      Track   Laps  16    Minutes  17       Functional Capacity:   Psychological, QOL, Others - Outcomes: PHQ 2/9: Depression screen [facility] 2/9 09/07/2018 09/07/2018 03/08/2018  Decreased Interest 0 3 1  Down, Depressed, Hopeless 0 3 0  PHQ - 2  Score 0 6 1  Altered sleeping 0 2 1  Tired, decreased energy 1 3 1   Change in appetite 0 0 0  Feeling bad or failure about yourself  0 0 0  Trouble concentrating 0 0 0  Moving slowly or fidgety/restless 0 0 0  Suicidal thoughts 0 0 0  PHQ-9 Score 1 11 3   Difficult doing work/chores - Somewhat difficult Not difficult at all    Quality of Life:   Personal Goals: Goals established at orientation with interventions provided to work toward goal.    Personal Goals Discharge: Goals and Risk Factor Review    Row Name 04/07/18 1517 05/04/18 1003 05/31/18 1501 06/21/18 1142       Core Components/Risk Factors/Patient Goals Review   Personal Goals Review  Develop more efficient breathing techniques such as purse lipped breathing and diaphragmatic breathing and practicing self-pacing with activity.;Increase knowledge of respiratory medications and ability to use respiratory devices properly.;Improve shortness of breath with ADL's  Develop more efficient breathing techniques such as purse lipped breathing and diaphragmatic breathing and practicing self-pacing with activity.;Increase knowledge of respiratory medications and ability to use  respiratory devices properly.;Improve shortness of breath with ADL's  Develop more efficient breathing techniques such as purse lipped breathing and diaphragmatic breathing and practicing self-pacing with activity.;Increase knowledge of respiratory medications and ability to use respiratory devices properly.;Improve shortness of breath with ADL's  Develop more efficient breathing techniques such as purse lipped breathing and diaphragmatic breathing and practicing self-pacing with activity.;Increase knowledge of respiratory medications and ability to use respiratory devices properly.;Improve shortness of breath with ADL's    Review  Pt has completed 2 exercise sessions and 1 education class.  Pt able to demonstrate effective PLB techniques with verbal cues. Pt workloads  nustep level 3, Track 13 laps and Recumbent bike 2.0.  Will monitor pt progress during the next 30 day review.  Pt has completed 8 exercise sessions and 4 education class.  Pt able to demonstrate effective PLB techniques with verbal cues. Pt workloads increased on nustep to level 4, Track average increased to 16-20 laps and Recumbent bike level increased to level 3.  Will monitor pt progress during the next 30 day review.  Pt has completed 10 exercise sessions and 5 education classes. Pt was absent from rehab for several weeks d/t contiuing battle with depression and time of year her child passed away. Pt able to demonstrate effective PLB techniques with verbal cues. Pt workloads maintained on nustep at level 4, Track average increased to 16-20 laps and Recumbent bike level maintained at level 3.  Will monitor pt progress during the next 30 day review.  Pt has completed 14 exercise sessions and 7 education classes. Pt attendance has become more steady. Pt able to demonstrate effective PLB techniques with verbal cues. Pt workloads increased on nustep at level 5, Track average increased to 16-20 laps and Recumbent bike level maintained at level 3.  Rehab is currently closed and will plan to re-evaulate pt progress upon reopening.    Expected Outcomes  See Admission Goals and Outcomes  See Admission Goals and Outcomes  See Admission Goals and Outcomes  See Admission Goals and Outcomes       Exercise Goals and Review:   Exercise Goals Re-Evaluation: Exercise Goals Re-Evaluation    Row Name 04/06/18 0719 04/06/18 1500 05/04/18 0734 06/01/18 0728 06/21/18 0927     Exercise Goal Re-Evaluation   Exercise Goals Review  Increase Physical Activity;Increase Strength and Stamina;Able to understand and use rate of perceived exertion (RPE) scale;Able to understand and use Dyspnea scale;Knowledge and understanding of Target Heart Rate Range (THRR);Understanding of Exercise Prescription  Increase Physical Activity   Increase Physical Activity;Increase Strength and Stamina;Able to understand and use rate of perceived exertion (RPE) scale;Able to understand and use Dyspnea scale;Knowledge and understanding of Target Heart Rate Range (THRR);Improve claudication pain tolerance and improve walking ability  Increase Physical Activity;Increase Strength and Stamina;Able to understand and use rate of perceived exertion (RPE) scale;Able to understand and use Dyspnea scale;Knowledge and understanding of Target Heart Rate Range (THRR);Improve claudication pain tolerance and improve walking ability  Increase Physical Activity;Increase Strength and Stamina;Able to understand and use rate of perceived exertion (RPE) scale;Able to understand and use Dyspnea scale;Knowledge and understanding of Target Heart Rate Range (THRR);Improve claudication pain tolerance and improve walking ability   Comments  Pt has only attended 1 exercise session and has missed the last 4. Pt states that her daughter passed away around this time last year and that this time of year is hard on her. Unsure if pt will continue with program.   -  Pt has  now been coming to rehab reguarly. When pt pushes herself she can walk 20 laps (1 lap=200 ft) around the track in 15 minutes. Pt is reluctant towards workload increases but will accept them. Will continue to monitor and progress as able.   Pt missed almost a month of pulmonary rehab. Pt states that she was in a depressed state. Pt has been physicall progressing, but often lacks motivation to push herself to a somewhat hard level. Pt has increased her METs on the stepper to 2.9. Will continue to monitor and progress as able.   Pt continued to exercise around 2.8 METs. Pt had been more cheeful and motivated upon returning from her layoff. Department is currently closed for likely the next few weeks. Pt has previously been instructed on home exercise. Will follow up again with patient this week with more updates as I recieve  them.   Expected Outcomes  Through exercise at rehab and at home, the patient will decrease shortness of breath with daily activities and feel confident in carrying out an exercise regime at home.   -  Through exercise at rehab and at home, the patient will decrease shortness of breath with daily activities and feel confident in carrying out an exercise regime at home.   Through exercise at rehab and at home, the patient will decrease shortness of breath with daily activities and feel confident in carrying out an exercise regime at home.   Through exercise at rehab and at home, the patient will decrease shortness of breath with daily activities and feel confident in carrying out an exercise regime at home.       Nutrition & Weight - Outcomes:    Nutrition: Nutrition Therapy & Goals - 04/06/18 1204      Nutrition Therapy   Diet  general healthful      Personal Nutrition Goals   Nutrition Goal  Pt to identify and limit food sources of sodium    Personal Goal #2  The pt will recognize symptoms that can interfere with adequate oral intake    Personal Goal #3  The pt will have family and friends shop for food when necessary so that nourishing foods are always available at home.      Intervention Plan   Intervention  Prescribe, educate and counsel regarding individualized specific dietary modifications aiming towards targeted core components such as weight, hypertension, lipid management, diabetes, heart failure and other comorbidities.    Expected Outcomes  Short Term Goal: Understand basic principles of dietary content, such as calories, fat, sodium, cholesterol and nutrients.;Long Term Goal: Adherence to prescribed nutrition plan.       Nutrition Discharge: Nutrition Assessments - 09/02/18 1501      Rate Your Plate Scores   Pre Score  42    Post Score  --   pt did not complete post survey      Education Questionnaire Score:   Goals reviewed with patient; copy given to patient.

## 2018-09-28 ENCOUNTER — Other Ambulatory Visit: Payer: Self-pay

## 2018-09-28 ENCOUNTER — Ambulatory Visit (INDEPENDENT_AMBULATORY_CARE_PROVIDER_SITE_OTHER): Payer: Medicare Other | Admitting: Cardiology

## 2018-09-28 ENCOUNTER — Encounter: Payer: Self-pay | Admitting: Cardiology

## 2018-09-28 VITALS — BP 155/79 | HR 77 | Temp 98.2°F | Ht 64.0 in | Wt 139.0 lb

## 2018-09-28 DIAGNOSIS — R002 Palpitations: Secondary | ICD-10-CM

## 2018-09-28 DIAGNOSIS — R0609 Other forms of dyspnea: Secondary | ICD-10-CM | POA: Diagnosis not present

## 2018-09-28 DIAGNOSIS — I272 Pulmonary hypertension, unspecified: Secondary | ICD-10-CM | POA: Diagnosis not present

## 2018-09-28 DIAGNOSIS — R0600 Dyspnea, unspecified: Secondary | ICD-10-CM

## 2018-09-28 NOTE — Progress Notes (Signed)
Primary Physician/Referring:  Glendale Chard, MD  Patient ID: Nancy Lambert, female    DOB: [birth year], 78 y.o.   MRN: [id number]  Chief Complaint  Patient presents with  . Palpitations    pt states she foes not feel well     HPI: ESMIRNA Lambert  is a 78 y.o. female  with Ms. 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 She has been started on Adempas for pulm HTN and has chronic dyspnea. She made an appointment to see me on an urgent basis stating that she does not feel well and is concerned about heart rate. On further questioning she said it was personal and about a week ago she had severe pain in her joints and in spite of taking Tylenol and Motrin she continued to have severe pain, but ended up smoking marijuana and felt well.  Since then she has been feeling guilty and has been worried about drug interaction with [employer].  She continues to have occasional palpitations related to her stress level. Occasional chest pain that resolves with changing positions.   She does continue to have difficulty swallowing pills even with food due to dry mouth and follows [facility].    Past Medical History:  Diagnosis Date  . Anxiety   . Cardiac arrhythmia   . Glaucoma   . Hypertension   . 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[place])   . Scleroderma [facility])     Past Surgical History:  Procedure Laterality Date  . CARDIAC CATHETERIZATION N/A 07/24/2015   Procedure: Right Heart Cath;  Surgeon: Adrian Prows, MD;  Location: [facility];  Service: Cardiovascular;  Laterality: N/A;  . VESICOVAGINAL FISTULA CLOSURE W/ TAH      Social History   Socioeconomic History  . Marital status: Widowed    Spouse name: Not on file  . Number of children: 4  . Years of education: Not on file  . Highest education level: Not on file  Occupational History  . Occupation: Retired  Scientific laboratory technician  . Financial  resource strain: Not hard at all  . Food insecurity    Worry: Never true    Inability: Never true  . Transportation needs    Medical: No    Non-medical: No  Tobacco Use  . Smoking status: Former Smoker    Types: Cigarettes    Quit date: 1971    Years since quitting: 49.5  . Smokeless tobacco: Never Used  Substance and Sexual Activity  . Alcohol use: Yes    Alcohol/week: 0.0 standard drinks    Comment: occasional  . Drug use: No  . Sexual activity: Yes  Lifestyle  . Physical activity    Days per week: 2 days    Minutes per session: 20 min  . Stress: Not at all  Relationships  . Social Herbalist on phone: Not on file    Gets together: Not on file    Attends religious service: Not on file    Active member of club or organization: Not on file    Attends meetings of clubs or organizations: Not on file    Relationship status: Not on file  . Intimate partner violence    Fear of current or ex partner: No    Emotionally abused: No    Physically abused: No    Forced sexual activity: No  Other Topics Concern  . Not on file  Social History Narrative  .  Not on file   Review of Systems  Constitution: Negative for decreased appetite.  Cardiovascular: Positive for chest pain (occasional with certain GERD), dyspnea on exertion (stable) and palpitations. Negative for leg swelling (improved since starting therapy), orthopnea and syncope.  Respiratory: Positive for shortness of breath. Negative for wheezing.   Endocrine: Negative for cold intolerance and heat intolerance.  Hematologic/Lymphatic: Does not bruise/bleed easily.  Musculoskeletal: Positive for joint pain (hands and left hip).  Gastrointestinal: Positive for dysphagia (chronic) and heartburn. Negative for diarrhea, nausea and vomiting.  Neurological: Negative for dizziness.  All other systems reviewed and are negative.     Objective  Blood pressure (!) 155/79, pulse 77, temperature 98.2 F (36.8 C), weight  139 lb (63 kg), SpO2 99 %. Body mass index is 24.16 kg/m.    Physical Exam  Constitutional: She appears well-developed and well-nourished.  HENT:  Head: Normocephalic and atraumatic.  Neck: Normal range of motion. Carotid bruit is not present. No thyromegaly present.  Cardiovascular: Normal rate, regular rhythm and S1 normal.  Murmur heard.  Systolic murmur is present with a grade of 3/6 at the upper right sternal border and lower left sternal border. Pulses:      Femoral pulses are 2+ on the right side and 2+ on the left side.      Popliteal pulses are 2+ on the right side and 2+ on the left side.       Dorsalis pedis pulses are 0 on the right side and 1+ on the left side.       Posterior tibial pulses are 0 on the right side and 0 on the left side.  Increased S2  Pulmonary/Chest: Effort normal.  Abdominal: Soft. She exhibits no ascites. There is hepatomegaly.  Musculoskeletal: Normal range of motion.  Neurological: She is alert.  Skin: Skin is warm, dry and intact.  Abnormally dry due to scleroderma  Vitals reviewed.  Radiology: No results found.  Laboratory examination:    CMP Latest Ref Rng & Units 02/10/2018 12/30/2008 11/25/2008  Glucose 70 - 99 mg/dL 92 153(H) 82  BUN 8 - 23 mg/dL 10 11 10  DELTA CHECK NOTED  Creatinine 0.44 - 1.00 mg/dL 0.85 0.63 0.87  Sodium 135 - 145 mmol/L 142 140 138  Potassium 3.5 - 5.1 mmol/L 3.4(L) 3.1(L) 3.5  Chloride 98 - 111 mmol/L 108 105 108  CO2 22 - 32 mmol/L 26 24 24   Calcium 8.9 - 10.3 mg/dL 9.5 9.4 8.6  Total Protein 6.0 - 8.3 g/dL - 7.1 5.7(L)  Total Bilirubin 0.3 - 1.2 mg/dL - 1.0 0.6  Alkaline Phos 39 - 117 U/L - 54 37(L)  AST 0 - 37 U/L - 28 18  ALT 0 - 35 U/L - 11 12   CBC Latest Ref Rng & Units 02/10/2018 12/30/2008 11/25/2008  WBC 4.0 - 10.5 K/uL 4.1 13.0(H) 3.8(L)  Hemoglobin 12.0 - 15.0 g/dL 12.4 12.8 10.6 DELTA CHECK NOTED(L)  Hematocrit 36.0 - 46.0 % 40.7 38.9 32.1(L)  Platelets 150 - 400 K/uL 291 360 260   Lipid  Panel  No results found for: CHOL, TRIG, HDL, CHOLHDL, VLDL, LDLCALC, LDLDIRECT HEMOGLOBIN A1C No results found for: HGBA1C, MPG TSH No results for input(s): TSH in the last 8760 hours.  PRN Meds:. Medications Discontinued During This Encounter  Medication Reason  . diclofenac sodium (VOLTAREN) 1 % GEL Patient Preference   Current Meds  Medication Sig  . aspirin EC 81 MG tablet Take 81 mg by mouth daily.  Marland Kitchen  omeprazole (PRILOSEC) 40 MG capsule TAKE 1 CAPSULE BY MOUTH ONCE DAILY. TAKE 30 MINUTES BEFORE FIRST MEAL OF THE DAY  . Riociguat (ADEMPAS) 2 MG TABS Take 1 tablet by mouth 3 (three) times daily.     Cardiac Studies:   Echo- 01/13/2018 1. Left ventricle cavity is normal in size. Moderate concentric hypertrophy of the left ventricle. Normal global wall motion. Calculated EF 65%. 2. Left atrial cavity is mildly dilated. 3. Right atrial cavity is slightly dilated. 4. Mild aortic valve leaflet calcification. Peak aortic velocity is at upper limits of normal. 5. Mild (Grade I) mitral regurgitation. 6. Moderate tricuspid regurgitation. PA pressure is normal, approx. 25 mm of Hg. 7. Mild pulmonic regurgitation. 8. Moderate pericardial effusion. No evidence of tamponade. 9. IVC is dilated with a respiratory response of >50%. 10. c.f. echo. of 07/22/2017, PA pressure is normal now, no other diagnostic change.  Lexiscan myoview stress test 06/18/2015: 1. Resting EKG demonstrates normal sinus rhythm, left axis deviation, cannot exclude inferior infarct old, Anterior infarct old.  Stress EKG was negative for myocardial ischemia.  Patient exercised for 4 minutes and 5 seconds and achieved 5.93 Mets.  There are frequent PVCs in the recovery phase of the stress test.  Stress terminated due to achieving target heart rate, 86% of MPHR.  Stress symptoms included dyspnea. 2. Myocardial perfusion imaging is normal. Overall left ventricular systolic function was normal without regional wall motion  abnormalities. The left ventricular ejection fraction was 62%.  Right heart cath 07/24/2015: Right Heart Pressure:  RA A Wave  13 mmHg,   RA V Wave  9 mmHg,   RA Mean  8 mmHg  RV Systolic Pressure  48 mmHg,   RV Diastolic Pressure  4 mmHg, RV EDP  11 mmHg  PA Systolic Pressure  48 mmHg,   PA Diastolic Pressure  14 mmHg,   PA Mean  29 mmHg. PW A Wave  19 mmHg,   PW V Wave  17 mmHg,   PW Mean  15 mmHg  QP/QS  1    Total pulmonary vascular resistance 7 Wood units.   Impression: Mild to moderate pulmonary hypertension. Wedge pressure although normal, in the upper end of spectrum suggesting the pulmonary hypertension could also be related to mild LV diastolic heart failure. Findings probably more consistent with primary pulmonary hypertension. Clinical correlation recommended.   _____________________________________________________________________________________________________________________  SIX MIN WALK 08/11/2018 05/04/2018 08/18/2016 05/28/2015  Medications - - ASA 81 tab and Prilosec 20mg  at 6:30am -  Supplimental Oxygen during Test? (L/min) No No No No  Laps 253.59 19 7 -  Partial Lap (in Meters) - 15.24 0 -  Baseline BP (sitting) - - 102/62 -  Baseline Heartrate 78 - 65 -  Baseline Dyspnea (Borg Scale) - - 1 -  Baseline Fatigue (Borg Scale) - - 2 -  Baseline SPO2 99 96 100 -  BP (sitting) - - 132/84 -  Heartrate 101 - 90 -  Dyspnea (Borg Scale) - - 1 -  Fatigue (Borg Scale) - - 2 -  SPO2 94 96 98 -  BP (sitting) - - 108/70 -  Heartrate 89 - 59 -  SPO2 99 - 100 -  Stopped or Paused before Six Minutes No No No -  Interpretation - - Dizziness -  Distance Completed - 661.24 336 -  Tech Comments: (No Data) - pt walked at a normal pace (per pt)- no desat at end of test, no breaks during test///amg  normal pace/no SOB//lmr  Provider Comments: 253.59 meters (No Data) - -    Assessment   1. Palpitations   2. Pulmonary hypertension ([place])   3. Dyspnea on exertion    EKG 09/28/2018:  Normal sinus rhythm at rate 78 bpm, left axis deviation, left and trifascicular block.  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  IVCD, LVH.  Abnormal EKG.  EKG 05/04/2018: Sinus bradycardia at the rate of 58 bpm with 1 PAC, left axis deviation, left anterior fasicular block. 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 Low voltage complexes. Abnormal EKG. No changes from EKG 01/25/2018  Recommendations:     Patient had an appointment to see me but does not want to tell check in personnel or our nurses the reason for the office visit but complained of palpitations.  On further questioning, patient used marijuana as she was in severe pain in her joints from scleroderma and unrelenting pain in spite of taking NSAIDs, felt well but has felt guilty since then and wanted to discuss with me.  I have reassured her, advised her that I'm not an expert in marijuana interactions as it is not well known however impurities and other drugs that can be mixed can potentially be very harmful.  She probably also will have to discuss with her rheumatologist regarding pain control.  With regard to pulmonary hypertension she has done extremely well. Dyspnea is stable  Patient continues to have occasional palpitations and was noted to have PAC's on EKG. No changes in medications. Will continue to monitor. BP elevated today probably related to anxiety.   I will see her back in 3 months as previously scheduled.   Adrian Prows, MD, [facility] 09/30/2018, [facility]. [place] Pager: [phone] Office: [phone] If no answer Cell [phone]

## 2018-09-30 ENCOUNTER — Encounter: Payer: Self-pay | Admitting: Cardiology

## 2018-10-11 ENCOUNTER — Other Ambulatory Visit: Payer: Self-pay

## 2018-10-11 MED ORDER — PREDNISONE 20 MG PO TABS: ORAL_TABLET | ORAL | 0 refills | Status: DC

## 2018-10-11 NOTE — Telephone Encounter (Signed)
The pt called and said that her right leg is hurting worse since her appt with Laurance Flatten, NP on June 9th.  The pt said that she thinks that she needed to see Dr.Sanders.  The pt was told that Dr Baird Cancer is out of the office this week. Laurance Flatten, NP said that it could be sciatic nerve pain and that she is sending in a prednisone dose pack for the pt to try to see if it will give her some relief and if it doesn't for the pt to call the office back.

## 2018-10-12 ENCOUNTER — Telehealth: Payer: Self-pay

## 2018-10-12 NOTE — Telephone Encounter (Signed)
I called pt to notify her to take prednisone 20mg  2 tabs once daily for 5 days. YRL,RMA

## 2018-10-18 ENCOUNTER — Ambulatory Visit (INDEPENDENT_AMBULATORY_CARE_PROVIDER_SITE_OTHER): Payer: Medicare Other | Admitting: Nurse Practitioner

## 2018-10-18 ENCOUNTER — Encounter: Payer: Self-pay | Admitting: Nurse Practitioner

## 2018-10-18 ENCOUNTER — Other Ambulatory Visit: Payer: Self-pay

## 2018-10-18 VITALS — BP 116/70 | HR 64 | Temp 98.8°F | Ht 63.6 in | Wt 141.8 lb

## 2018-10-18 DIAGNOSIS — M545 Low back pain, unspecified: Secondary | ICD-10-CM

## 2018-10-18 DIAGNOSIS — M79604 Pain in right leg: Secondary | ICD-10-CM | POA: Diagnosis not present

## 2018-10-18 MED ORDER — TRIAMCINOLONE ACETONIDE 40 MG/ML IJ SUSP
40.0000 mg | Freq: Once | INTRAMUSCULAR | Status: AC
  Administered 2018-10-18: 40 mg via INTRAMUSCULAR

## 2018-10-18 MED ORDER — KETOROLAC TROMETHAMINE 30 MG/ML IJ SOLN
30.0000 mg | Freq: Once | INTRAMUSCULAR | Status: AC
  Administered 2018-10-18: 30 mg via INTRAMUSCULAR

## 2018-10-18 MED ORDER — KETOROLAC TROMETHAMINE 60 MG/2ML IM SOLN: 60.0000 mg | Freq: Once | INTRAMUSCULAR | Status: DC

## 2018-10-18 NOTE — Progress Notes (Signed)
Subjective:     Patient ID: Nancy Lambert , female    DOB: [birth year] , 78 y.o.   MRN: [id number]   Chief Complaint  Patient presents with  . Back Pain    patient stated the prednisone did work but at night her back pain gets worse at night when it is cold    HPI  Back Pain This is a recurrent problem. The current episode started 1 to 4 weeks ago. The pain is present in the lumbar spine. The quality of the pain is described as aching. The pain is the same all the time. Pertinent negatives include no abdominal pain, chest pain or headaches. Risk factors include history of steroid use. She has tried analgesics for the symptoms.     Past Medical History:  Diagnosis Date  . Anxiety   . Cardiac arrhythmia   . Glaucoma   . Hypertension   . 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[place])   . Scleroderma ([place])      Family History  Problem Relation Age of Onset  . Breast cancer Mother   . Clotting disorder Mother   . Diabetes Mother   . Breast cancer Sister   . Clotting disorder Sister   . Prostate cancer Son   . Breast cancer Daughter   . Other Father        drowned  . Breast cancer Sister   . Breast cancer Other   . Colon cancer Neg Hx      Current Outpatient Medications:  .  aspirin EC 81 MG tablet, Take 81 mg by mouth daily., Disp: , Rfl:  .  omeprazole (PRILOSEC) 40 MG capsule, TAKE 1 CAPSULE BY MOUTH ONCE DAILY. TAKE 30 MINUTES BEFORE FIRST MEAL OF THE DAY, Disp: 90 capsule, Rfl: 3 .  Riociguat (ADEMPAS) 2 MG TABS, Take 1 tablet by mouth 3 (three) times daily. , Disp: , Rfl:  .  predniSONE (DELTASONE) 20 MG tablet, use as directed (Patient not taking: Reported on 10/18/2018), Disp: 30 tablet, Rfl: 0   No Known Allergies   Review of Systems  Constitutional: Negative.   Respiratory: Negative.   Cardiovascular: Negative.  Negative for chest pain, palpitations and leg swelling.  Gastrointestinal: Negative for abdominal pain.  Musculoskeletal: Positive for back pain.   Neurological: Negative for dizziness and headaches.     Today's Vitals   10/18/18 1439  BP: 116/70  Pulse: 64  Temp: 98.8 F (37.1 C)  TempSrc: Oral  Weight: 141 lb 12.8 oz (64.3 kg)  Height: 5' 3.6" (1.615 m)  PainSc: 6   PainLoc: Back   Body mass index is 24.65 kg/m.   Objective:  Physical Exam Constitutional:      Appearance: Normal appearance.  Cardiovascular:     Rate and Rhythm: Normal rate and regular rhythm.     Pulses: Normal pulses.     Heart sounds: Normal heart sounds. No murmur.  Pulmonary:     Effort: Pulmonary effort is normal. No respiratory distress.     Breath sounds: Normal breath sounds.  Musculoskeletal:        General: Tenderness (right thigh area) present. No swelling or signs of injury.  Skin:    Capillary Refill: Capillary refill takes less than 2 seconds.  Neurological:     General: No focal deficit present.     Mental Status: She is alert and oriented to person, place, and time.  Psychiatric:        Mood and Affect: Mood normal.  Behavior: Behavior normal.        Thought Content: Thought content normal.        Judgment: Judgment normal.         Assessment And Plan:     1. Acute right-sided low back pain without sciatica  Will treat with Toradol and Kenalog  If worsens she is to return to office.  2. Right leg pain  Radiating pain to right leg, hopefully the Kenalog will help this pain     Minette Brine, FNP    THE PATIENT IS ENCOURAGED TO PRACTICE SOCIAL DISTANCING DUE TO THE COVID-19 PANDEMIC.

## 2018-10-18 NOTE — Patient Instructions (Signed)
   Take over the counter vitamin D 3,000 units daily for joint/bone pain.

## 2018-10-26 ENCOUNTER — Telehealth: Payer: Self-pay

## 2018-10-26 NOTE — Telephone Encounter (Signed)
Left pt v/m to call the office I was calling to see how she is doing. YRL,RMA

## 2018-10-27 ENCOUNTER — Telehealth: Payer: Self-pay

## 2018-10-27 NOTE — Telephone Encounter (Signed)
Left message for pt to call back to make appt 

## 2018-10-28 ENCOUNTER — Ambulatory Visit (INDEPENDENT_AMBULATORY_CARE_PROVIDER_SITE_OTHER): Payer: Medicare Other | Admitting: Internal Medicine

## 2018-10-28 ENCOUNTER — Other Ambulatory Visit: Payer: Self-pay

## 2018-10-28 ENCOUNTER — Encounter: Payer: Self-pay | Admitting: Internal Medicine

## 2018-10-28 VITALS — BP 124/60 | HR 69 | Temp 98.4°F | Ht 63.6 in | Wt 140.6 lb

## 2018-10-28 DIAGNOSIS — M79651 Pain in right thigh: Secondary | ICD-10-CM | POA: Diagnosis not present

## 2018-10-28 DIAGNOSIS — R202 Paresthesia of skin: Secondary | ICD-10-CM

## 2018-10-28 DIAGNOSIS — M545 Low back pain, unspecified: Secondary | ICD-10-CM

## 2018-10-28 MED ORDER — GABAPENTIN 100 MG PO CAPS: ORAL_CAPSULE | ORAL | 0 refills | Status: DC

## 2018-10-28 NOTE — Progress Notes (Signed)
Subjective:     Patient ID: Nancy Lambert , female    DOB: 1941/02/26 , 78 y.o.   MRN: [id number]   Chief Complaint  Patient presents with  . Hip Pain    HPI  R hip pain onset since April, byt not as bad in May. Onset was bilaterally, but the L side resolved 2 months ago.  Pain located on R SI and hip has persisted and for the past 3 months she has an area of numbness on R distal anterior thigh region. At night time wakes her up and feels like a vibration on this area. When this sensation hits her during the day, she can walk it off and is tolerable.  Pain is provoked with cold weather, prolonged laying and sitting. Walking always makes it better.  She was seen last week and given toradol and steroid shot which helped for 24h only.  Has neg hx of DDD that she is aware of, never had MRI of her spine.   Past Medical History:  Diagnosis Date  . Anxiety   . Cardiac arrhythmia   . Glaucoma   . Hypertension   . 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[place])   . Scleroderma ([place])      Family History  Problem Relation Age of Onset  . Breast cancer Mother   . Clotting disorder Mother   . Diabetes Mother   . Breast cancer Sister   . Clotting disorder Sister   . Prostate cancer Son   . Breast cancer Daughter   . Other Father        drowned  . Breast cancer Sister   . Breast cancer Other   . Colon cancer Neg Hx      Current Outpatient Medications:  .  aspirin EC 81 MG tablet, Take 81 mg by mouth daily., Disp: , Rfl:  .  omeprazole (PRILOSEC) 40 MG capsule, TAKE 1 CAPSULE BY MOUTH ONCE DAILY. TAKE 30 MINUTES BEFORE FIRST MEAL OF THE DAY, Disp: 90 capsule, Rfl: 3 .  Riociguat (ADEMPAS) 2 MG TABS, Take 1 tablet by mouth 3 (three) times daily. , Disp: , Rfl:    No Known Allergies   Review of Systems  + for back pain, radiating to buttocks, and R thigh. + for R thigh paresthesia. Neg for urinary or bowel incontinence or abdominal pain.   Today's Vitals   10/28/18 0901  BP: 124/60   Pulse: 69  Temp: 98.4 F (36.9 C)  TempSrc: Oral  SpO2: 96%  Weight: 140 lb 9.6 oz (63.8 kg)  Height: 5' 3.6" (1.615 m)  PainSc: 8    Body mass index is 24.44 kg/m.   Objective:  Physical Exam Vitals signs and nursing note reviewed.  Constitutional:      General: She is not in acute distress.    Appearance: She is not toxic-appearing.  HENT:     Head: Normocephalic.     Right Ear: External ear normal.     Left Ear: External ear normal.     Nose: Nose normal.  Eyes:     General: No scleral icterus.    Conjunctiva/sclera: Conjunctivae normal.  Neck:     Musculoskeletal: Normal range of motion and neck supple.  Pulmonary:     Effort: Pulmonary effort is normal.  Musculoskeletal:        General: No signs of injury.     Right lower leg: No edema.     Left lower leg: No edema.     Comments:  BACK- has local tenderness on lower lumbar spine vertebral region, R SI and R glut region. SLR neg. ROM to R lateral region and posterior flexion provoked her R back pain. EXTREM- R thigh is normal, has normal sensation to light touch compared to the L thigh. Strength of lower extremities is symmetric. Hip ROM is normal, but causes some pain on SI region.   Skin:    General: Skin is warm.     Findings: No bruising, erythema, lesion or rash.  Neurological:     General: No focal deficit present.     Mental Status: She is alert and oriented to person, place, and time.     Sensory: No sensory deficit.     Motor: No weakness.     Gait: Gait normal.     Deep Tendon Reflexes: Reflexes normal.     Comments: Negative SLR  Psychiatric:        Behavior: Behavior normal.    Assessment And Plan:    1. Acute right-sided low back pain without sciatica- unresolved. - DG Lumbar Spine Complete; Future - Ambulatory referral to Physical Therapy - Ambulatory referral to Neurology   I placed her on Neurotin 100 mg qhs x 3 nights, then increase to 200 mg qhs x 3 nights, then stay on 300 mg qhs.  2.  Pain of right thigh- unresolved - Ambulatory referral to Neurology  3. 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    Sent to neurologist and I will have her try Neurotin   Zaharah Amir Shelby, PA-C    THE PATIENT IS ENCOURAGED TO PRACTICE SOCIAL DISTANCING DUE TO THE COVID-19 PANDEMIC.

## 2018-11-01 ENCOUNTER — Ambulatory Visit (INDEPENDENT_AMBULATORY_CARE_PROVIDER_SITE_OTHER): Payer: Medicare Other | Admitting: Neurology

## 2018-11-01 ENCOUNTER — Other Ambulatory Visit: Payer: Self-pay

## 2018-11-01 ENCOUNTER — Telehealth: Payer: Self-pay | Admitting: Neurology

## 2018-11-01 ENCOUNTER — Encounter: Payer: Self-pay | Admitting: Neurology

## 2018-11-01 VITALS — BP 130/80 | HR 64 | Temp 98.0°F | Ht 64.0 in | Wt 139.0 lb

## 2018-11-01 DIAGNOSIS — M5416 Radiculopathy, lumbar region: Secondary | ICD-10-CM | POA: Diagnosis not present

## 2018-11-01 DIAGNOSIS — R200 Anesthesia of skin: Secondary | ICD-10-CM

## 2018-11-01 DIAGNOSIS — R29898 Other symptoms and signs involving the musculoskeletal system: Secondary | ICD-10-CM

## 2018-11-01 DIAGNOSIS — R159 Full incontinence of feces: Secondary | ICD-10-CM

## 2018-11-01 DIAGNOSIS — M48062 Spinal stenosis, lumbar region with neurogenic claudication: Secondary | ICD-10-CM

## 2018-11-01 MED ORDER — GABAPENTIN 300 MG PO CAPS: 300.0000 mg | ORAL_CAPSULE | Freq: Three times a day (TID) | ORAL | 2 refills | Status: DC

## 2018-11-01 NOTE — Patient Instructions (Signed)
Gabapentin up to 3 x a day  Gabapentin capsules or tablets What is this medicine? GABAPENTIN (GA ba pen tin) is used to control seizures in certain types of epilepsy. It is also used to treat certain types of nerve pain. This medicine may be used for other purposes; ask your health care provider or pharmacist if you have questions. COMMON BRAND NAME(S): Active-PAC with Gabapentin, Gabarone, Neurontin What should I tell my health care provider before I take this medicine? They need to know if you have any of these conditions:  history of drug abuse or alcohol abuse problem  kidney disease  lung or breathing disease  suicidal thoughts, plans, or attempt; a previous suicide attempt by you or a family member  an unusual or allergic reaction to gabapentin, other medicines, foods, dyes, or preservatives  pregnant or trying to get pregnant  breast-feeding How should I use this medicine? Take this medicine by mouth with a glass of water. Follow the directions on the prescription label. You can take it with or without food. If it upsets your stomach, take it with food. Take your medicine at regular intervals. Do not take it more often than directed. Do not stop taking except on your doctor's advice. If you are directed to break the 600 or 800 mg tablets in half as part of your dose, the extra half tablet should be used for the next dose. If you have not used the extra half tablet within 28 days, it should be thrown away. A special MedGuide will be given to you by the pharmacist with each prescription and refill. Be sure to read this information carefully each time. Talk to your pediatrician regarding the use of this medicine in children. While this drug may be prescribed for children as young as 3 years for selected conditions, precautions do apply. Overdosage: If you think you have taken too much of this medicine contact a poison control center or emergency room at once. NOTE: This medicine is  only for you. Do not share this medicine with others. What if I miss a dose? If you miss a dose, take it as soon as you can. If it is almost time for your next dose, take only that dose. Do not take double or extra doses. What may interact with this medicine? This medicine may interact with the following medications:  alcohol  antihistamines for allergy, cough, and cold  certain medicines for anxiety or sleep  certain medicines for depression like amitriptyline, fluoxetine, sertraline  certain medicines for seizures like phenobarbital, primidone  certain medicines for stomach problems  general anesthetics like halothane, isoflurane, methoxyflurane, propofol  local anesthetics like lidocaine, pramoxine, tetracaine  medicines that relax muscles for surgery  narcotic medicines for pain  phenothiazines like chlorpromazine, mesoridazine, prochlorperazine, thioridazine This list may not describe all possible interactions. Give your health care provider a list of all the medicines, herbs, non-prescription drugs, or dietary supplements you use. Also tell them if you smoke, drink alcohol, or use illegal drugs. Some items may interact with your medicine. What should I watch for while using this medicine? Visit your doctor or health care provider for regular checks on your progress. You may want to keep a record at home of how you feel your condition is responding to treatment. You may want to share this information with your doctor or health care provider at each visit. You should contact your doctor or health care provider if your seizures get worse or if you have any  new types of seizures. Do not stop taking this medicine or any of your seizure medicines unless instructed by your doctor or health care provider. Stopping your medicine suddenly can increase your seizures or their severity. This medicine may cause serious skin reactions. They can happen weeks to months after starting the medicine.  Contact your health care provider right away if you notice fevers or flu-like symptoms with a rash. The rash may be red or purple and then turn into blisters or peeling of the skin. Or, you might notice a red rash with swelling of the face, lips or lymph nodes in your neck or under your arms. Wear a medical identification bracelet or chain if you are taking this medicine for seizures, and carry a card that lists all your medications. You may get drowsy, dizzy, or have blurred vision. Do not drive, use machinery, or do anything that needs mental alertness until you know how this medicine affects you. To reduce dizzy or fainting spells, do not sit or stand up quickly, especially if you are an older patient. Alcohol can increase drowsiness and dizziness. Avoid alcoholic drinks. Your mouth may get dry. Chewing sugarless gum or sucking hard candy, and drinking plenty of water will help. The use of this medicine may increase the chance of suicidal thoughts or actions. Pay special attention to how you are responding while on this medicine. Any worsening of mood, or thoughts of suicide or dying should be reported to your health care provider right away. Women who become pregnant while using this medicine may enroll in the [place] Pregnancy Registry by calling [id number]. This registry collects information about the safety of antiepileptic drug use during pregnancy. What side effects may I notice from receiving this medicine? Side effects that you should report to your doctor or health care professional as soon as possible:  allergic reactions like skin rash, itching or hives, swelling of the face, lips, or tongue  breathing problems  rash, fever, and swollen lymph nodes  redness, blistering, peeling or loosening of the skin, including inside the mouth  suicidal thoughts, mood changes Side effects that usually do not require medical attention (report to your doctor or health  care professional if they continue or are bothersome):  dizziness  drowsiness  headache  nausea, vomiting  swelling of ankles, feet, hands  tiredness This list may not describe all possible side effects. Call your doctor for medical advice about side effects. You may report side effects to FDA at 1-800-FDA-1088. Where should I keep my medicine? Keep out of reach of children. This medicine may cause accidental overdose and death if it taken by other adults, children, or pets. Mix any unused medicine with a substance like cat litter or coffee grounds. Then throw the medicine away in a sealed container like a sealed bag or a coffee can with a lid. Do not use the medicine after the expiration date. Store at room temperature between 15 and 30 degrees C (59 and 86 degrees F). NOTE: This sheet is a summary. It may not cover all possible information. If you have questions about this medicine, talk to your doctor, pharmacist, or health care provider.  2020 Elsevier/Gold Standard (2018-06-18 14:16:43)  Radicular Pain Radicular pain is a type of pain that spreads from your back or neck along a spinal nerve. Spinal nerves are nerves that leave the spinal cord and go to the muscles. Radicular pain is sometimes called radiculopathy, radiculitis, or a pinched nerve. When you  have this type of pain, you may also have weakness, numbness, or tingling in the area of your body that is supplied by the nerve. The pain may feel sharp and burning. Depending on which spinal nerve is affected, the pain may occur in the:  Neck area (cervical radicular pain). You may also feel pain, numbness, weakness, or tingling in the arms.  Mid-spine area (thoracic radicular pain). You would feel this pain in the back and chest. This type is rare.  Lower back area (lumbar radicular pain). You would feel this pain as low back pain. You may feel pain, numbness, weakness, or tingling in the buttocks or legs. Sciatica is a type of  lumbar radicular pain that shoots down the back of the leg. Radicular pain occurs when one of the spinal nerves becomes irritated or squeezed (compressed). It is often caused by something pushing on a spinal nerve, such as one of the bones of the spine (vertebrae) or one of the round cushions between vertebrae (intervertebral disks). This can result from:  An injury.  Wear and tear or aging of a disk.  The growth of a bone spur that pushes on the nerve. Radicular pain often goes away when you follow instructions from your health care provider for relieving pain at home. Follow these instructions at home: Managing pain      If directed, put ice on the affected area: ? Put ice in a plastic bag. ? Place a towel between your skin and the bag. ? Leave the ice on for 20 minutes, 2-3 times a day.  If directed, apply heat to the affected area as often as told by your health care provider. Use the heat source that your health care provider recommends, such as a moist heat pack or a heating pad. ? Place a towel between your skin and the heat source. ? Leave the heat on for 20-30 minutes. ? Remove the heat if your skin turns bright red. This is especially important if you are unable to feel pain, heat, or cold. You may have a greater risk of getting burned. Activity   Do not sit or rest in bed for long periods of time.  Try to stay as active as possible. Ask your health care provider what type of exercise or activity is best for you.  Avoid activities that make your pain worse, such as bending and lifting.  Do not lift anything that is heavier than 10 lb (4.5 kg), or the limit that you are told, until your health care provider says that it is safe.  Practice using proper technique when lifting items. Proper lifting technique involves bending your knees and rising up.  Do strength and range-of-motion exercises only as told by your health care provider or physical therapist. General  instructions  Take over-the-counter and prescription medicines only as told by your health care provider.  Pay attention to any changes in your symptoms.  Keep all follow-up visits as told by your health care provider. This is important. ? Your health care provider may send you to a physical therapist to help with this pain. Contact a health care provider if:  Your pain and other symptoms get worse.  Your pain medicine is not helping.  Your pain has not improved after a few weeks of home care.  You have a fever. Get help right away if:  You have severe pain, weakness, or numbness.  You have difficulty with bladder or bowel control. Summary  Radicular pain is  a type of pain that spreads from your back or neck along a spinal nerve.  When you have radicular pain, you may also have weakness, numbness, or tingling in the area of your body that is supplied by the nerve.  The pain may feel sharp or burning.  Radicular pain may be treated with ice, heat, medicines, or physical therapy. This information is not intended to replace advice given to you by your health care provider. Make sure you discuss any questions you have with your health care provider. Document Released: 04/24/2004 Document Revised: 09/29/2017 Document Reviewed: 09/29/2017 Elsevier Patient Education  2020 [employer].

## 2018-11-01 NOTE — Telephone Encounter (Signed)
[employer] medicare order sent to [facility]. No auth they will reach out to the patient to schedule.  

## 2018-11-01 NOTE — Progress Notes (Addendum)
[facility]    Provider:  Dr Jaynee Eagles Requesting Provider: Rodriguez-Southworth Primary Care Provider:  Glendale Chard, MD  CC:  Leg pain   HPI:  Nancy Lambert is a 78 y.o. female here as requested by  Rodriguez-Southworth for lumbar radiculopathy. pmhx scleroderma, pulm htn, tn, glaucoma, cardiac arrhythmia, anxiety. Started 3 months ago, Pin is in the inner thigh and outer thigh, all numb, coming from the back the lower back, radiating down the leg, painful and weak, shooting pains in the leg all the way down to the foot, the left side if fine, started bilaterally but now justthe right side, weakness, siting for long periods makes it worse, getting up and walking helps a little but she cannot walk too long without sitting, the pain is so severe she can;t sleep in bed. Severe pain, ongoing all day, can't even sleep at night, fell down the stairs but not recently. She had diarrhea but unclear if this is bowel incontinence, she says it is ongoing, no changes in urine. No other focal neurologic deficits, associated symptoms, inciting events or modifiable factors.   Reviewed notes, labs and imaging from outside physicians, which showed:  BMP, cbc 01/2018 unremarkable  I reviewed notes from  [facility], right hip pain since April, worsening, onset was bilaterally, but the left side resolved 2 months ago, pain located in the right SI and hip has persisted for 3 months and has an area of numbness right distal anterior thigh region.  At nighttime wakes her up.  The pain hits her during the day and she can walk it off and it is tolerable.  Pain is provoked with cold weather, prolonged laying and sitting, walking always makes it better.  She was seen last week and given Toradol and steroid shot which helped for 24 hours only.  Has negative history of degenerative disc disease never had MRI of her spine.  Exam showed local tenderness in the lower lumbar spine vertebral region,  right SI and right gluteal region, SLR negative, range of motion to right lateral region and posterior flexion provoked her right back pain, strength symmetric, range of motion is normal but causes some pain in the SI region.  She was referred to neurology, due to lumbar spine complete, and physical therapy, started on Neurontin.  Review of Systems: Patient complains of symptoms per HPI as well as the following symptoms: weakness, leg pain. Pertinent negatives and positives per HPI. All others negative.   Social History   Socioeconomic History   Marital status: Widowed    Spouse name: Not on file   Number of children: 4   Years of education: Not on file   Highest education level: Not on file  Occupational History   Occupation: Retired  Scientist, product/process development strain: Hard   Food insecurity    Worry: Sometimes true    Inability: Sometimes true   Transportation needs    Medical: No    Non-medical: No  Tobacco Use   Smoking status: Former Smoker    Types: Cigarettes    Quit date: 1971    Years since quitting: 49.6   Smokeless tobacco: Never Used   Tobacco comment: very light use when she did smoke  Substance and Sexual Activity   Alcohol use: Yes    Alcohol/week: 0.0 standard drinks    Comment: occasional   Drug use: No   Sexual activity: Yes  Lifestyle   Physical activity    Days per week: 2  days    Minutes per session: 20 min   Stress: Not at all  Relationships   Social connections    Talks on phone: Not on file    Gets together: Not on file    Attends religious service: Not on file    Active member of club or organization: Not on file    Attends meetings of clubs or organizations: Not on file    Relationship status: Not on file   Intimate partner violence    Fear of current or ex partner: No    Emotionally abused: No    Physically abused: No    Forced sexual activity: No  Other Topics Concern   Not on file  Social History Narrative    Lives at home with her daughter   Right handed    Family History  Problem Relation Age of Onset   Breast cancer Mother    Clotting disorder Mother    Diabetes Mother    Breast cancer Sister    Clotting disorder Sister    Prostate cancer Son    Breast cancer Daughter    Other Father        drowned   Breast cancer Sister    Breast cancer Other    Colon cancer Neg Hx     Past Medical History:  Diagnosis Date   Anxiety    Cardiac arrhythmia    Glaucoma    Hypertension    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[place])    Scleroderma ([place])     Patient Active Problem List   Diagnosis Date Noted   Acute right-sided low back pain without sciatica 10/18/2018   Right leg pain 09/07/2018   Scleroderma ([place]) 05/04/2018   Palpitations 05/04/2018   Acute pain of right shoulder 04/07/2018   Dysphagia assoc with limited scleroderma  08/20/2016   Pulmonary hypertension ([place]) 07/23/2015   Dyspnea on exertion 05/28/2015    Past Surgical History:  Procedure Laterality Date   CARDIAC CATHETERIZATION N/A 07/24/2015   Procedure: Right Heart Cath;  Surgeon: Adrian Prows, MD;  Location: [facility];  Service: Cardiovascular;  Laterality: N/A;   steroid shots  2020   for pain   VESICOVAGINAL FISTULA CLOSURE W/ TAH      Current Outpatient Medications  Medication Sig Dispense Refill   aspirin EC 81 MG tablet Take 81 mg by mouth daily.     omeprazole (PRILOSEC) 40 MG capsule TAKE 1 CAPSULE BY MOUTH ONCE DAILY. TAKE 30 MINUTES BEFORE FIRST MEAL OF THE DAY 90 capsule 3   Riociguat (ADEMPAS) 2 MG TABS Take 1 tablet by mouth 3 (three) times daily.      gabapentin (NEURONTIN) 300 MG capsule Take 1 capsule (300 mg total) by mouth 3 (three) times daily. 90 capsule 2   No current facility-administered medications for this visit.     Allergies as of 11/01/2018   (No Known Allergies)    Vitals: BP 130/80 (BP Location: Left Arm, Patient Position: Sitting)     Pulse 64    Temp 98 F (36.7 C) Comment: taken by front staff   Ht 5\' 4"  (1.626 m)    Wt 139 lb (63 kg)    BMI 23.86 kg/m  Last Weight:  Wt Readings from Last 1 Encounters:  11/01/18 139 lb (63 kg)   Last Height:   Ht Readings from Last 1 Encounters:  11/01/18 5\' 4"  (1.626 m)     Physical exam: Exam: Gen: NAD, conversant, well nourised, obese, well  groomed                     CV: RRR, no MRG. No Carotid Bruits. No peripheral edema, warm, nontender Eyes: Conjunctivae clear without exudates or hemorrhage  Neuro: Detailed Neurologic Exam  Speech:    Speech is normal; fluent and spontaneous with normal comprehension.  Cognition:    The patient is oriented to person, place, and time;     recent and remote memory intact;     language fluent;     normal attention, concentration,     fund of knowledge Cranial Nerves:    The pupils are equal, round, and reactive to light. Attempted fundoscopy could not visualize.  Visual fields are full to finger confrontation. Extraocular movements are intact. Trigeminal sensation is intact and the muscles of mastication are normal. The face is symmetric. The palate elevates in the midline. Hearing intact. Voice is normal. Shoulder shrug is normal. The tongue has normal motion without fasciculations.   Coordination:    No dysmetria.   Gait:    antalgic   Motor Observation:    No asymmetry, no atrophy, and no involuntary movements noted. Tone:    Normal muscle tone.    Posture:    Posture is normal. normal erect    Strength: right leg weakness 3/5 difficult exam due to pain otherwise symmetric.     Sensation: numbness anterior and medial thigh     Reflex Exam:  DTR's: absent right AJ otherwise deep tendon reflexes in the upper and lower extremities are normal bilaterally.   Toes:    The toes are downgoing bilaterally.   Clonus:    Clonus is absent.    Assessment/Plan:   78 y.o. female here as requested by  Rodriguez-Southworth for  lumbar radiculopathy. pmhx scleroderma, pulm htn, tn, glaucoma, cardiac arrhythmia, anxiety. Started 3 months ago, Pin is in the inner thigh and outer thigh, all numb, coming from the back the lower back, radiating down the leg, painful and weak, shooting pains in the leg all the way down to the foot  Needs MRI lumbar spine, saddle anesthesia, weakness, severe pain, bowel incontinence need to ensure there is no cauda equin syndrome, stenosis.   Addendum: MRI lumbar spine shows nerve impingements consistent with her symptoms at right L3 (she has radiating pain into the anterior and medial thigh) and right L5 impingement (She has radiating pain down the lateral leg to the top of the foot) and I will refer her to a surgeon for eval of injections vs surgery.   Orders Placed This Encounter  Procedures   MR LUMBAR SPINE WO CONTRAST   Meds ordered this encounter  Medications   gabapentin (NEURONTIN) 300 MG capsule    Sig: Take 1 capsule (300 mg total) by mouth 3 (three) times daily.    Dispense:  90 capsule    Refill:  2   We will see what the MR lumbar spine shows before following up, may need emg/ncs  Cc: Glendale Chard, MD,   [place], [facility] [address] [place], [place] [address]  Phone [phone] Fax [phone]

## 2018-11-19 ENCOUNTER — Other Ambulatory Visit: Payer: Self-pay

## 2018-11-19 ENCOUNTER — Ambulatory Visit
Admission: RE | Admit: 2018-11-19 | Discharge: 2018-11-19 | Disposition: A | Discharge status: Home / Self Care | Payer: Medicare Other | Source: Ambulatory Visit | Attending: Neurology | Admitting: Neurology

## 2018-11-19 ENCOUNTER — Ambulatory Visit
Admission: RE | Admit: 2018-11-19 | Discharge: 2018-11-19 | Disposition: A | Discharge status: Home / Self Care | Payer: Medicare Other | Source: Ambulatory Visit | Attending: Internal Medicine | Admitting: Internal Medicine

## 2018-11-19 DIAGNOSIS — R29898 Other symptoms and signs involving the musculoskeletal system: Secondary | ICD-10-CM

## 2018-11-19 DIAGNOSIS — R159 Full incontinence of feces: Secondary | ICD-10-CM

## 2018-11-19 DIAGNOSIS — R200 Anesthesia of skin: Secondary | ICD-10-CM

## 2018-11-19 DIAGNOSIS — M5416 Radiculopathy, lumbar region: Secondary | ICD-10-CM

## 2018-11-19 DIAGNOSIS — M545 Low back pain, unspecified: Secondary | ICD-10-CM

## 2018-11-19 DIAGNOSIS — M48062 Spinal stenosis, lumbar region with neurogenic claudication: Secondary | ICD-10-CM

## 2018-11-21 ENCOUNTER — Telehealth: Payer: Self-pay | Admitting: Neurology

## 2018-11-21 NOTE — Telephone Encounter (Signed)
Nancy Lambert, would you call patient and let her know that her MRI lumbar showed probable pinched nerves and her symptoms are likely due to these pinched nerves . I would recommend sending her to a surgeon to see if injections into the back may help or if she may need surgery. Please ask if she has any prior relationships with an orthopaedist or neurosurgeon and if she would like referral to them. Otherwise I can send her to someone. Thanks. (FYI Dr. Baird Cancer, [place], [place], [place])

## 2018-11-22 ENCOUNTER — Ambulatory Visit: Payer: Medicare Other | Admitting: Cardiology

## 2018-11-22 ENCOUNTER — Other Ambulatory Visit: Payer: Self-pay | Admitting: Neurology

## 2018-11-22 DIAGNOSIS — M5416 Radiculopathy, lumbar region: Secondary | ICD-10-CM

## 2018-11-22 NOTE — Telephone Encounter (Signed)
Sent to dr Vertell Limber at [facility] thanks

## 2018-11-22 NOTE — Telephone Encounter (Signed)
I spoke with the pt and discussed the MRI results and Dr. Cathren Laine recommendations to refer to orthopaedist or neurosurgeon to discuss options like injections or surgery if needed. The pt verbalized understanding. She is in agreement to see whomever Dr. Jaynee Eagles suggests as she is not aware of any past neuro/ortho surgeons she has seen. She verbalized appreciation for the call.

## 2018-12-09 ENCOUNTER — Encounter: Payer: Self-pay | Admitting: Cardiology

## 2018-12-09 ENCOUNTER — Ambulatory Visit (INDEPENDENT_AMBULATORY_CARE_PROVIDER_SITE_OTHER): Payer: Medicare Other | Admitting: Cardiology

## 2018-12-09 ENCOUNTER — Other Ambulatory Visit: Payer: Self-pay

## 2018-12-09 VITALS — BP 133/78 | HR 56 | Temp 98.7°F | Ht 64.0 in | Wt 142.0 lb

## 2018-12-09 DIAGNOSIS — R0602 Shortness of breath: Secondary | ICD-10-CM

## 2018-12-09 DIAGNOSIS — I272 Pulmonary hypertension, unspecified: Secondary | ICD-10-CM

## 2018-12-09 DIAGNOSIS — M349 Systemic sclerosis, unspecified: Secondary | ICD-10-CM | POA: Diagnosis not present

## 2018-12-09 DIAGNOSIS — R0609 Other forms of dyspnea: Secondary | ICD-10-CM | POA: Diagnosis not present

## 2018-12-09 DIAGNOSIS — R0600 Dyspnea, unspecified: Secondary | ICD-10-CM

## 2018-12-09 NOTE — Progress Notes (Signed)
Primary Physician/Referring:  Glendale Chard, MD  Patient ID: Nancy Lambert, female    DOB: 1940-08-02, 78 y.o.   MRN: [id number]  Chief Complaint  Patient presents with   Breathing Problem   Pulmonary Infiltrate    HPI: Nancy Lambert  is a 77 y.o. 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 She has been started on Adempas for pulm HTN and has chronic dyspnea. She continues to have occasional palpitations related to her stress level. Occasional chest pain that resolves with changing positions.   She does continue to have difficulty swallowing pills even with food due to dry mouth and follows [facility].  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 She is unsure who her rheumatologist is.  Past Medical History:  Diagnosis Date   Anxiety    Cardiac arrhythmia    Glaucoma    Hypertension    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[place])    Scleroderma ([place])     Past Surgical History:  Procedure Laterality Date   CARDIAC CATHETERIZATION N/A 07/24/2015   Procedure: Right Heart Cath;  Surgeon: Adrian Prows, MD;  Location: [facility];  Service: Cardiovascular;  Laterality: N/A;   steroid shots  2020   for pain   VESICOVAGINAL FISTULA CLOSURE W/ TAH      Social History   Socioeconomic History   Marital status: Widowed    Spouse name: Not on file   Number of children: 4   Years of education: Not on file   Highest education level: Not on file  Occupational History   Occupation: Retired  Scientist, product/process development strain: Hard   Food insecurity    Worry: Sometimes true    Inability: Sometimes true   Transportation needs    Medical: No    Non-medical: No  Tobacco Use   Smoking status: Former Smoker    Types: Cigarettes    Quit date: 1971    Years  since quitting: 49.7   Smokeless tobacco: Never Used   Tobacco comment: very light use when she did smoke  Substance and Sexual Activity   Alcohol use: Yes    Alcohol/week: 0.0 standard drinks    Comment: occasional   Drug use: No   Sexual activity: Yes  Lifestyle   Physical activity    Days per week: 2 days    Minutes per session: 20 min   Stress: Not at all  Relationships   Social connections    Talks on phone: Not on file    Gets together: Not on file    Attends religious service: Not on file    Active member of club or organization: Not on file    Attends meetings of clubs or organizations: Not on file    Relationship status: Not on file   Intimate partner violence    Fear of current or ex partner: No    Emotionally abused: No    Physically abused: No    Forced sexual activity: No  Other Topics Concern   Not on file  Social History Narrative   Lives at home with her daughter   Right handed   Review of Systems  Constitution: Positive for malaise/fatigue. Negative for decreased appetite.  Cardiovascular: Positive for chest pain (occasional with certain GERD), dyspnea on exertion (stable) and palpitations. Negative for leg swelling (improved  since starting therapy), orthopnea and syncope.  Respiratory: Positive for shortness of breath. Negative for wheezing.   Endocrine: Negative for cold intolerance and heat intolerance.  Hematologic/Lymphatic: Does not bruise/bleed easily.  Musculoskeletal: Positive for back pain, joint pain (hands and left hip) and joint swelling (right knee).  Gastrointestinal: Positive for dysphagia (chronic) and heartburn. Negative for diarrhea, nausea and vomiting.  Neurological: Negative for dizziness.  All other systems reviewed and are negative.     Objective  Blood pressure 133/78, pulse (!) 56, temperature 98.7 F (37.1 C), height 5\' 4"  (1.626 m), weight 142 lb (64.4 kg). Body mass index is 24.37 kg/m.    Physical Exam    Constitutional: She appears well-developed and well-nourished.  HENT:  Head: Normocephalic and atraumatic.  Neck: Normal range of motion. Carotid bruit is not present. No thyromegaly present.  Cardiovascular: Normal rate, regular rhythm and S1 normal.  Murmur heard.  Systolic murmur is present with a grade of 3/6 at the upper right sternal border and lower left sternal border. Pulses:      Femoral pulses are 2+ on the right side and 2+ on the left side.      Popliteal pulses are 2+ on the right side and 2+ on the left side.       Dorsalis pedis pulses are 0 on the right side and 1+ on the left side.       Posterior tibial pulses are 0 on the right side and 0 on the left side.  Increased S2  Pulmonary/Chest: Effort normal.  Abdominal: Soft. She exhibits no ascites. There is hepatomegaly.  Musculoskeletal: Normal range of motion.  Neurological: She is alert.  Skin: Skin is warm, dry and intact.  Abnormally dry due to scleroderma  Vitals reviewed.  Radiology: No results found.  Laboratory examination:    CMP Latest Ref Rng & Units 02/10/2018 12/30/2008 11/25/2008  Glucose 70 - 99 mg/dL 92 153(H) 82  BUN 8 - 23 mg/dL 10 11 10  DELTA CHECK NOTED  Creatinine 0.44 - 1.00 mg/dL 0.85 0.63 0.87  Sodium 135 - 145 mmol/L 142 140 138  Potassium 3.5 - 5.1 mmol/L 3.4(L) 3.1(L) 3.5  Chloride 98 - 111 mmol/L 108 105 108  CO2 22 - 32 mmol/L 26 24 24   Calcium 8.9 - 10.3 mg/dL 9.5 9.4 8.6  Total Protein 6.0 - 8.3 g/dL - 7.1 5.7(L)  Total Bilirubin 0.3 - 1.2 mg/dL - 1.0 0.6  Alkaline Phos 39 - 117 U/L - 54 37(L)  AST 0 - 37 U/L - 28 18  ALT 0 - 35 U/L - 11 12   CBC Latest Ref Rng & Units 02/10/2018 12/30/2008 11/25/2008  WBC 4.0 - 10.5 K/uL 4.1 13.0(H) 3.8(L)  Hemoglobin 12.0 - 15.0 g/dL 12.4 12.8 10.6 DELTA CHECK NOTED(L)  Hematocrit 36.0 - 46.0 % 40.7 38.9 32.1(L)  Platelets 150 - 400 K/uL 291 360 260   Lipid Panel  No results found for: CHOL, TRIG, HDL, CHOLHDL, VLDL, LDLCALC,  LDLDIRECT HEMOGLOBIN A1C No results found for: HGBA1C, MPG TSH No results for input(s): TSH in the last 8760 hours.  PRN Meds:. There are no discontinued medications. Current Meds  Medication Sig   aspirin EC 81 MG tablet Take 81 mg by mouth daily.   gabapentin (NEURONTIN) 300 MG capsule Take 1 capsule (300 mg total) by mouth 3 (three) times daily.   omeprazole (PRILOSEC) 40 MG capsule TAKE 1 CAPSULE BY MOUTH ONCE DAILY. TAKE 30 MINUTES BEFORE FIRST MEAL OF THE DAY  Riociguat (ADEMPAS) 2 MG TABS Take 1 tablet by mouth 3 (three) times daily.     Cardiac Studies:   Lexiscan myoview stress test 06/18/2015: 1. Resting EKG demonstrates normal sinus rhythm, left axis deviation, cannot exclude inferior infarct old, Anterior infarct old.  Stress EKG was negative for myocardial ischemia.  Patient exercised for 4 minutes and 5 seconds and achieved 5.93 Mets.  There are frequent PVCs in the recovery phase of the stress test.  Stress terminated due to achieving target heart rate, 86% of MPHR.  Stress symptoms included dyspnea. 2. Myocardial perfusion imaging is normal. Overall left ventricular systolic function was normal without regional wall motion abnormalities. The left ventricular ejection fraction was 62%.  Right heart cath 07/24/2015: Right Heart Pressure:  RA A Wave  13 mmHg,   RA V Wave  9 mmHg,   RA Mean  8 mmHg  RV Systolic Pressure  48 mmHg,   RV Diastolic Pressure  4 mmHg, RV EDP  11 mmHg  PA Systolic Pressure  48 mmHg,   PA Diastolic Pressure  14 mmHg,   PA Mean  29 mmHg. PW A Wave  19 mmHg,   PW V Wave  17 mmHg,   PW Mean  15 mmHg  QP/QS  1    Total pulmonary vascular resistance 7 Wood units.   Impression: Mild to moderate pulmonary hypertension. Wedge pressure although normal, in the upper end of spectrum suggesting the pulmonary hypertension could also be related to mild LV diastolic heart failure. Findings probably more consistent with primary pulmonary hypertension.  Clinical correlation recommended.   Echocardiogram 08/27/2018: Normal LV systolic function with EF 58%. Left ventricle cavity is normal in size. Moderate concentric hypertrophy of the left ventricle. Normal global wall motion. Doppler evidence of grade II (pseudonormal) diastolic dysfunction, elevated LAP. Calculated EF 58%. Right atrial cavity is mildly dilated. Mild to moderate mitral regurgitation. Mild to moderate tricuspid regurgitation. Mild pulmonary hypertension. Estimated pulmonary artery systolic pressure 37 mmHg.  Mild pulmonic regurgitation. Small pericardial effusion with no hemodynamic significance. IVC is dilated with a respiratory response of >50%. Estimated RA pressure 8 mmHg. Compared to previous study on 01/13/2018, PA pressure is mildly increased.  ____________________________________________________ [facility] WALK 12/13/2018 12/09/2018 08/11/2018 05/04/2018 08/18/2016 05/28/2015  Medications - - - - ASA 81 tab and Prilosec 20mg  at 6:30am -  Supplimental Oxygen during Test? (L/min) No No No No No No  Laps 12 12 253.59 19 7 -  Partial Lap (in Meters) 234.1 - - 15.24 0 -  Baseline BP (sitting) - - - - 102/62 -  Baseline Heartrate 66 66 78 - 65 -  Baseline Dyspnea (Borg Scale) - - - - 1 -  Baseline Fatigue (Borg Scale) - - - - 2 -  Baseline SPO2 100 - 99 96 100 -  BP (sitting) - - - - 132/84 -  Heartrate 52 52 101 - 90 -  Dyspnea (Borg Scale) - - - - 1 -  Fatigue (Borg Scale) - - - - 2 -  SPO2 53 53 94 96 98 -  BP (sitting) - - - - 108/70 -  Heartrate 73 66 89 - 59 -  SPO2 94 97 99 - 100 -  Stopped or Paused before Six Minutes No No No No No -  Interpretation Leg pain - - - Dizziness -  Distance Completed 642.1 - - 661.24 336 -  Tech Comments: - (No Data) (No Data) - pt walked at a normal pace (per  pt)- no desat at end of test, no breaks during test///amg  normal pace/no SOB//lmr  Provider Comments: - - 253.59 meters (No Data) - -    Assessment   1. Pulmonary  hypertension ([place])   2. Shortness of breath   3. Scleroderma ([place])   4. Dyspnea on exertion    EKG 09/28/2018: Normal sinus rhythm at rate 78 bpm, left axis deviation, left and trifascicular block.  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  IVCD, LVH.  Abnormal EKG.  EKG 05/04/2018: Sinus bradycardia at the rate of 58 bpm with 1 PAC, left axis deviation, left anterior fasicular block. 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 Low voltage complexes. Abnormal EKG. No changes from EKG 01/25/2018  Recommendations:   Nancy Lambert is a very 6-week visit, and seen her acutely when she presented with severe joint pains and abused marijuana and was feeling guilty about this.  She continues to have severe back pain, now she is complaining of sciatica-like pain in her right leg.  She feels more fatigued and tired.  Dyspnea has remained stable, she has not had any leg edema no orthopnea.  Suspected pulmonary hypertension is probably stable, scleroderma, associated arthritis and degenerative joint disease is majorly contributing to her poor overall health.  I come to find out that she does not have a rheumatologist.  Arrangements are being made for her referral as per patient.  I try to do 6-minute walk test, in spite of using multiple problems including forehead probe, I was unable to pick up oxygen saturation due to scleroderma.  However, her walking distance has stabilized to >500 meters.   No changes in the medications were done today.  I will repeat echocardiogram and see her back in 3 months.    With regard to pulmonary hypertension she has done extremely well. Dyspnea is stable  Adrian Prows, MD, [facility] 12/18/2018, 11:13 AM [facility]. [place] Pager: [phone] Office: [phone] If no answer Cell [phone]

## 2018-12-22 ENCOUNTER — Encounter: Payer: Self-pay | Admitting: Internal Medicine

## 2018-12-22 ENCOUNTER — Ambulatory Visit (INDEPENDENT_AMBULATORY_CARE_PROVIDER_SITE_OTHER): Payer: Medicare Other | Admitting: Internal Medicine

## 2018-12-22 VITALS — BP 140/78 | HR 77 | Temp 98.3°F | Ht 62.2 in | Wt 138.6 lb

## 2018-12-22 DIAGNOSIS — M25561 Pain in right knee: Secondary | ICD-10-CM

## 2018-12-22 DIAGNOSIS — Z23 Encounter for immunization: Secondary | ICD-10-CM

## 2018-12-22 DIAGNOSIS — M5416 Radiculopathy, lumbar region: Secondary | ICD-10-CM

## 2018-12-22 DIAGNOSIS — G8929 Other chronic pain: Secondary | ICD-10-CM

## 2018-12-22 DIAGNOSIS — W19XXXA Unspecified fall, initial encounter: Secondary | ICD-10-CM

## 2018-12-22 MED ORDER — DULOXETINE HCL 20 MG PO CPEP: 20.0000 mg | ORAL_CAPSULE | Freq: Every day | ORAL | 1 refills | Status: DC

## 2018-12-22 NOTE — Patient Instructions (Addendum)
Lumbosacral Radiculopathy Lumbosacral radiculopathy is a condition that involves the spinal nerves and nerve roots in the low back and bottom of the spine. The condition develops when these nerves and nerve roots move out of place or become inflamed and cause symptoms. What are the causes? This condition may be caused by:  Pressure from a disk that bulges out of place (herniated disk). A disk is a plate of soft cartilage that separates bones in the spine.  Disk changes that occur with age (disk degeneration).  A narrowing of the bones of the lower back (spinal stenosis).  A tumor.  An infection.  An injury that places sudden pressure on the disks that cushion the bones of your lower spine. What increases the risk? You are more likely to develop this condition if:  You are a female who is 30-50 years old.  You are a female who is 50-60 years old.  You use improper technique when lifting things.  You are overweight or live a sedentary lifestyle.  Your work requires frequent lifting.  You smoke.  You do repetitive activities that strain the spine. What are the signs or symptoms? Symptoms of this condition include:  Pain that goes down from your back into your legs (sciatica), usually on one side of the body. This is the most common symptom. The pain may be worse with sitting, coughing, or sneezing.  Pain and numbness in your legs.  Muscle weakness.  Tingling.  Loss of bladder control or bowel control. How is this diagnosed? This condition may be diagnosed based on:  Your symptoms and medical history.  A physical exam. If the pain is lasting, you may have tests, such as:  MRI scan.  X-ray.  CT scan.  A type of X-ray used to examine the spinal canal after injecting a dye into your spine (myelogram).  A test to measure how electrical impulses move through a nerve (nerve conduction study). How is this treated? Treatment may depend on the cause of the condition and  may include:  Working with a physical therapist.  Taking pain medicine.  Applying heat and ice to affected areas.  Doing stretches to improve flexibility.  Doing exercises to strengthen back muscles.  Having chiropractic spinal manipulation.  Using transcutaneous electrical nerve stimulation (TENS) therapy.  Getting a steroid injection in the spine. In some cases, no treatment is needed. If the condition is long-lasting (chronic), or if symptoms are severe, treatment may involve surgery or lifestyle changes, such as following a weight-loss plan. Follow these instructions at home: Activity  Avoid bending and other activities that make the problem worse.  Maintain a proper position when standing or sitting: ? When standing, keep your upper back and neck straight, with your shoulders pulled back. Avoid slouching. ? When sitting, keep your back straight and relax your shoulders. Do not round your shoulders or pull them backward.  Do not sit or stand in one place for long periods of time.  Take brief periods of rest throughout the day. This will reduce your pain. It is usually better to rest by lying down or standing, not sitting.  When you are resting for longer periods, mix in some mild activity or stretching between periods of rest. This will help to prevent stiffness and pain.  Get regular exercise. Ask your health care provider what activities are safe for you. If you were shown how to do any exercises or stretches, do them as directed by your health care provider.  Do   not lift anything that is heavier than 10 lb (4.5 kg) or the limit that you are told by your health care provider. Always use proper lifting technique, which includes: ? Bending your knees. ? Keeping the load close to your body. ? Avoiding twisting. Managing pain  If directed, put ice on the affected area: ? Put ice in a plastic bag. ? Place a towel between your skin and the bag. ? Leave the ice on for 20  minutes, 2-3 times a day.  If directed, apply heat to the affected area as often as told by your health care provider. Use the heat source that your health care provider recommends, such as a moist heat pack or a heating pad. ? Place a towel between your skin and the heat source. ? Leave the heat on for 20-30 minutes. ? Remove the heat if your skin turns bright red. This is especially important if you are unable to feel pain, heat, or cold. You may have a greater risk of getting burned.  Take over-the-counter and prescription medicines only as told by your health care provider. General instructions  Sleep on a firm mattress in a comfortable position. Try lying on your side with your knees slightly bent. If you lie on your back, put a pillow under your knees.  Do not drive or use heavy machinery while taking prescription pain medicine.  If your health care provider prescribed a diet or exercise program, follow it as directed.  Keep all follow-up visits as told by your health care provider. This is important. Contact a health care provider if:  Your pain does not improve over time, even when taking pain medicines. Get help right away if:  You develop severe pain.  Your pain suddenly gets worse.  You develop increasing weakness in your legs.  You lose the ability to control your bladder or bowel.  You have difficulty walking or balancing.  You have a fever. Summary  Lumbosacral radiculopathy is a condition that occurs when the spinal nerves and nerve roots in the lower part of the spine move out of place or become inflamed and cause symptoms.  Symptoms include pain, numbness, and tingling that go down from your back into your legs (sciatica), muscle weakness, and loss of bladder control or bowel control.  If directed, apply ice or heat to the affected area as told by your health care provider.  Follow instructions about activity, rest, and proper lifting technique. This  information is not intended to replace advice given to you by your health care provider. Make sure you discuss any questions you have with your health care provider. Document Released: 03/17/2005 Document Revised: 03/05/2017 Document Reviewed: 03/05/2017 Elsevier Patient Education  [place].   Duloxetine delayed-release capsules What is this medicine? DULOXETINE (doo LOX e teen) is used to treat depression, anxiety, and different types of chronic pain. This medicine may be used for other purposes; ask your health care provider or pharmacist if you have questions. COMMON BRAND NAME(S): Cymbalta, Creig Hines, Irenka What should I tell my health care provider before I take this medicine? They need to know if you have any of these conditions:  bipolar disorder  glaucoma  high blood pressure  kidney disease  liver disease  seizures  suicidal thoughts, plans or attempt; a previous suicide attempt by you or a family member  take medicines that treat or prevent blood clots  taken medicines called MAOIs like Carbex, Eldepryl, Marplan, Nardil, and Parnate within 14 days  trouble passing urine  an unusual reaction to duloxetine, other medicines, foods, dyes, or preservatives  pregnant or trying to get pregnant  breast-feeding How should I use this medicine? Take this medicine by mouth with a glass of water. Follow the directions on the prescription label. Do not crush, cut or chew some capsules of this medicine. Some capsules may be opened and sprinkled on applesauce. Check with your doctor or pharmacist if you are not sure. You can take this medicine with or without food. Take your medicine at regular intervals. Do not take your medicine more often than directed. Do not stop taking this medicine suddenly except upon the advice of your doctor. Stopping this medicine too quickly may cause serious side effects or your condition may worsen. A special MedGuide will be given to you by  the pharmacist with each prescription and refill. Be sure to read this information carefully each time. Talk to your pediatrician regarding the use of this medicine in children. While this drug may be prescribed for children as young as 47 years of age for selected conditions, precautions do apply. Overdosage: If you think you have taken too much of this medicine contact a poison control center or emergency room at once. NOTE: This medicine is only for you. Do not share this medicine with others. What if I miss a dose? If you miss a dose, take it as soon as you can. If it is almost time for your next dose, take only that dose. Do not take double or extra doses. What may interact with this medicine? Do not take this medicine with any of the following medications:  desvenlafaxine  levomilnacipran  linezolid  MAOIs like Carbex, Eldepryl, Marplan, Nardil, and Parnate  methylene blue (injected into a vein)  milnacipran  thioridazine  venlafaxine This medicine may also interact with the following medications:  alcohol  amphetamines  aspirin and aspirin-like medicines  certain antibiotics like ciprofloxacin and enoxacin  certain medicines for blood pressure, heart disease, irregular heart beat  certain medicines for depression, anxiety, or psychotic disturbances  certain medicines for migraine headache like almotriptan, eletriptan, frovatriptan, naratriptan, rizatriptan, sumatriptan, zolmitriptan  certain medicines that treat or prevent blood clots like warfarin, enoxaparin, and dalteparin  cimetidine  fentanyl  lithium  NSAIDS, medicines for pain and inflammation, like ibuprofen or naproxen  phentermine  procarbazine  rasagiline  sibutramine  St. John's wort  theophylline  tramadol  tryptophan This list may not describe all possible interactions. Give your health care provider a list of all the medicines, herbs, non-prescription drugs, or dietary supplements  you use. Also tell them if you smoke, drink alcohol, or use illegal drugs. Some items may interact with your medicine. What should I watch for while using this medicine? Tell your doctor if your symptoms do not get better or if they get worse. Visit your doctor or healthcare provider for regular checks on your progress. Because it may take several weeks to see the full effects of this medicine, it is important to continue your treatment as prescribed by your doctor. This medicine may cause serious skin reactions. They can happen weeks to months after starting the medicine. Contact your healthcare provider right away if you notice fevers or flu-like symptoms with a rash. The rash may be red or purple and then turn into blisters or peeling of the skin. Or, you might notice a red rash with swelling of the face, lips, or lymph nodes in your neck or under your arms. Patients  and their families should watch out for new or worsening thoughts of suicide or depression. Also watch out for sudden changes in feelings such as feeling anxious, agitated, panicky, irritable, hostile, aggressive, impulsive, severely restless, overly excited and hyperactive, or not being able to sleep. If this happens, especially at the beginning of treatment or after a change in dose, call your healthcare provider. You may get drowsy or dizzy. Do not drive, use machinery, or do anything that needs mental alertness until you know how this medicine affects you. Do not stand or sit up quickly, especially if you are an older patient. This reduces the risk of dizzy or fainting spells. Alcohol may interfere with the effect of this medicine. Avoid alcoholic drinks. This medicine can cause an increase in blood pressure. This medicine can also cause a sudden drop in your blood pressure, which may make you feel faint and increase the chance of a fall. These effects are most common when you first start the medicine or when the dose is increased, or  during use of other medicines that can cause a sudden drop in blood pressure. Check with your doctor for instructions on monitoring your blood pressure while taking this medicine. Your mouth may get dry. Chewing sugarless gum or sucking hard candy, and drinking plenty of water, may help. Contact your doctor if the problem does not go away or is severe. What side effects may I notice from receiving this medicine? Side effects that you should report to your doctor or health care professional as soon as possible:  allergic reactions like skin rash, itching or hives, swelling of the face, lips, or tongue  anxious  breathing problems  confusion  changes in vision  chest pain  confusion  elevated mood, decreased need for sleep, racing thoughts, impulsive behavior  eye pain  fast, irregular heartbeat  feeling faint or lightheaded, falls  feeling agitated, angry, or irritable  hallucination, loss of contact with reality  high blood pressure  loss of balance or coordination  palpitations  redness, blistering, peeling or loosening of the skin, including inside the mouth  restlessness, pacing, inability to keep still  seizures  stiff muscles  suicidal thoughts or other mood changes  trouble passing urine or change in the amount of urine  trouble sleeping  unusual bleeding or bruising  unusually weak or tired  vomiting  yellowing of the eyes or skin Side effects that usually do not require medical attention (report to your doctor or health care professional if they continue or are bothersome):  change in sex drive or performance  change in appetite or weight  constipation  dizziness  dry mouth  headache  increased sweating  nausea  tired This list may not describe all possible side effects. Call your doctor for medical advice about side effects. You may report side effects to FDA at 1-800-FDA-1088. Where should I keep my medicine? Keep out of the  reach of children. Store at room temperature between 15 and 30 degrees C (59 to 86 degrees F). Throw away any unused medicine after the expiration date. NOTE: This sheet is a summary. It may not cover all possible information. If you have questions about this medicine, talk to your doctor, pharmacist, or health care provider.  2020 Elsevier/Gold Standard (2018-06-17 13:47:50)

## 2018-12-27 NOTE — Progress Notes (Signed)
Subjective:     Patient ID: Nancy Lambert , female    DOB: [birth year] , 78 y.o.   MRN: [id number]   Chief Complaint  Patient presents with  . Pain    fall    HPI  Fall The accident occurred more than 1 week ago. The fall occurred while walking. She landed on hard floor. There was no blood loss. The point of impact was the right knee. The pain is present in the right knee and back. The pain is at a severity of 4/10. The pain is moderate. The symptoms are aggravated by ambulation. Pertinent negatives include no bowel incontinence, fever, numbness or tingling. She has tried elevation and ice for the symptoms. The treatment provided mild relief.     Past Medical History:  Diagnosis Date  . Anxiety   . Cardiac arrhythmia   . Glaucoma   . Hypertension   . 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 Scleroderma ([place])      Family History  Problem Relation Age of Onset  . Breast cancer Mother   . Clotting disorder Mother   . Diabetes Mother   . Breast cancer Sister   . Clotting disorder Sister   . Prostate cancer Son   . Breast cancer Daughter   . Other Father        drowned  . Breast cancer Sister   . Breast cancer Other   . Colon cancer Neg Hx      Current Outpatient Medications:  .  aspirin EC 81 MG tablet, Take 81 mg by mouth daily., Disp: , Rfl:  .  omeprazole (PRILOSEC) 40 MG capsule, TAKE 1 CAPSULE BY MOUTH ONCE DAILY. TAKE 30 MINUTES BEFORE FIRST MEAL OF THE DAY, Disp: 90 capsule, Rfl: 3 .  Riociguat (ADEMPAS) 2 MG TABS, Take 1 tablet by mouth 3 (three) times daily. , Disp: , Rfl:  .  DULoxetine (CYMBALTA) 20 MG capsule, Take 1 capsule (20 mg total) by mouth daily., Disp: 30 capsule, Rfl: 1 .  gabapentin (NEURONTIN) 300 MG capsule, Take 1 capsule (300 mg total) by mouth 3 (three) times daily. (Patient not taking: Reported on 12/22/2018), Disp: 90 capsule, Rfl: 2   No Known Allergies   Review of Systems  Constitutional: Negative.  Negative for fever.   Respiratory: Negative.   Cardiovascular: Negative.   Gastrointestinal: Negative.  Negative for bowel incontinence.  Musculoskeletal: Positive for arthralgias and back pain.  Neurological: Negative.  Negative for tingling and numbness.  Psychiatric/Behavioral: Negative.      Today's Vitals   12/22/18 1550  BP: 140/78  Pulse: 77  Temp: 98.3 F (36.8 C)  TempSrc: Oral  SpO2: 97%  Weight: 138 lb 9.6 oz (62.9 kg)  Height: 5' 2.2" (1.58 m)   Body mass index is 25.19 kg/m.   Objective:  Physical Exam Vitals signs and nursing note reviewed.  Constitutional:      Appearance: Normal appearance. She is obese.  HENT:     Head: Normocephalic and atraumatic.  Cardiovascular:     Rate and Rhythm: Normal rate and regular rhythm.     Heart sounds: Normal heart sounds.  Pulmonary:     Effort: Pulmonary effort is normal.     Breath sounds: Normal breath sounds.  Musculoskeletal:     Comments: Right knee swelling. No overlying erythema.   Skin:    General: Skin is warm.  Neurological:     General: No focal deficit present.     Mental Status: She  is alert.  Psychiatric:        Mood and Affect: Mood normal.        Behavior: Behavior normal.         Assessment And Plan:     1. Fall, initial encounter  I would like to refer her for physical therapy; however, she declines. She is encouraged to use assistive devices if needed when walking into her home.   2. Lumbar radiculopathy  Chronic. I think she will benefit from duloxetine. She feels gabapentin is not effective. I will start her on duloxetine 20mg , possible side effects were discussed with the patient. She is advised to take with evening meal. She will rto in four weeks for re-evaluation.   - Ambulatory referral to Sports Medicine  3. Recurrent pain of right knee  Chronic. She may benefit from topical Voltaren gel. I will refer her to Sports Medicine for radiographic studies and further evaluation.   - Ambulatory  referral to Sports Medicine  4. Need for vaccination  She declined flu vaccine.    Maximino Greenland, MD    THE PATIENT IS ENCOURAGED TO PRACTICE SOCIAL DISTANCING DUE TO THE COVID-19 PANDEMIC.

## 2019-01-10 ENCOUNTER — Ambulatory Visit: Payer: Medicare Other | Admitting: Nurse Practitioner

## 2019-01-11 ENCOUNTER — Encounter: Payer: Self-pay | Admitting: Internal Medicine

## 2019-01-11 ENCOUNTER — Other Ambulatory Visit: Payer: Self-pay

## 2019-01-11 ENCOUNTER — Ambulatory Visit (INDEPENDENT_AMBULATORY_CARE_PROVIDER_SITE_OTHER): Payer: Medicare Other | Admitting: Internal Medicine

## 2019-01-11 VITALS — BP 110/62 | HR 86 | Temp 98.4°F | Ht 63.2 in | Wt 134.6 lb

## 2019-01-11 DIAGNOSIS — Z79899 Other long term (current) drug therapy: Secondary | ICD-10-CM | POA: Diagnosis not present

## 2019-01-11 DIAGNOSIS — R030 Elevated blood-pressure reading, without diagnosis of hypertension: Secondary | ICD-10-CM | POA: Diagnosis not present

## 2019-01-11 DIAGNOSIS — M5416 Radiculopathy, lumbar region: Secondary | ICD-10-CM | POA: Diagnosis not present

## 2019-01-11 DIAGNOSIS — M349 Systemic sclerosis, unspecified: Secondary | ICD-10-CM

## 2019-01-11 MED ORDER — DULOXETINE HCL 30 MG PO CPEP: 30.0000 mg | ORAL_CAPSULE | Freq: Every day | ORAL | 2 refills | Status: DC

## 2019-01-11 NOTE — Patient Instructions (Signed)

## 2019-01-12 LAB — CMP14+EGFR
ALT: 11 IU/L (ref 0–32)
AST: 15 IU/L (ref 0–40)
Albumin/Globulin Ratio: 2 (ref 1.2–2.2)
Albumin: 4.5 g/dL (ref 3.7–4.7)
Alkaline Phosphatase: 51 IU/L (ref 39–117)
BUN/Creatinine Ratio: 13 (ref 12–28)
BUN: 9 mg/dL (ref 8–27)
Bilirubin Total: 0.6 mg/dL (ref 0.0–1.2)
CO2: 26 mmol/L (ref 20–29)
Calcium: 10.2 mg/dL (ref 8.7–10.3)
Chloride: 103 mmol/L (ref 96–106)
Creatinine, Ser: 0.7 mg/dL (ref 0.57–1.00)
GFR calc Af Amer: 97 mL/min/{1.73_m2} (ref >59)
GFR calc non Af Amer: 84 mL/min/{1.73_m2} (ref >59)
Globulin, Total: 2.2 g/dL (ref 1.5–4.5)
Glucose: 76 mg/dL (ref 65–99)
Potassium: 4.4 mmol/L (ref 3.5–5.2)
Sodium: 142 mmol/L (ref 134–144)
Total Protein: 6.7 g/dL (ref 6.0–8.5)

## 2019-01-12 LAB — CBC
Hematocrit: 40.1 % (ref 34.0–46.6)
Hemoglobin: 13 g/dL (ref 11.1–15.9)
MCH: 30.4 pg (ref 26.6–33.0)
MCHC: 32.4 g/dL (ref 31.5–35.7)
MCV: 94 fL (ref 79–97)
Platelets: 293 10*3/uL (ref 150–450)
RBC: 4.28 x10E6/uL (ref 3.77–5.28)
RDW: 13 % (ref 11.7–15.4)
WBC: 3.8 10*3/uL (ref 3.4–10.8)

## 2019-01-12 LAB — VITAMIN B12: Vitamin B-12: 340 pg/mL (ref 232–1245)

## 2019-01-14 ENCOUNTER — Telehealth: Payer: Self-pay

## 2019-01-14 NOTE — Telephone Encounter (Signed)
I was calling the pt with her lab results.  I was unable to leave a message because her voicemail is full.

## 2019-01-14 NOTE — Telephone Encounter (Signed)
-----   Message from Glendale Chard, MD sent at 01/12/2019  7:39 PM EDT ----- Here are your lab results:  Your vitamin B12 level is within normal limits.  Your blood count, liver and kidney function are normal.  Please let me know if you have any questions.   Sincerely,    Robyn N. [facility], MD

## 2019-01-15 ENCOUNTER — Encounter: Payer: Self-pay | Admitting: Internal Medicine

## 2019-01-18 ENCOUNTER — Ambulatory Visit: Payer: Self-pay | Admitting: Internal Medicine

## 2019-01-18 LAB — METHYLMALONIC ACID, SERUM: Methylmalonic Acid: 305 nmol/L (ref 0–378)

## 2019-01-18 LAB — SPECIMEN STATUS REPORT

## 2019-01-25 ENCOUNTER — Other Ambulatory Visit: Payer: Medicare Other

## 2019-01-25 ENCOUNTER — Other Ambulatory Visit: Payer: Self-pay | Admitting: Internal Medicine

## 2019-01-25 DIAGNOSIS — Z1231 Encounter for screening mammogram for malignant neoplasm of breast: Secondary | ICD-10-CM

## 2019-01-25 NOTE — Progress Notes (Signed)
Subjective:     Patient ID: Nancy Lambert , female    DOB: 1940/08/21 , 78 y.o.   MRN: [id number]   Chief Complaint  Patient presents with  . Hypertension    HPI  She is here today for f/u elevated blood pressure.  Her BP was elevated at last visit. She denies headaches, chest pain and shortness of breath. She has cut back on her salt intake.     Past Medical History:  Diagnosis Date  . Anxiety   . Cardiac arrhythmia   . Glaucoma   . Hypertension   . 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 Scleroderma ([place])      Family History  Problem Relation Age of Onset  . Breast cancer Mother   . Clotting disorder Mother   . Diabetes Mother   . Breast cancer Sister   . Clotting disorder Sister   . Prostate cancer Son   . Breast cancer Daughter   . Other Father        drowned  . Breast cancer Sister   . Breast cancer Other   . Colon cancer Neg Hx      Current Outpatient Medications:  .  aspirin EC 81 MG tablet, Take 81 mg by mouth daily., Disp: , Rfl:  .  DULoxetine (CYMBALTA) 30 MG capsule, Take 1 capsule (30 mg total) by mouth daily., Disp: 30 capsule, Rfl: 2 .  gabapentin (NEURONTIN) 300 MG capsule, Take 1 capsule (300 mg total) by mouth 3 (three) times daily. (Patient not taking: Reported on 12/22/2018), Disp: 90 capsule, Rfl: 2 .  omeprazole (PRILOSEC) 40 MG capsule, TAKE 1 CAPSULE BY MOUTH ONCE DAILY. TAKE 30 MINUTES BEFORE FIRST MEAL OF THE DAY, Disp: 90 capsule, Rfl: 3 .  Riociguat (ADEMPAS) 2 MG TABS, Take 1 tablet by mouth 3 (three) times daily. , Disp: , Rfl:    No Known Allergies   Review of Systems  Constitutional: Negative.   Respiratory: Negative.   Cardiovascular: Negative.   Gastrointestinal: Negative.   Musculoskeletal: Positive for back pain.       She has chronic LBP. She has been taking duloxetine, she thinks it has helped some.   Neurological: Negative.   Psychiatric/Behavioral: Negative.      Today's Vitals   01/11/19 1151  BP: 110/62   Pulse: 86  Temp: 98.4 F (36.9 C)  TempSrc: Oral  SpO2: 96%  Weight: 134 lb 9.6 oz (61.1 kg)  Height: 5' 3.2" (1.605 m)   Body mass index is 23.69 kg/m.   Objective:  Physical Exam Vitals signs and nursing note reviewed.  Constitutional:      Appearance: Normal appearance.  HENT:     Head: Normocephalic and atraumatic.  Cardiovascular:     Rate and Rhythm: Normal rate and regular rhythm.     Heart sounds: Normal heart sounds.  Pulmonary:     Effort: Pulmonary effort is normal.     Breath sounds: Normal breath sounds.  Skin:    General: Skin is warm.  Neurological:     General: No focal deficit present.     Mental Status: She is alert.  Psychiatric:        Mood and Affect: Mood normal.        Behavior: Behavior normal.         Assessment And Plan:     1. Elevated blood pressure reading  This has resolved. I will check labs as listed below. She is reminded to avoid adding  salt to her foods and to avoid packaged foods which tend to be high in sodium.   - CBC no Diff - CMP14+EGFR  2. Lumbar radiculopathy  Chronic. She has noticed some improvement with duloxetine. I will increase her dose to 30 mg nightly. She is reminded to stay well hydrated. She will rto in six to eight weeks for re-evaluation.   3. 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   4. Drug therapy  I will check vitamin B12 level today. Pt is agreeable to IM supplementation if needed.  - Vitamin B12        Maximino Greenland, MD    THE PATIENT IS ENCOURAGED TO PRACTICE SOCIAL DISTANCING DUE TO THE COVID-19 PANDEMIC.

## 2019-01-30 DIAGNOSIS — Z9889 Other specified postprocedural states: Secondary | ICD-10-CM

## 2019-01-30 HISTORY — DX: Other specified postprocedural states: Z98.890

## 2019-01-31 ENCOUNTER — Other Ambulatory Visit: Payer: Medicare Other

## 2019-02-01 ENCOUNTER — Telehealth: Payer: Self-pay

## 2019-02-01 NOTE — Telephone Encounter (Signed)
Voicemail full/unable to leave a message.  I was returning the pt's call because she left a message that her pain has stopped some and that she would like to go ahead with the increase in her med.  I was calling the pt back to let her know that the increase dose of cymbalta was sent to her pharmacy the day of her visit and to check with her pharmacy.

## 2019-02-07 ENCOUNTER — Other Ambulatory Visit: Payer: Self-pay

## 2019-02-07 ENCOUNTER — Ambulatory Visit (INDEPENDENT_AMBULATORY_CARE_PROVIDER_SITE_OTHER): Payer: Medicare Other

## 2019-02-07 DIAGNOSIS — R0602 Shortness of breath: Secondary | ICD-10-CM

## 2019-02-07 DIAGNOSIS — I272 Pulmonary hypertension, unspecified: Secondary | ICD-10-CM | POA: Diagnosis not present

## 2019-02-09 ENCOUNTER — Encounter: Payer: Self-pay | Admitting: Internal Medicine

## 2019-02-09 ENCOUNTER — Telehealth: Payer: Self-pay | Admitting: Neurology

## 2019-02-09 NOTE — Telephone Encounter (Signed)
Called and left a message with patient's daughter and relayed [place] [facility] has been trying to get patient scheduled with no response. I called and left daughter a message.

## 2019-02-22 ENCOUNTER — Encounter: Payer: Self-pay | Admitting: Internal Medicine

## 2019-02-22 ENCOUNTER — Other Ambulatory Visit: Payer: Self-pay

## 2019-02-22 ENCOUNTER — Ambulatory Visit (INDEPENDENT_AMBULATORY_CARE_PROVIDER_SITE_OTHER): Payer: Medicare Other | Admitting: Internal Medicine

## 2019-02-22 VITALS — BP 132/78 | HR 70 | Temp 98.5°F | Ht 63.2 in | Wt 132.8 lb

## 2019-02-22 DIAGNOSIS — M5416 Radiculopathy, lumbar region: Secondary | ICD-10-CM

## 2019-02-22 DIAGNOSIS — I272 Pulmonary hypertension, unspecified: Secondary | ICD-10-CM | POA: Diagnosis not present

## 2019-02-25 NOTE — Progress Notes (Signed)
Subjective:     Patient ID: Nancy Lambert , female    DOB: 1940-04-22 , 78 y.o.   MRN: [id number]   Chief Complaint  Patient presents with  . Cymbalta f/u    HPI  She is here today for f/u duloxetine/Cymbalta. She was started initially on 20mg , and then increased to 30mg  at her last visit.  This was started to treat chronic pain associated with lumbar radiculopathy.  She reports having less pain with this dose. She has not had any side effects from this medication. She would like to continue on this medication.     Past Medical History:  Diagnosis Date  . Anxiety   . Cardiac arrhythmia   . Glaucoma   . Hypertension   . 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[place])   . Scleroderma ([place])      Family History  Problem Relation Age of Onset  . Breast cancer Mother   . Clotting disorder Mother   . Diabetes Mother   . Breast cancer Sister   . Clotting disorder Sister   . Prostate cancer Son   . Breast cancer Daughter   . Other Father        drowned  . Breast cancer Sister   . Breast cancer Other   . Colon cancer Neg Hx      Current Outpatient Medications:  .  aspirin EC 81 MG tablet, Take 81 mg by mouth daily., Disp: , Rfl:  .  DULoxetine (CYMBALTA) 30 MG capsule, Take 1 capsule (30 mg total) by mouth daily., Disp: 30 capsule, Rfl: 2 .  Ibuprofen (ADVIL PO), Take by mouth. prn, Disp: , Rfl:  .  omeprazole (PRILOSEC) 40 MG capsule, TAKE 1 CAPSULE BY MOUTH ONCE DAILY. TAKE 30 MINUTES BEFORE FIRST MEAL OF THE DAY, Disp: 90 capsule, Rfl: 3 .  Riociguat (ADEMPAS) 2 MG TABS, Take 1 tablet by mouth 3 (three) times daily. , Disp: , Rfl:    No Known Allergies   Review of Systems  Constitutional: Negative.   Respiratory: Negative.   Cardiovascular: Negative.   Gastrointestinal: Negative.   Neurological: Negative.   Psychiatric/Behavioral: Negative.      Today's Vitals   02/22/19 1203  BP: 132/78  Pulse: 70  Temp: 98.5 F (36.9 C)  TempSrc: Oral  Weight: 132 lb 12.8 oz  (60.2 kg)  Height: 5' 3.2" (1.605 m)  PainSc: 6   PainLoc: Hip   Body mass index is 23.38 kg/m.   Objective:  Physical Exam Vitals signs and nursing note reviewed.  Constitutional:      Appearance: Normal appearance.  HENT:     Head: Normocephalic and atraumatic.  Cardiovascular:     Rate and Rhythm: Normal rate and regular rhythm.     Heart sounds: Normal heart sounds.  Pulmonary:     Effort: Pulmonary effort is normal.     Breath sounds: Normal breath sounds.  Skin:    General: Skin is warm.  Neurological:     General: No focal deficit present.     Mental Status: She is alert.  Psychiatric:        Mood and Affect: Mood normal.        Behavior: Behavior normal.         Assessment And Plan:     1. Lumbar radiculopathy  Chronic. Sx improved with use of duloxetine 30mg  daily. She was given 90 day refill. She will rto in 3-4 months for re-evaluation.   2. Pulmonary hypertension (HCC)  Chronic.  She is also followed by Cardiology. She is encouraged to take meds daily.   Maximino Greenland, MD    THE PATIENT IS ENCOURAGED TO PRACTICE SOCIAL DISTANCING DUE TO THE COVID-19 PANDEMIC.

## 2019-03-03 NOTE — Progress Notes (Signed)
Primary Physician/Referring:  Glendale Chard, MD  Patient ID: Nancy Lambert, female    DOB: Aug 29, 1940, 78 y.o.   MRN: [id number]  Chief Complaint  Patient presents with  . Hypertension  . Shortness of Breath  . Follow-up    3 month  . Results    echo    HPI: Nancy Lambert  is a 78 y.o. AA female  [place]  primary [place], hyperlipidemia, essential hypertension,  Chronic palpitations suggestive of PVC, H/O Raynaud's disease, sclerodactyly, and scleroderma. She has been started on Adempas for pulm HTN and has chronic dyspnea, lumbar radiculopathy.   She continues to have occasional palpitations related to her stress level. Occasional chest pain that resolves with changing positions.  She does continue to have difficulty swallowing pills even with food due to dry mouth and follows [facility].     She now presents for 2 month OV. Dyspnea is stable. Leg pain and hip pain has improved after steroid injection and also on Cymbalta.   Past Medical History:  Diagnosis Date  . Anxiety   . Cardiac arrhythmia   . Glaucoma   . Hypertension   . 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[place])   . Scleroderma [facility])     Past Surgical History:  Procedure Laterality Date  . CARDIAC CATHETERIZATION N/A 07/24/2015   Procedure: Right Heart Cath;  Surgeon: Adrian Prows, MD;  Location: [facility];  Service: Cardiovascular;  Laterality: N/A;  . steroid shots  2020   for pain  . VESICOVAGINAL FISTULA CLOSURE W/ TAH      Social History   Socioeconomic History  . Marital status: Widowed    Spouse name: Not on file  . Number of children: 4  . Years of education: Not on file  . Highest education level: Not on file  Occupational History  . Occupation: Retired  Scientific laboratory technician  . Financial resource strain: Hard  . Food insecurity    Worry: Sometimes true    Inability: Sometimes true  . Transportation needs    Medical: No    Non-medical: No  Tobacco Use  . Smoking status: Former Smoker    Types:  Cigarettes    Quit date: 1971    Years since quitting: 49.9  . Smokeless tobacco: Never Used  . Tobacco comment: very light use when she did smoke  Substance and Sexual Activity  . Alcohol use: Yes    Alcohol/week: 0.0 standard drinks    Comment: occasional  . Drug use: No  . Sexual activity: Yes  Lifestyle  . Physical activity    Days per week: 2 days    Minutes per session: 20 min  . Stress: Not at all  Relationships  . Social Herbalist on phone: Not on file    Gets together: Not on file    Attends religious service: Not on file    Active member of club or organization: Not on file    Attends meetings of clubs or organizations: Not on file    Relationship status: Not on file  . Intimate partner violence    Fear of current or ex partner: No    Emotionally abused: No    Physically abused: No    Forced sexual activity: No  Other Topics Concern  . Not on file  Social History Narrative   Lives at home with her daughter   Right handed   Review of Systems  Constitution: Negative for decreased appetite and malaise/fatigue.  Cardiovascular: Positive for dyspnea on exertion (stable) and palpitations. Negative for chest pain, leg swelling (improved since starting therapy), orthopnea and syncope.  Respiratory: Positive for shortness of breath. Negative for wheezing.   Endocrine: Negative for cold intolerance and heat intolerance.  Hematologic/Lymphatic: Does not bruise/bleed easily.  Musculoskeletal: Positive for back pain, joint pain (hands and left hip) and joint swelling (right knee).  Gastrointestinal: Positive for dysphagia (chronic) and heartburn (stable). Negative for diarrhea, nausea and vomiting.  Neurological: Negative for dizziness.  All other systems reviewed and are negative.  Objective   Vitals with BMI 03/04/2019 02/22/2019 01/11/2019  Height 5\' 3"  5' 3.2" 5' 3.2"  Weight 133 lbs 5 oz 132 lbs 13 oz 134 lbs 10 oz  BMI 23.62 123XX123 0000000  Systolic 123XX123  [id number]  Diastolic 83 78 62  Pulse 99 70 86    Physical Exam  Constitutional: She appears well-developed and well-nourished.  HENT:  Head: Normocephalic and atraumatic.  Neck: Normal range of motion. Carotid bruit is not present. No thyromegaly present.  Cardiovascular: Normal rate, regular rhythm and S1 normal.  Murmur heard.  Systolic murmur is present with a grade of 3/6 at the upper right sternal border and lower left sternal border. Pulses:      Carotid pulses are 2+ on the right side and 2+ on the left side.      Femoral pulses are 2+ on the right side and 2+ on the left side.      Popliteal pulses are 2+ on the right side and 2+ on the left side.       Dorsalis pedis pulses are 1+ on the right side and 1+ on the left side.       Posterior tibial pulses are 0 on the right side and 0 on the left side.  Increased S2  Pulmonary/Chest: Effort normal.  Abdominal: Soft. She exhibits no ascites. There is hepatomegaly.  Musculoskeletal: Normal range of motion.  Neurological: She is alert.  Skin: Skin is warm, dry and intact.  Abnormally dry due to scleroderma  Vitals reviewed.  Radiology: No results found.  Laboratory examination:    CMP Latest Ref Rng & Units 01/11/2019 02/10/2018 12/30/2008  Glucose 65 - 99 mg/dL 76 92 153(H)  BUN 8 - 27 mg/dL 9 10 11   Creatinine 0.57 - 1.00 mg/dL 0.70 0.85 0.63  Sodium 134 - 144 mmol/L 142 142 140  Potassium 3.5 - 5.2 mmol/L 4.4 3.4(L) 3.1(L)  Chloride 96 - 106 mmol/L 103 108 105  CO2 20 - 29 mmol/L 26 26 24   Calcium 8.7 - 10.3 mg/dL 10.2 9.5 9.4  Total Protein 6.0 - 8.5 g/dL 6.7 - 7.1  Total Bilirubin 0.0 - 1.2 mg/dL 0.6 - 1.0  Alkaline Phos 39 - 117 IU/L 51 - 54  AST 0 - 40 IU/L 15 - 28  ALT 0 - 32 IU/L 11 - 11   CBC Latest Ref Rng & Units 01/11/2019 02/10/2018 12/30/2008  WBC 3.4 - 10.8 x10E3/uL 3.8 4.1 13.0(H)  Hemoglobin 11.1 - 15.9 g/dL 13.0 12.4 12.8  Hematocrit 34.0 - 46.6 % 40.1 40.7 38.9  Platelets 150 - 450 x10E3/uL 293  291 360   Lipid Panel  No results found for: CHOL, TRIG, HDL, CHOLHDL, VLDL, LDLCALC, LDLDIRECT HEMOGLOBIN A1C No results found for: HGBA1C, MPG TSH No results for input(s): TSH in the last 8760 hours.  PRN Meds:. There are no discontinued medications. Current Meds  Medication Sig  . aspirin EC 81 MG tablet  Take 81 mg by mouth daily.  . DULoxetine (CYMBALTA) 30 MG capsule Take 1 capsule (30 mg total) by mouth daily.  Marland Kitchen omeprazole (PRILOSEC) 40 MG capsule TAKE 1 CAPSULE BY MOUTH ONCE DAILY. TAKE 30 MINUTES BEFORE FIRST MEAL OF THE DAY  . Riociguat (ADEMPAS) 2 MG TABS Take 1 tablet by mouth 3 (three) times daily.     Cardiac Studies:   Lexiscan myoview stress test 06/18/2015: 1. Resting EKG demonstrates normal sinus rhythm, left axis deviation, cannot exclude inferior infarct old, Anterior infarct old.  Stress EKG was negative for myocardial ischemia.  Patient exercised for 4 minutes and 5 seconds and achieved 5.93 Mets.  There are frequent PVCs in the recovery phase of the stress test.  Stress terminated due to achieving target heart rate, 86% of MPHR.  Stress symptoms included dyspnea. 2. Myocardial perfusion imaging is normal. Overall left ventricular systolic function was normal without regional wall motion abnormalities. The left ventricular ejection fraction was 62%.  Right heart cath 07/24/2015: Right Heart Pressure:  RA A Wave  13 mmHg,   RA V Wave  9 mmHg,   RA Mean  8 mmHg  RV Systolic Pressure  48 mmHg,   RV Diastolic Pressure  4 mmHg, RV EDP  11 mmHg  PA Systolic Pressure  48 mmHg,   PA Diastolic Pressure  14 mmHg,   PA Mean  29 mmHg. PW A Wave  19 mmHg,   PW V Wave  17 mmHg,   PW Mean  15 mmHg  QP/QS  1    Total pulmonary vascular resistance 7 Wood units.   Impression: Mild to moderate pulmonary hypertension. Wedge pressure although normal, in the upper end of spectrum suggesting the pulmonary hypertension could also be related to mild LV diastolic heart failure. Findings  probably more consistent with primary pulmonary hypertension. Clinical correlation recommended.   Echocardiogram 02/07/2019: Mild concentric hypertrophy of the left ventricle. Left ventricle cavity is normal in size. Normal LV systolic function with EF 55%. Normal global wall motion. Doppler evidence of grade II (pseudonormal) diastolic dysfunction, elevated LAP.  Left atrial cavity is moderately dilated. Trileaflet aortic valve with mild aortic valve leaflet thickening. Increased LVOT velocity with only trace valvular aortic stenosis. Mean PG 8 mmHg.  Mild to moderate mitral regurgitation. Mild to moderate tricuspid regurgitation. Estimated pulmonary artery systolic pressure is 35 mmHg. Small to moderate, predominantly posteriorly located pericardial effusion. No RV collapse or any other evidence of hemodynamic compromise.  IVC is dilated with respiratory variation. Estimated RA pressure 8 mmHg. No significant change compared to previous study on 08/27/2018. ____________________________________________________ SIX MIN WALK 03/04/2019 12/13/2018 12/09/2018 08/11/2018 05/04/2018 08/18/2016 05/28/2015  Medications - - - - - ASA 81 tab and Prilosec 20mg  at 6:30am -  Supplimental Oxygen during Test? (L/min) No No No No No No No  Laps 14 12 12  253.59 19 7 -  Partial Lap (in Meters) 280 234.1 - - 15.24 0 -  Baseline BP (sitting) - - - - - 102/62 -  Baseline Heartrate 73 66 66 78 - 65 -  Baseline Dyspnea (Borg Scale) - - - - - 1 -  Baseline Fatigue (Borg Scale) - - - - - 2 -  Baseline SPO2 100 100 - 99 96 100 -  BP (sitting) - - - - - 132/84 -  Heartrate 99 52 52 101 - 90 -  Dyspnea (Borg Scale) - - - - - 1 -  Fatigue (Borg Scale) - - - - -  2 -  SPO2 100 53 53 94 96 98 -  BP (sitting) - - - - - 108/70 -  Heartrate 84 73 66 89 - 59 -  SPO2 100 94 97 99 - 100 -  Stopped or Paused before Six Minutes No No No No No No -  Interpretation - Leg pain - - - Dizziness -  Distance Completed 756 642.1 - - 661.24  336 -  Tech Comments: - - (No Data) (No Data) - pt walked at a normal pace (per pt)- no desat at end of test, no breaks during test///amg  normal pace/no SOB//lmr  Provider Comments: - - - 253.59 meters (No Data) - -    Assessment   1. Pulmonary hypertension ([place])   2. Dyspnea on exertion   3. Palpitations   4. Primary hypertension    EKG 09/28/2018: Normal sinus rhythm at rate 78 bpm, left axis deviation, left and trifascicular block.  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  IVCD, LVH.  Abnormal EKG.  EKG 05/04/2018: Sinus bradycardia at the rate of 58 bpm with 1 PAC, left axis deviation, left anterior fasicular block. 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 Low voltage complexes. Abnormal EKG. No changes from EKG 01/25/2018  Recommendations:    Nancy Lambert  is a 78 y.o. AA female  [place]  primary [place], hyperlipidemia, essential hypertension,  Chronic palpitations suggestive of PVC, H/O Raynaud's disease, sclerodactyly, and scleroderma. She has been started on Adempas for pulm HTN and has chronic dyspnea, lumbar radiculopathy.   I reviewed the results of the recently performed echocardiogram, pulmonary hypertension is improved.  Her walking distance is also improved.  She is maintaining oxygenation.  Her blood pressure is elevated today, I have added amlodipine 10 mg daily.  This may also help with her Raynaud's disease as well.  I'd like to see her back in 3 months with repeat 6 minute walk test for follow-up of dyspnea and primary hypertension and pulmonary hypertension.  She has not had lipid profile testing her TSH in a while, I will obtain these.  Adrian Prows, MD, [facility] 03/04/2019, 12:13 PM [facility]. [place] Pager: [phone] Office: [phone] If no answer Cell [phone]

## 2019-03-04 ENCOUNTER — Ambulatory Visit (INDEPENDENT_AMBULATORY_CARE_PROVIDER_SITE_OTHER): Payer: Medicare Other | Admitting: Cardiology

## 2019-03-04 ENCOUNTER — Encounter: Payer: Self-pay | Admitting: Cardiology

## 2019-03-04 ENCOUNTER — Other Ambulatory Visit: Payer: Self-pay

## 2019-03-04 VITALS — BP 151/83 | HR 99 | Temp 97.7°F | Ht 63.0 in | Wt 133.3 lb

## 2019-03-04 DIAGNOSIS — I272 Pulmonary hypertension, unspecified: Secondary | ICD-10-CM

## 2019-03-04 DIAGNOSIS — R0600 Dyspnea, unspecified: Secondary | ICD-10-CM

## 2019-03-04 DIAGNOSIS — R0609 Other forms of dyspnea: Secondary | ICD-10-CM | POA: Diagnosis not present

## 2019-03-04 DIAGNOSIS — R002 Palpitations: Secondary | ICD-10-CM | POA: Diagnosis not present

## 2019-03-04 DIAGNOSIS — I10 Essential (primary) hypertension: Secondary | ICD-10-CM | POA: Diagnosis not present

## 2019-03-04 MED ORDER — AMLODIPINE BESYLATE 10 MG PO TABS: 10.0000 mg | ORAL_TABLET | Freq: Every day | ORAL | 2 refills | Status: DC

## 2019-03-07 ENCOUNTER — Telehealth: Payer: Self-pay

## 2019-03-07 NOTE — Telephone Encounter (Signed)
Yes. I am watching her BP and it has been stable and should be okay to use and see

## 2019-03-07 NOTE — Telephone Encounter (Signed)
Pt wants to make sure its ok for her to take the amlodipine with her adempas ?

## 2019-03-08 NOTE — Telephone Encounter (Signed)
Patient is aware 

## 2019-03-17 ENCOUNTER — Ambulatory Visit: Payer: Medicare Other

## 2019-03-18 ENCOUNTER — Other Ambulatory Visit: Payer: Self-pay

## 2019-03-18 ENCOUNTER — Ambulatory Visit
Admission: RE | Admit: 2019-03-18 | Discharge: 2019-03-18 | Disposition: A | Discharge status: Home / Self Care | Payer: Medicare Other | Source: Ambulatory Visit | Attending: Internal Medicine | Admitting: Internal Medicine

## 2019-03-18 DIAGNOSIS — Z1231 Encounter for screening mammogram for malignant neoplasm of breast: Secondary | ICD-10-CM

## 2019-03-22 ENCOUNTER — Other Ambulatory Visit: Payer: Self-pay | Admitting: Internal Medicine

## 2019-03-22 ENCOUNTER — Encounter: Payer: Self-pay | Admitting: Internal Medicine

## 2019-03-22 ENCOUNTER — Other Ambulatory Visit: Payer: Self-pay | Admitting: Cardiology

## 2019-03-22 DIAGNOSIS — E2839 Other primary ovarian failure: Secondary | ICD-10-CM

## 2019-03-28 ENCOUNTER — Ambulatory Visit: Payer: Medicare Other | Attending: Internal Medicine

## 2019-03-28 DIAGNOSIS — Z20822 Contact with and (suspected) exposure to covid-19: Secondary | ICD-10-CM

## 2019-03-30 ENCOUNTER — Emergency Department (HOSPITAL_COMMUNITY): Payer: Medicare Other

## 2019-03-30 ENCOUNTER — Encounter (HOSPITAL_COMMUNITY): Payer: Self-pay

## 2019-03-30 ENCOUNTER — Other Ambulatory Visit: Payer: Self-pay

## 2019-03-30 ENCOUNTER — Emergency Department (HOSPITAL_COMMUNITY)
Admission: EM | Admit: 2019-03-30 | Discharge: 2019-03-30 | Disposition: A | Discharge status: Home / Self Care | Payer: Medicare Other | Attending: Emergency Medicine | Admitting: Emergency Medicine

## 2019-03-30 ENCOUNTER — Telehealth: Payer: Self-pay

## 2019-03-30 DIAGNOSIS — U071 COVID-19: Secondary | ICD-10-CM

## 2019-03-30 DIAGNOSIS — Z79899 Other long term (current) drug therapy: Secondary | ICD-10-CM | POA: Insufficient documentation

## 2019-03-30 DIAGNOSIS — R0789 Other chest pain: Secondary | ICD-10-CM

## 2019-03-30 DIAGNOSIS — Z87891 Personal history of nicotine dependence: Secondary | ICD-10-CM | POA: Insufficient documentation

## 2019-03-30 DIAGNOSIS — Z7982 Long term (current) use of aspirin: Secondary | ICD-10-CM | POA: Insufficient documentation

## 2019-03-30 DIAGNOSIS — I10 Essential (primary) hypertension: Secondary | ICD-10-CM | POA: Insufficient documentation

## 2019-03-30 DIAGNOSIS — R079 Chest pain, unspecified: Secondary | ICD-10-CM | POA: Diagnosis present

## 2019-03-30 LAB — CBC
HCT: 40.1 % (ref 36.0–46.0)
Hemoglobin: 12.5 g/dL (ref 12.0–15.0)
MCH: 30.6 pg (ref 26.0–34.0)
MCHC: 31.2 g/dL (ref 30.0–36.0)
MCV: 98 fL (ref 80.0–100.0)
Platelets: 251 10*3/uL (ref 150–400)
RBC: 4.09 MIL/uL (ref 3.87–5.11)
RDW: 13.6 % (ref 11.5–15.5)
WBC: 3.5 10*3/uL — ABNORMAL LOW (ref 4.0–10.5)
nRBC: 0 % (ref 0.0–0.2)

## 2019-03-30 LAB — BASIC METABOLIC PANEL
Anion gap: 9 (ref 5–15)
BUN: 6 mg/dL — ABNORMAL LOW (ref 8–23)
CO2: 29 mmol/L (ref 22–32)
Calcium: 9.2 mg/dL (ref 8.9–10.3)
Chloride: 103 mmol/L (ref 98–111)
Creatinine, Ser: 0.58 mg/dL (ref 0.44–1.00)
GFR calc Af Amer: >60 mL/min (ref >60)
GFR calc non Af Amer: >60 mL/min (ref >60)
Glucose, Bld: 91 mg/dL (ref 70–99)
Potassium: 3.3 mmol/L — ABNORMAL LOW (ref 3.5–5.1)
Sodium: 141 mmol/L (ref 135–145)

## 2019-03-30 LAB — NOVEL CORONAVIRUS, NAA: SARS-CoV-2, NAA: DETECTED — AB

## 2019-03-30 LAB — TROPONIN I (HIGH SENSITIVITY): Troponin I (High Sensitivity): 15 ng/L (ref <18)

## 2019-03-30 NOTE — Telephone Encounter (Signed)
Pt called to inform the office that she tested positive for covid. I asked pt if she had symptoms and she stated that she didn't have anything to check her temp but she didn't feel warm. Pt stated that she has had a little tightness in her chest and it feels like a hole in her chest when she breathes in. I advised the pt to go to the er for her chest pain. Told pt that I would give her call tmrw morning.

## 2019-03-30 NOTE — ED Triage Notes (Signed)
Pt arrives POV for eval of CP, SOB and general malaise. Pt was tested at [facility] yesterday for Covid and resulted positive today. Pt in NARD in triage, reports she "feels as though there is a hole in her chest". Endorses hx of scleroderma

## 2019-03-30 NOTE — ED Provider Notes (Signed)
[facility] EMERGENCY DEPARTMENT Provider Note   CSN: [id number] Arrival date & time: 03/30/19  1551     History Chief Complaint  Patient presents with  . Chest Pain    Nancy Lambert is a 78 y.o. female.  The history is provided by the patient and medical records. No language interpreter was used.   Nancy Lambert is a 78 y.o. female who presents to the Emergency Department complaining of chest pain.  She feels like there is a small hole in her chest or a fresh wound in her chest.  She tested for COVID 19 on Monday due to positive exposure.  She received her positive result yesterday.  Denies fevers, cough, sob, leg swelling/pain, abdominal pain, nausea, vomiting, diarrhea.  No loss of taste/smell.  She sneezed twice today. Symptoms are mild and improving. She lives at home with her daughter.    Past Medical History:  Diagnosis Date  . Anxiety   . Cardiac arrhythmia   . Glaucoma   . Hypertension   . 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[place])   . Scleroderma [facility])     Patient Active Problem List   Diagnosis Date Noted  . Acute right-sided low back pain without sciatica 10/18/2018  . Right leg pain 09/07/2018  . Scleroderma ([place]) 05/04/2018  . Palpitations 05/04/2018  . Acute pain of right shoulder 04/07/2018  . 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 Pulmonary hypertension ([place]) 07/23/2015  . Dyspnea on exertion 05/28/2015    Past Surgical History:  Procedure Laterality Date  . CARDIAC CATHETERIZATION N/A 07/24/2015   Procedure: Right Heart Cath;  Surgeon: Adrian Prows, MD;  Location: [facility];  Service: Cardiovascular;  Laterality: N/A;  . steroid shots  2020   for pain  . VESICOVAGINAL FISTULA CLOSURE W/ TAH       OB History   No obstetric history on file.     Family History  Problem Relation Age of Onset  . Breast cancer Mother   . Clotting disorder Mother   . Diabetes Mother   . Breast cancer Sister    . Clotting disorder Sister   . Prostate cancer Son   . Breast cancer Daughter   . Other Father        drowned  . Breast cancer Sister   . Breast cancer Other   . Colon cancer Neg Hx     Social History   Tobacco Use  . Smoking status: Former Smoker    Types: Cigarettes    Quit date: 1971    Years since quitting: 50.0  . Smokeless tobacco: Never Used  . Tobacco comment: very light use when she did smoke  Substance Use Topics  . Alcohol use: Yes    Alcohol/week: 0.0 standard drinks    Comment: occasional  . Drug use: No    Home Medications Prior to Admission medications   Medication Sig Start Date End Date Taking? Authorizing Provider  amLODipine (NORVASC) 10 MG tablet Take 1 tablet (10 mg total) by mouth daily. 03/04/19 06/02/19  Adrian Prows, MD  aspirin EC 81 MG tablet Take 81 mg by mouth daily.    [provider]  DULoxetine (CYMBALTA) 30 MG capsule Take 1 capsule (30 mg total) by mouth daily. 01/11/19 01/11/20  Glendale Chard, MD  Ibuprofen (ADVIL PO) Take by mouth. prn    [provider]  omeprazole (PRILOSEC) 40 MG capsule TAKE 1 CAPSULE BY MOUTH ONCE DAILY. TAKE 30 MINUTES BEFORE  FIRST MEAL OF THE DAY 02/24/18   Mauri Pole, MD  Riociguat (ADEMPAS) 2 MG TABS Take 1 tablet by mouth 3 (three) times daily.     [provider]    Allergies    Patient has no known allergies.  Review of Systems   Review of Systems  All other systems reviewed and are negative.   Physical Exam Updated Vital Signs BP 128/90   Pulse 90   Temp 98.5 F (36.9 C) (Oral)   Resp 17   Ht 5\' 4"  (1.626 m)   Wt 59.9 kg   SpO2 99%   BMI 22.66 kg/m   Physical Exam Vitals and nursing note reviewed.  Constitutional:      Appearance: She is well-developed.  HENT:     Head: Normocephalic and atraumatic.  Cardiovascular:     Rate and Rhythm: Normal rate and regular rhythm.     Heart sounds: Murmur present.  Pulmonary:     Effort: Pulmonary effort is  normal. No respiratory distress.     Comments: Fine crackles in the lung bases bilaterally Abdominal:     Palpations: Abdomen is soft.     Tenderness: There is no abdominal tenderness. There is no guarding or rebound.  Musculoskeletal:        General: No tenderness.  Skin:    General: Skin is warm and dry.  Neurological:     Mental Status: She is alert and oriented to person, place, and time.  Psychiatric:     Comments: Appears anxious     ED Results / Procedures / Treatments   Labs (all labs ordered are listed, but only abnormal results are displayed) Labs Reviewed  BASIC METABOLIC PANEL - Abnormal; Notable for the following components:      Result Value   Potassium 3.3 (*)    BUN 6 (*)    All other components within normal limits  CBC - Abnormal; Notable for the following components:   WBC 3.5 (*)    All other components within normal limits  TROPONIN I (HIGH SENSITIVITY)    EKG EKG Interpretation  Date/Time:  Wednesday March 30 2019 16:09:02 EST Ventricular Rate:  97 PR Interval:  142 QRS Duration: 96 QT Interval:  360 QTC Calculation: 457 R Axis:   -69 Text Interpretation: Sinus rhythm with Premature atrial complexes Left anterior fascicular block Left ventricular hypertrophy with repolarization abnormality ( R in aVL , Cornell product ) Cannot rule out Septal infarct , age undetermined Possible Lateral infarct , age undetermined Abnormal ECG Confirmed by Quintella Reichert [phone]) on 03/30/2019 4:40:37 PM   Radiology DG Chest Port 1 View  Result Date: 03/30/2019 CLINICAL DATA:  COVID-19 positive EXAM: PORTABLE CHEST 1 VIEW COMPARISON:  02/10/2018 FINDINGS: There is mild right basilar atelectasis. There is no focal consolidation. There is no pleural effusion or pneumothorax. There is stable cardiomegaly. There is no acute osseous abnormality. IMPRESSION: No active disease. Electronically Signed   By: Kathreen Devoid   On: 03/30/2019 17:48    Procedures Procedures  (including critical care time)  Medications Ordered in ED Medications - No data to display  ED Course  I have reviewed the triage vital signs and the nursing notes.  Pertinent labs & imaging results that were available during my care of the patient were reviewed by me and considered in my medical decision making (see chart for details).    MDM Rules/Calculators/A&P  Patient with recent diagnosis of COVID-19 infection, has a hx/o scleroderma and pulmonary hypertension here for evaluation of chest pain. EKG without acute ischemic changes. Troponin is negative. Chest x-ray with no acute disease. Patient with no distress in the emergency department. She does have some anxiety. Presentation is not consistent with PE, ACS, bacterial infection. Discussed with patient home care for COVID-19 infection. Discussed outpatient follow-up and return precautions.  Nancy Lambert was evaluated in Emergency Department on 03/30/2019 for the symptoms described in the history of present illness. She was evaluated in the context of the global COVID-19 pandemic, which necessitated consideration that the patient might be at risk for infection with the SARS-CoV-2 virus that causes COVID-19. Institutional protocols and algorithms that pertain to the evaluation of patients at risk for COVID-19 are in a state of rapid change based on information released by regulatory bodies including the CDC and federal and state organizations. These policies and algorithms were followed during the patient's care in the ED.   Final Clinical Impression(s) / ED Diagnoses Final diagnoses:  Atypical chest pain  COVID-19 virus infection    Rx / DC Orders ED Discharge Orders    None       Quintella Reichert, MD 03/30/19 2341

## 2019-03-30 NOTE — ED Notes (Signed)
Patient verbalizes understanding of discharge instructions. Opportunity for questioning and answers were provided. Armband removed by staff, pt discharged from ED.  

## 2019-03-31 ENCOUNTER — Telehealth: Payer: Self-pay | Admitting: Nurse Practitioner

## 2019-03-31 NOTE — Telephone Encounter (Signed)
Called to Discuss with patient about Covid symptoms and the use of bamlanivimab, a monoclonal antibody infusion for those with mild to moderate Covid symptoms and at a high risk of hospitalization.     Pt is qualified for this infusion at the [facility] infusion center due to co-morbid conditions and/or a member of an at-risk group.     Patient states that symptoms started more than 10 days ago.

## 2019-04-13 ENCOUNTER — Other Ambulatory Visit: Payer: Self-pay

## 2019-04-19 ENCOUNTER — Other Ambulatory Visit: Payer: Self-pay

## 2019-04-19 MED ORDER — ADEMPAS 2 MG PO TABS: 1.0000 | ORAL_TABLET | Freq: Three times a day (TID) | ORAL | 0 refills | Status: DC

## 2019-04-19 MED ORDER — ADEMPAS 2 MG PO TABS: 1.0000 | ORAL_TABLET | Freq: Three times a day (TID) | ORAL | 6 refills | Status: DC

## 2019-04-21 ENCOUNTER — Other Ambulatory Visit: Payer: Self-pay

## 2019-04-25 ENCOUNTER — Telehealth: Payer: Self-pay

## 2019-04-25 NOTE — Telephone Encounter (Signed)
She has been stable on the present medications and should be continued.

## 2019-04-25 NOTE — Telephone Encounter (Signed)
Doing patients PA for Adempas just want to know has the patient tried any other drugs before you started them on this therapy?

## 2019-05-06 ENCOUNTER — Ambulatory Visit: Payer: Medicare Other

## 2019-05-26 ENCOUNTER — Ambulatory Visit: Payer: Medicare Other | Admitting: Internal Medicine

## 2019-05-30 ENCOUNTER — Other Ambulatory Visit: Payer: Self-pay | Admitting: Gastroenterology

## 2019-06-03 ENCOUNTER — Other Ambulatory Visit: Payer: Self-pay

## 2019-06-03 ENCOUNTER — Encounter: Payer: Self-pay | Admitting: Cardiology

## 2019-06-03 ENCOUNTER — Ambulatory Visit (INDEPENDENT_AMBULATORY_CARE_PROVIDER_SITE_OTHER): Payer: Medicare Other | Admitting: Cardiology

## 2019-06-03 VITALS — BP 134/68 | HR 59 | Temp 97.5°F | Ht 64.0 in | Wt 145.0 lb

## 2019-06-03 DIAGNOSIS — I10 Essential (primary) hypertension: Secondary | ICD-10-CM

## 2019-06-03 DIAGNOSIS — R0609 Other forms of dyspnea: Secondary | ICD-10-CM

## 2019-06-03 DIAGNOSIS — R0600 Dyspnea, unspecified: Secondary | ICD-10-CM

## 2019-06-03 DIAGNOSIS — I272 Pulmonary hypertension, unspecified: Secondary | ICD-10-CM

## 2019-06-03 NOTE — Progress Notes (Signed)
Primary Physician/Referring:  Glendale Chard, MD  Patient ID: Nancy Lambert, female    DOB: May 07, 1940, 79 y.o.   MRN: [id number]  Chief Complaint  Patient presents with  . Hypertension  . Pulmonary Hypertension  . Follow-up    3 month    HPI: Nancy Lambert  is a 79 y.o. AA female  Nancy Lambert  primary [place], hyperlipidemia, essential hypertension,  Chronic palpitations suggestive of PVC, H/O Raynaud's disease, sclerodactyly, and scleroderma. She has been started on Adempas for pulm HTN and has chronic dyspnea, lumbar radiculopathy.   She continues to have occasional palpitations related to her stress level. She does continue to have difficulty swallowing pills even with food due to dry mouth and follows [facility]. She now presents for 3 month OV. Dyspnea is stable.   Past Medical History:  Diagnosis Date  . Anxiety   . Cardiac arrhythmia   . Glaucoma   . Hypertension   . 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[employer] [place] [employer])   . Pulmonary hypertension ([place])   . Scleroderma ([place])   . Status post ablation of incompetent vein using laser bilateral leg 01/2019    Past Surgical History:  Procedure Laterality Date  . CARDIAC CATHETERIZATION N/A 07/24/2015   Procedure: Right Heart Cath;  Surgeon: Adrian Prows, MD;  Location: [facility];  Service: Cardiovascular;  Laterality: N/A;  . steroid shots  2020   for pain  . VESICOVAGINAL FISTULA CLOSURE W/ TAH      Social History   Socioeconomic History  . Marital status: Widowed    Spouse name: Not on file  . Number of children: 4  . Years of education: Not on file  . Highest education level: Not on file  Occupational History  . Occupation: Retired  Tobacco Use  . Smoking status: Former Smoker    Packs/day: 0.25    Years: 5.00    Pack years: 1.25    Types: Cigarettes    Quit date: 1971    Years since quitting: 50.2  . Smokeless tobacco: Never Used  . Tobacco comment: very light use when she did smoke  Substance and Sexual  Activity  . Alcohol use: Yes    Alcohol/week: 3.0 standard drinks    Types: 3 Shots of liquor per week    Comment: occasional  . Drug use: No  . Sexual activity: Yes  Other Topics Concern  . Not on file  Social History Narrative   Lives at home with her daughter   Right handed   Social Determinants of Health   Financial Resource Strain: High Risk  . Difficulty of Paying Living Expenses: Hard  Food Insecurity: Food Insecurity Present  . Worried About Charity fundraiser in the Last Year: Sometimes true  . Ran Out of Food in the Last Year: Sometimes true  Transportation Needs: No Transportation Needs  . Lack of Transportation (Medical): No  . Lack of Transportation (Non-Medical): No  Physical Activity: Insufficiently Active  . Days of Exercise per Week: 2 days  . Minutes of Exercise per Session: 20 min  Stress: No Stress Concern Present  . Feeling of Stress : Not at all  Social Connections:   . Frequency of Communication with Friends and Family: Not on file  . Frequency of Social Gatherings with Friends and Family: Not on file  . Attends Religious Services: Not on file  . Active Member of Clubs or Organizations: Not on file  . Attends Archivist Meetings: Not on file  .  Marital Status: Not on file  Intimate Partner Violence: Not At Risk  . Fear of Current or Ex-Partner: No  . Emotionally Abused: No  . Physically Abused: No  . Sexually Abused: No   Review of Systems  Constitution: Negative for decreased appetite and malaise/fatigue.  Cardiovascular: Positive for dyspnea on exertion (stable) and palpitations. Negative for chest pain, leg swelling (improved since starting therapy), orthopnea and syncope.  Respiratory: Positive for shortness of breath. Negative for wheezing.   Endocrine: Negative for cold intolerance and heat intolerance.  Hematologic/Lymphatic: Does not bruise/bleed easily.  Musculoskeletal: Positive for back pain, joint pain (hands and left hip)  and joint swelling (right knee).  Gastrointestinal: Positive for dysphagia (chronic) and heartburn (stable). Negative for diarrhea, nausea and vomiting.  Neurological: Negative for dizziness.  All other systems reviewed and are negative.  Objective   Vitals with BMI 06/03/2019 06/03/2019 03/30/2019  Height - 5\' 4"  -  Weight - 145 lbs -  BMI - [id number] -  Systolic [id number]  Diastolic 68 69 90  Pulse 59 64 90    Physical Exam  Constitutional: She appears well-developed and well-nourished.  HENT:  Head: Normocephalic and atraumatic.  Neck: Carotid bruit is not present.  Cardiovascular: Normal rate, regular rhythm and S1 normal.  Murmur heard.  Systolic murmur is present with a grade of 3/6 at the upper right sternal border and lower left sternal border. Pulses:      Carotid pulses are 2+ on the right side and 2+ on the left side.      Femoral pulses are 2+ on the right side and 2+ on the left side.      Popliteal pulses are 2+ on the right side and 2+ on the left side.       Dorsalis pedis pulses are 1+ on the right side and 1+ on the left side.       Posterior tibial pulses are 0 on the right side and 0 on the left side.  Increased S2. No leg edema, Mild tenderness along the sclerotherapy site in bilateral lower extremity noted. No DVT signs.  No JVD.  Pulmonary/Chest: Effort normal.  Abdominal: Soft. Bowel sounds are normal. She exhibits no ascites. There is hepatomegaly.  Skin: Skin is intact.  Abnormally dry due to scleroderma  Vitals reviewed.  Radiology: No results found.  Laboratory examination:    CMP Latest Ref Rng & Units 03/30/2019 01/11/2019 02/10/2018  Glucose 70 - 99 mg/dL 91 76 92  BUN 8 - 23 mg/dL 6(L) 9 10  Creatinine 0.44 - 1.00 mg/dL 0.58 0.70 0.85  Sodium 135 - 145 mmol/L 141 142 142  Potassium 3.5 - 5.1 mmol/L 3.3(L) 4.4 3.4(L)  Chloride 98 - 111 mmol/L 103 103 108  CO2 22 - 32 mmol/L 29 26 26   Calcium 8.9 - 10.3 mg/dL 9.2 10.2 9.5  Total Protein 6.0  - 8.5 g/dL - 6.7 -  Total Bilirubin 0.0 - 1.2 mg/dL - 0.6 -  Alkaline Phos 39 - 117 IU/L - 51 -  AST 0 - 40 IU/L - 15 -  ALT 0 - 32 IU/L - 11 -   CBC Latest Ref Rng & Units 03/30/2019 01/11/2019 02/10/2018  WBC 4.0 - 10.5 K/uL 3.5(L) 3.8 4.1  Hemoglobin 12.0 - 15.0 g/dL 12.5 13.0 12.4  Hematocrit 36.0 - 46.0 % 40.1 40.1 40.7  Platelets 150 - 400 K/uL 251 293 291   Lipid Panel  No results found for: CHOL, TRIG, HDL, CHOLHDL,  VLDL, LDLCALC, LDLDIRECT HEMOGLOBIN A1C No results found for: HGBA1C, MPG TSH No results for input(s): TSH in the last 8760 hours.  PRN Meds:. There are no discontinued medications. Current Meds  Medication Sig  . aspirin EC 81 MG tablet Take 81 mg by mouth daily.  . DULoxetine (CYMBALTA) 30 MG capsule Take 1 capsule (30 mg total) by mouth daily.  . Ibuprofen (ADVIL PO) Take by mouth. prn  . omeprazole (PRILOSEC) 40 MG capsule TAKE 1 CAPSULE BY MOUTH ONCE DAILY 30  MINUTES  BEFORE  FIRST  MEAL  OF  THE  DAY  . Riociguat (ADEMPAS) 2 MG TABS Take 1 tablet by mouth 3 (three) times daily.   Cardiac Studies:   Lexiscan myoview stress test 06/18/2015: 1. Resting EKG demonstrates normal sinus rhythm, left axis deviation, cannot exclude inferior infarct old, Anterior infarct old.  Stress EKG was negative for myocardial ischemia.  Patient exercised for 4 minutes and 5 seconds and achieved 5.93 Mets.  There are frequent PVCs in the recovery phase of the stress test.  Stress terminated due to achieving target heart rate, 86% of MPHR.  Stress symptoms included dyspnea. 2. Myocardial perfusion imaging is normal. Overall left ventricular systolic function was normal without regional wall motion abnormalities. The left ventricular ejection fraction was 62%.  Right heart cath 07/24/2015: Right Heart Pressure:  RA A Wave  13 mmHg,   RA V Wave  9 mmHg,   RA Mean  8 mmHg  RV Systolic Pressure  48 mmHg,   RV Diastolic Pressure  4 mmHg, RV EDP  11 mmHg  PA Systolic Pressure  48  mmHg,   PA Diastolic Pressure  14 mmHg,   PA Mean  29 mmHg. PW A Wave  19 mmHg,   PW V Wave  17 mmHg,   PW Mean  15 mmHg  QP/QS  1    Total pulmonary vascular resistance 7 Wood units.   Impression: Mild to moderate pulmonary hypertension. Wedge pressure although normal, in the upper end of spectrum suggesting the pulmonary hypertension could also be related to mild LV diastolic heart failure. Findings probably more consistent with primary pulmonary hypertension. Clinical correlation recommended.   Echocardiogram 02/07/2019: Mild concentric hypertrophy of the left ventricle. Left ventricle cavity is normal in size. Normal LV systolic function with EF 55%. Normal global wall motion. Doppler evidence of grade II (pseudonormal) diastolic dysfunction, elevated LAP.  Left atrial cavity is moderately dilated. Trileaflet aortic valve with mild aortic valve leaflet thickening. Increased LVOT velocity with only trace valvular aortic stenosis. Mean PG 8 mmHg.  Mild to moderate mitral regurgitation. Mild to moderate tricuspid regurgitation. Estimated pulmonary artery systolic pressure is 35 mmHg. Small to moderate, predominantly posteriorly located pericardial effusion. No RV collapse or any other evidence of hemodynamic compromise.  IVC is dilated with respiratory variation. Estimated RA pressure 8 mmHg. No significant change compared to previous study on 08/27/2018. ____________________________________________________ SIX MIN WALK 06/03/2019 03/04/2019 12/13/2018 12/09/2018 08/11/2018 05/04/2018 08/18/2016  Medications Aspirin - - - - - ASA 81 tab and Prilosec 20mg  at 6:30am  Supplimental Oxygen during Test? (L/min) No No No No No No No  Laps 15 14 12 12  253.59 19 7  Partial Lap (in Meters) 300 280 234.1 - - 15.24 0  Baseline BP (sitting) 116/69 - - - - - 102/62  Baseline Heartrate 71 73 66 66 78 - 65  Baseline Dyspnea (Borg Scale) - - - - - - 1  Baseline Fatigue (Borg Scale) - - - - - -  2  Baseline SPO2 97  100 100 - 99 96 100  BP (sitting) 134/68 - - - - - 132/84  Heartrate 92 99 52 52 101 - 90  Dyspnea (Borg Scale) - - - - - - 1  Fatigue (Borg Scale) - - - - - - 2  SPO2 90 100 53 53 94 96 98  BP (sitting) - - - - - - 108/70  Heartrate 84 84 73 66 89 - 59  SPO2 74 100 94 97 99 - 100  Stopped or Paused before Six Minutes - No No No No No No  Interpretation Dizziness;Leg pain - Leg pain - - - Dizziness  Distance Completed 810 756 642.1 - - 661.24 336  Tech Comments: - - - (No Data) (No Data) - pt walked at a normal pace (per pt)- no desat at end of test, no breaks during test///amg   Provider Comments: - - - - 253.59 meters (No Data) -   Assessment   1. Pulmonary hypertension ([place])   2. Dyspnea on exertion   3. Primary hypertension    EKG 09/28/2018: Normal sinus rhythm at rate 78 bpm, left axis deviation, left and trifascicular block.  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  IVCD, LVH.  Abnormal EKG.  EKG 05/04/2018: Sinus bradycardia at the rate of 58 bpm with 1 PAC, left axis deviation, left anterior fasicular block. 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 Low voltage complexes. Abnormal EKG. No changes from EKG 01/25/2018  Recommendations:    Nancy Lambert  is a 79 y.o. AA female  Wit  primary [place], hyperlipidemia, essential hypertension,  Chronic palpitations suggestive of PVC, H/O Raynaud's disease, sclerodactyly, and scleroderma. She has been started on Adempas for pulm HTN and has chronic dyspnea, lumbar radiculopathy.  She is presently doing well, 6-minute walk test revealed stability, no clinical evidence heart failure, since last office visit she has bilateral vein sclerotherapy, blood pressures well controlled since being on amlodipine, I reviewed her LFTs and CBC that also stable.  She needs lipid profile testing and TSH and orders have been placed.  I will see her back in 3 months for follow-up of pulmonary hypertension.     Adrian Prows, MD, [facility] 06/03/2019, 12:17 PM [facility]

## 2019-06-03 NOTE — Patient Instructions (Signed)
You need cholesterol and thyroid checked. Orders are in and can have it done at Dr. Lynder Parents office

## 2019-06-07 ENCOUNTER — Ambulatory Visit (INDEPENDENT_AMBULATORY_CARE_PROVIDER_SITE_OTHER): Payer: Medicare Other | Admitting: Internal Medicine

## 2019-06-07 ENCOUNTER — Encounter: Payer: Self-pay | Admitting: Internal Medicine

## 2019-06-07 ENCOUNTER — Other Ambulatory Visit: Payer: Self-pay

## 2019-06-07 VITALS — BP 120/68 | HR 55 | Temp 98.7°F | Ht 64.0 in | Wt 142.6 lb

## 2019-06-07 DIAGNOSIS — M5416 Radiculopathy, lumbar region: Secondary | ICD-10-CM | POA: Diagnosis not present

## 2019-06-07 DIAGNOSIS — I272 Pulmonary hypertension, unspecified: Secondary | ICD-10-CM

## 2019-06-07 DIAGNOSIS — M349 Systemic sclerosis, unspecified: Secondary | ICD-10-CM

## 2019-06-07 DIAGNOSIS — Z8616 Personal history of COVID-19: Secondary | ICD-10-CM

## 2019-06-07 LAB — LIPID PANEL WITH LDL/HDL RATIO
Cholesterol, Total: 170 mg/dL (ref 100–199)
HDL: 56 mg/dL (ref >39)
LDL Chol Calc (NIH): 102 mg/dL — ABNORMAL HIGH (ref 0–99)
LDL/HDL Ratio: 1.8 ratio (ref 0.0–3.2)
Triglycerides: 64 mg/dL (ref 0–149)
VLDL Cholesterol Cal: 12 mg/dL (ref 5–40)

## 2019-06-07 LAB — TSH: TSH: 1.2 u[IU]/mL (ref 0.450–4.500)

## 2019-06-07 NOTE — Patient Instructions (Addendum)
  Dr. Dossie Der  [phone] call to make appointment   COVID-19: How to Protect Yourself and Others Know how it spreads  There is currently no vaccine to prevent coronavirus disease 2019 (COVID-19).  The best way to prevent illness is to avoid being exposed to this virus.  The virus is thought to spread mainly from person-to-person. ? Between people who are in close contact with one another (within about 6 feet). ? Through respiratory droplets produced when an infected person coughs, sneezes or talks. ? These droplets can land in the mouths or noses of people who are nearby or possibly be inhaled into the lungs. ? COVID-19 may be spread by people who are not showing symptoms. Everyone should Clean your hands often  Wash your hands often with soap and water for at least 20 seconds especially after you have been in a public place, or after blowing your nose, coughing, or sneezing.  If soap and water are not readily available, use a hand sanitizer that contains at least 60% alcohol. Cover all surfaces of your hands and rub them together until they feel dry.  Avoid touching your eyes, nose, and mouth with unwashed hands. Avoid close contact  Limit contact with others as much as possible.  Avoid close contact with people who are sick.  Put distance between yourself and other people. ? Remember that some people without symptoms may be able to spread virus. ? This is especially important for people who are at higher risk of getting very GainPain.com.cy Cover your mouth and nose with a mask when around others  You could spread COVID-19 to others even if you do not feel sick.  Everyone should wear a mask in public settings and when around people not living in their household, especially when social distancing is difficult to maintain. ? Masks should not be placed on young children under age 70, anyone who has  trouble breathing, or is unconscious, incapacitated or otherwise unable to remove the mask without assistance.  The mask is meant to protect other people in case you are infected.  Do NOT use a facemask meant for a Dietitian.  Continue to keep about 6 feet between yourself and others. The mask is not a substitute for social distancing. Cover coughs and sneezes  Always cover your mouth and nose with a tissue when you cough or sneeze or use the inside of your elbow.  Throw used tissues in the trash.  Immediately wash your hands with soap and water for at least 20 seconds. If soap and water are not readily available, clean your hands with a hand sanitizer that contains at least 60% alcohol. Clean and disinfect  Clean AND disinfect frequently touched surfaces daily. This includes tables, doorknobs, light switches, countertops, handles, desks, phones, keyboards, toilets, faucets, and sinks. RackRewards.fr  If surfaces are dirty, clean them: Use detergent or soap and water prior to disinfection.  Then, use a household disinfectant. You can see a list of EPA-registered household disinfectants here. michellinders.com 12/01/2018 This information is not intended to replace advice given to you by your health care provider. Make sure you discuss any questions you have with your health care provider. Document Revised: 12/09/2018 Document Reviewed: 10/07/2018 Elsevier Patient Education  [place].

## 2019-06-07 NOTE — Progress Notes (Signed)
This visit occurred during the SARS-CoV-2 public health emergency.  Safety protocols were in place, including screening questions prior to the visit, additional usage of staff PPE, and extensive cleaning of exam room while observing appropriate contact time as indicated for disinfecting solutions.  Subjective:     Patient ID: Nancy Lambert , female    DOB: 04-Sep-1940 , 79 y.o.   MRN: [id number]   Chief Complaint  Patient presents with  . Back Pain    follow-up    HPI  She is here today for f/u chronic back pain. She has stopped duloxetine since her last visit. She feels that is no longer needed. Her back pain has improved.  She reports she is feeling quite well at this time.     Past Medical History:  Diagnosis Date  . Anxiety   . Cardiac arrhythmia   . Glaucoma   . Hypertension   . 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[place])   . Pulmonary hypertension ([place])   . Scleroderma ([place])   . 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 Breast cancer Mother   . Clotting disorder Mother   . Diabetes Mother   . Breast cancer Sister   . Clotting disorder Sister   . Prostate cancer Son   . Breast cancer Daughter   . Other Father        drowned  . Breast cancer Sister   . Breast cancer Other   . Colon cancer Neg Hx      Current Outpatient Medications:  .  aspirin EC 81 MG tablet, Take 81 mg by mouth daily., Disp: , Rfl:  .  Ibuprofen (ADVIL PO), Take by mouth. prn, Disp: , Rfl:  .  omeprazole (PRILOSEC) 40 MG capsule, TAKE 1 CAPSULE BY MOUTH ONCE DAILY 30  MINUTES  BEFORE  FIRST  MEAL  OF  THE  DAY, Disp: 90 capsule, Rfl: 0 .  Riociguat (ADEMPAS) 2 MG TABS, Take 1 tablet by mouth 3 (three) times daily., Disp: 30 tablet, Rfl: 6 .  DULoxetine (CYMBALTA) 30 MG capsule, Take 1 capsule (30 mg total) by mouth daily. (Patient not taking: Reported on 06/07/2019), Disp: 30 capsule, Rfl: 2   No Known  Allergies   Review of Systems  Constitutional: Negative.   Respiratory: Negative.   Cardiovascular: Negative.   Gastrointestinal: Negative.   Neurological: Negative.   Psychiatric/Behavioral: Negative.      Today's Vitals   06/07/19 1449  BP: 120/68  Pulse: (!) 55  Temp: 98.7 F (37.1 C)  TempSrc: Oral  Weight: 142 lb 9.6 oz (64.7 kg)  Height: 5\' 4"  (1.626 m)  PainSc: 0-No pain   Body mass index is 24.48 kg/m.   Objective:  Physical Exam Vitals and nursing note reviewed.  Constitutional:      Appearance: Normal appearance.  HENT:     Head: Normocephalic and atraumatic.  Cardiovascular:     Rate and Rhythm: Normal rate and regular rhythm.     Heart sounds: Murmur present.  Pulmonary:     Effort: Pulmonary effort is normal.     Breath sounds: Normal breath sounds.  Skin:    General: Skin is warm.  Neurological:     General: No focal deficit present.     Mental Status: She is alert.  Psychiatric:        Mood and Affect: Mood normal.  Behavior: Behavior normal.         Assessment And Plan:     1. Lumbar radiculopathy  Chronic, her sx have improved. She is encouraged to perform stretching exercises regularly.   2. Scleroderma (HCC)  Chronic. She admits that she has not seen Dr. Dossie Der in awhile. She is encouraged to call and schedule a f/u appt.   3. Pulmonary hypertension (HCC)  Chronic, yet stable. She is also followed by Cardiology. She is encouraged to follow dietary recommendations and take meds as directed. Labs drawn by Dr. Einar Gip reviewed in detail during her visit.   4. Personal history of covid-19  She denies experiencing any long-term effects.    I personally spent 25 minutes face-to-face and non-face-to-face in the care of this patient, which includes all pre-, intra-, and post visit time on the date of service.   Maximino Greenland, MD    THE PATIENT IS ENCOURAGED TO PRACTICE SOCIAL DISTANCING DUE TO THE COVID-19 PANDEMIC.

## 2019-06-17 ENCOUNTER — Other Ambulatory Visit: Payer: Medicare Other

## 2019-07-12 ENCOUNTER — Ambulatory Visit (INDEPENDENT_AMBULATORY_CARE_PROVIDER_SITE_OTHER): Payer: Medicare Other | Admitting: Gastroenterology

## 2019-07-12 ENCOUNTER — Encounter: Payer: Self-pay | Admitting: Gastroenterology

## 2019-07-12 VITALS — BP 136/60 | HR 62 | Temp 97.3°F | Ht 64.0 in | Wt 149.2 lb

## 2019-07-12 DIAGNOSIS — R600 Localized edema: Secondary | ICD-10-CM

## 2019-07-12 DIAGNOSIS — M79661 Pain in right lower leg: Secondary | ICD-10-CM

## 2019-07-12 DIAGNOSIS — M7989 Other specified soft tissue disorders: Secondary | ICD-10-CM

## 2019-07-12 DIAGNOSIS — M349 Systemic sclerosis, unspecified: Secondary | ICD-10-CM | POA: Diagnosis not present

## 2019-07-12 DIAGNOSIS — I272 Pulmonary hypertension, unspecified: Secondary | ICD-10-CM | POA: Diagnosis not present

## 2019-07-12 DIAGNOSIS — K219 Gastro-esophageal reflux disease without esophagitis: Secondary | ICD-10-CM

## 2019-07-12 MED ORDER — OMEPRAZOLE 40 MG PO CPDR: DELAYED_RELEASE_CAPSULE | ORAL | 4 refills | Status: DC

## 2019-07-12 NOTE — Patient Instructions (Addendum)
You have been scheduled for an Doppler ultrasound at [facility] and Vascular Entrance C  on 07/14/2019 at 9:00am. Please arrive 15 minutes prior to your appointment for registration. Should you need to reschedule your appointment, please contact radiology at [phone]. This test typically takes about 30 minutes to perform.  Please call and schedule a follow- up appointment with your Cardiologist as soon as possible.  We have sent the following medications to your pharmacy for you to pick up at your convenience: Omeprazole   Due to recent changes in healthcare laws, you may see the results of your imaging and laboratory studies on MyChart before your provider has had a chance to review them.  We understand that in some cases there may be results that are confusing or concerning to you. Not all laboratory results come back in the same time frame and the provider may be waiting for multiple results in order to interpret others.  Please give [place] 48 hours in order for your provider to thoroughly review all the results before contacting the office for clarification of your results.   Thank you for choosing me and [facility].  Dr.Nandigam

## 2019-07-12 NOTE — Progress Notes (Signed)
Nancy Lambert    [id number]    01/02/1941  Primary Care Physician:Sanders, Bailey Mech, MD  Referring Physician: Glendale Chard, [place] [address] [place],  [place] [address]   Chief complaint: GERD  HPI: 79 year old female with history of scleroderma and chronic GERD here for follow-up visit  EGD January 27, 2017: Dilated esophagus with changes suspicious for Short segment Barrett's esophagus, biopsies negative for intestinal metaplasia, showed reflux related changes. Colonoscopy February 13, 2017: 1 tubular adenoma and 2 sessile serrated polyp removed from colon.  Diverticulosis  GERD symptoms stable.  Denies any dysphagia, odynophagia, loss of appetite or weight change  Denies any nausea, vomiting, abdominal pain, melena or bright red blood per rectum   Review of system positive for lower leg swelling, leg pain and shortness of breath She started noticing her feet and ankles swelling in the past 2 weeks, progressively worse she is also having difficulty fitting into her shoes  She had COVID-19 infection in December 2020 and she also received Covid vaccine 2 weeks ago  She has history of cardiomegaly and pulmonary hypertension  Echocardiogram 02/07/2019:  Mild concentric hypertrophy of the left ventricle. Left ventricle cavity  is normal in size. Normal LV systolic function with EF 55%. Normal global  wall motion. Doppler evidence of grade II (pseudonormal) diastolic  dysfunction, elevated LAP.  Left atrial cavity is moderately dilated.  Trileaflet aortic valve with mild aortic valve leaflet thickening.  Increased LVOT velocity with only trace valvular aortic stenosis. Mean PG  8 mmHg.  Mild to moderate mitral regurgitation.  Mild to moderate tricuspid regurgitation. Estimated pulmonary artery  systolic pressure is 35 mmHg.  Small to moderate, predominantly posteriorly located pericardial effusion.  No RV collapse or any other evidence of  hemodynamic compromise.  IVC is dilated with respiratory variation. Estimated RA pressure 8 mmHg.  No significant change compared to previous study on 08/27/2018.   Outpatient Encounter Medications as of 07/12/2019  Medication Sig  . aspirin EC 81 MG tablet Take 81 mg by mouth daily.  . DULoxetine (CYMBALTA) 30 MG capsule Take 1 capsule (30 mg total) by mouth daily. (Patient not taking: Reported on 06/07/2019)  . Ibuprofen (ADVIL PO) Take by mouth. prn  . omeprazole (PRILOSEC) 40 MG capsule TAKE 1 CAPSULE BY MOUTH ONCE DAILY 30  MINUTES  BEFORE  FIRST  MEAL  OF  THE  DAY  . Riociguat (ADEMPAS) 2 MG TABS Take 1 tablet by mouth 3 (three) times daily.   No facility-administered encounter medications on file as of 07/12/2019.    Allergies as of 07/12/2019  . (No Known Allergies)    Past Medical History:  Diagnosis Date  . Anxiety   . Cardiac arrhythmia   . Glaucoma   . Hypertension   . 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[place])   . Pulmonary hypertension ([place])   . Scleroderma ([place])   . Status post ablation of incompetent vein using laser bilateral leg 01/2019    Past Surgical History:  Procedure Laterality Date  . CARDIAC CATHETERIZATION N/A 07/24/2015   Procedure: Right Heart Cath;  Surgeon: Adrian Prows, MD;  Location: [facility];  Service: Cardiovascular;  Laterality: N/A;  . steroid shots  2020   for pain  . VESICOVAGINAL FISTULA CLOSURE W/ TAH      Family History  Problem Relation Age of Onset  . Breast cancer Mother   . Clotting disorder Mother   . Diabetes Mother   .  Breast cancer Sister   . Clotting disorder Sister   . Prostate cancer Son   . Breast cancer Daughter   . Other Father        drowned  . Breast cancer Sister   . Breast cancer Other   . Colon cancer Neg Hx     Social History   Socioeconomic History  . Marital status: Widowed    Spouse name: Not on file  . Number of children: 4  . Years of education: Not on file  . Highest education level: Not  on file  Occupational History  . Occupation: Retired  Tobacco Use  . Smoking status: Former Smoker    Packs/day: 0.25    Years: 5.00    Pack years: 1.25    Types: Cigarettes    Quit date: 1971    Years since quitting: 50.3  . Smokeless tobacco: Never Used  . Tobacco comment: very light use when she did smoke  Substance and Sexual Activity  . Alcohol use: Yes    Alcohol/week: 3.0 standard drinks    Types: 3 Shots of liquor per week    Comment: occasional  . Drug use: No  . Sexual activity: Yes  Other Topics Concern  . Not on file  Social History Narrative   Lives at home with her daughter   Right handed   Social Determinants of Health   Financial Resource Strain: High Risk  . Difficulty of Paying Living Expenses: Hard  Food Insecurity: Food Insecurity Present  . Worried About Charity fundraiser in the Last Year: Sometimes true  . Ran Out of Food in the Last Year: Sometimes true  Transportation Needs: No Transportation Needs  . Lack of Transportation (Medical): No  . Lack of Transportation (Non-Medical): No  Physical Activity: Insufficiently Active  . Days of Exercise per Week: 2 days  . Minutes of Exercise per Session: 20 min  Stress: No Stress Concern Present  . Feeling of Stress : Not at all  Social Connections:   . Frequency of Communication with Friends and Family:   . Frequency of Social Gatherings with Friends and Family:   . Attends Religious Services:   . Active Member of Clubs or Organizations:   . Attends Archivist Meetings:   Marland Kitchen Marital Status:   Intimate Partner Violence: Not At Risk  . Fear of Current or Ex-Partner: No  . Emotionally Abused: No  . Physically Abused: No  . Sexually Abused: No      Review of systems:  All other review of systems negative except as mentioned in the HPI.   Physical Exam: Vitals:   07/12/19 1055  BP: 136/60  Pulse: 62  Temp: (!) 97.3 F (36.3 C)  SpO2: 96%   Body mass index is 25.61 kg/m. Gen:       No acute distress Abd:      soft, non-tender; no palpable masses, no distension Ext:    2 +edema bilateral with right calf tenderness Neuro: alert and oriented x 3 Psych: normal mood and affect  Data Reviewed:  Reviewed labs, radiology imaging, old records and pertinent past GI work up   Assessment and Plan/Recommendations:  79 year old female with history of scleroderma, small pericardial effusion, pulmonary hypertension, cardiomegaly and chronic GERD  GERD: Symptoms are stable Continue omeprazole 40 mg daily, 30 minutes before breakfast Continue antireflux measures and lifestyle modifications  Colorectal cancer screening: High risk with history of tubular adenoma and sessile serrated polyp X3 Due for  recall colonoscopy November 2021  Bilateral lower extremity edema with calf tenderness: We will need to exclude DVT Schedule lower extremity Doppler ASAP Advised patient to follow-up with PMD and cardiology for further evaluation of lower extremity edema, worsening pulmonary hypertension or cardiac function  This visit required 45 minutes of patient care (this includes precharting, chart review, review of results, face-to-face time used for counseling as well as treatment plan and follow-up. The patient was provided an opportunity to ask questions and all were answered. The patient agreed with the plan and demonstrated an understanding of the instructions.  Damaris Hippo , MD    CC: Glendale Chard, MD

## 2019-07-14 ENCOUNTER — Ambulatory Visit (HOSPITAL_COMMUNITY)
Admission: RE | Admit: 2019-07-14 | Discharge: 2019-07-14 | Disposition: A | Discharge status: Home / Self Care | Payer: Medicare Other | Source: Ambulatory Visit | Attending: Gastroenterology | Admitting: Gastroenterology

## 2019-07-14 ENCOUNTER — Other Ambulatory Visit: Payer: Self-pay

## 2019-07-14 ENCOUNTER — Telehealth: Payer: Self-pay

## 2019-07-14 DIAGNOSIS — M7989 Other specified soft tissue disorders: Secondary | ICD-10-CM | POA: Diagnosis not present

## 2019-07-14 NOTE — Telephone Encounter (Signed)
Thank you for letting me know. Please advise patient to follow-up with PMD for further evaluation of enlarged groin lymph nodes and forward the results to PMDs office. Thank you

## 2019-07-14 NOTE — Telephone Encounter (Signed)
Unable to reach pt by phone or leave a message. Result letter mailed to pt, copy of doppler sent to pts PCP.

## 2019-07-14 NOTE — Progress Notes (Signed)
VASCULAR LAB PRELIMINARY  PRELIMINARY  PRELIMINARY  PRELIMINARY  Bilateral lower extremity venous duplex completed.    Preliminary report:  See CV proc for preliminary results.  Called report to Malcolm Metro, [facility], RVT 07/14/2019, 10:04 AM

## 2019-07-14 NOTE — Telephone Encounter (Signed)
Received call report from [place] with [facility] lab: Negative DVT, enlarged lymph nodes in groin bilaterally. Dr. Silverio Decamp notified.

## 2019-08-15 ENCOUNTER — Other Ambulatory Visit: Payer: Self-pay

## 2019-08-15 MED ORDER — ADEMPAS 2 MG PO TABS: 1.0000 | ORAL_TABLET | Freq: Three times a day (TID) | ORAL | 6 refills | Status: DC

## 2019-08-16 ENCOUNTER — Other Ambulatory Visit: Payer: Self-pay

## 2019-08-31 ENCOUNTER — Other Ambulatory Visit: Payer: Self-pay

## 2019-08-31 ENCOUNTER — Ambulatory Visit
Admission: RE | Admit: 2019-08-31 | Discharge: 2019-08-31 | Disposition: A | Discharge status: Home / Self Care | Payer: Medicare Other | Source: Ambulatory Visit | Attending: Internal Medicine | Admitting: Internal Medicine

## 2019-08-31 DIAGNOSIS — E2839 Other primary ovarian failure: Secondary | ICD-10-CM

## 2019-08-31 DIAGNOSIS — Z780 Asymptomatic menopausal state: Secondary | ICD-10-CM | POA: Diagnosis not present

## 2019-08-31 DIAGNOSIS — M8589 Other specified disorders of bone density and structure, multiple sites: Secondary | ICD-10-CM | POA: Diagnosis not present

## 2019-09-07 ENCOUNTER — Ambulatory Visit: Payer: Medicare Other | Admitting: Nurse Practitioner

## 2019-09-07 ENCOUNTER — Ambulatory Visit: Payer: Medicare Other

## 2019-09-08 ENCOUNTER — Telehealth: Payer: Self-pay

## 2019-09-08 NOTE — Telephone Encounter (Signed)
Left vm for pt to return call for lab results  

## 2019-09-08 NOTE — Telephone Encounter (Signed)
-----   Message from Glendale Chard, MD sent at 09/05/2019  7:46 PM EDT ----- Dexa scan results: osteopenia of hip. This is thinning of the bone. Important to engage in weight-bearing exercises like walking 20-30 minutes three days per week. Is she taking calcium and vitamin d? Repeat test in 2 years.

## 2019-09-08 NOTE — Progress Notes (Signed)
Primary Physician/Referring:  Glendale Chard, MD  Patient ID: Nancy Lambert, female    DOB: 1941/03/03, 79 y.o.   MRN: [id number]  Chief Complaint  Patient presents with  . Pulmonary Hypertension  . Follow-up    3 month   HPI:    Nancy Lambert  is a 79 y.o. AA female  [place]  primary [place], hyperlipidemia, essential hypertension,  Chronic palpitations suggestive of PVC, H/O Raynaud's disease, sclerodactyly, and scleroderma. She has been started on Adempas for pulm HTN and has chronic dyspnea, lumbar radiculopathy.   She continues to have occasional palpitations related to her stress level. She does continue to have difficulty swallowing pills even with food due to dry mouth and follows [facility]. She now presents for 3 month OV. Dyspnea is stable. No leg edema.   Past Medical History:  Diagnosis Date  . Anxiety   . Cardiac arrhythmia   . Glaucoma   . Hypertension   . 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[place])   . Pulmonary hypertension ([place])   . Scleroderma ([place])   . Status post ablation of incompetent vein using laser bilateral leg 01/2019   Past Surgical History:  Procedure Laterality Date  . CARDIAC CATHETERIZATION N/A 07/24/2015   Procedure: Right Heart Cath;  Surgeon: Adrian Prows, MD;  Location: [facility];  Service: Cardiovascular;  Laterality: N/A;  . steroid shots  2020   for pain  . VESICOVAGINAL FISTULA CLOSURE W/ TAH     Family History  Problem Relation Age of Onset  . Breast cancer Mother   . Clotting disorder Mother   . Diabetes Mother   . Breast cancer Sister   . Clotting disorder Sister   . Prostate cancer Son   . Breast cancer Daughter   . Other Father        drowned  . Breast cancer Sister   . Breast cancer Other   . Colon cancer Neg Hx     Social History   Tobacco Use  . Smoking status: Former Smoker    Packs/day: 0.25    Years: 5.00    Pack years: 1.25    Types: Cigarettes    Quit date: 1971    Years since quitting: 50.4  .  Smokeless tobacco: Never Used  . Tobacco comment: very light use when she did smoke  Substance Use Topics  . Alcohol use: Yes    Alcohol/week: 3.0 standard drinks    Types: 3 Shots of liquor per week    Comment: occasional   Marital Status: Widowed  ROS  Review of Systems  Cardiovascular: Positive for dyspnea on exertion (stable). Negative for leg swelling and syncope.  Musculoskeletal: Positive for arthritis, back pain and joint pain.  Gastrointestinal: Negative for melena.   Objective  Blood pressure (!) 141/63, pulse 64, resp. rate 16, height 5\' 4"  (1.626 m), weight 142 lb (64.4 kg), SpO2 97 %.  Vitals with BMI 09/09/2019 07/12/2019 06/07/2019  Height 5\' 4"  5\' 4"  5\' 4"   Weight 142 lbs 149 lbs 3 oz 142 lbs 10 oz  BMI 24.36 78.2 95.62  Systolic 130 865 784  Diastolic 63 60 68  Pulse 64 62 55     Physical Exam Vitals reviewed.  Constitutional:      General: She is not in acute distress.    Appearance: She is well-developed.  Neck:     Vascular: No carotid bruit.  Cardiovascular:     Rate and Rhythm: Normal rate and regular rhythm.  Pulses: Intact distal pulses.          Carotid pulses are 2+ on the right side and 2+ on the left side.      Femoral pulses are 2+ on the right side and 2+ on the left side.      Popliteal pulses are 2+ on the right side and 2+ on the left side.       Dorsalis pedis pulses are 1+ on the right side and 1+ on the left side.       Posterior tibial pulses are 0 on the right side and 0 on the left side.     Heart sounds: No murmur heard.  at the upper right sternal border and lower left sternal border.  No gallop.      Comments: S1 normal, S2 is  Loud and accentuated. No leg edema, no JVD.  Pulmonary:     Effort: Pulmonary effort is normal. No accessory muscle usage.     Breath sounds: Normal breath sounds.  Abdominal:     General: Bowel sounds are normal.     Palpations: Abdomen is soft. There is hepatomegaly.  Skin:    Comments: Abnormally  dry due to scleroderma    Laboratory examination:   Recent Labs    01/11/19 1246 03/30/19 1831  NA 142 141  K 4.4 3.3*  CL 103 103  CO2 26 29  GLUCOSE 76 91  BUN 9 6*  CREATININE 0.70 0.58  CALCIUM 10.2 9.2  GFRNONAA 84 >60  GFRAA 97 >60   CrCl cannot be calculated (Patient's most recent lab result is older than the maximum 21 days allowed.).  CMP Latest Ref Rng & Units 03/30/2019 01/11/2019 02/10/2018  Glucose 70 - 99 mg/dL 91 76 92  BUN 8 - 23 mg/dL 6(L) 9 10  Creatinine 0.44 - 1.00 mg/dL 0.58 0.70 0.85  Sodium 135 - 145 mmol/L 141 142 142  Potassium 3.5 - 5.1 mmol/L 3.3(L) 4.4 3.4(L)  Chloride 98 - 111 mmol/L 103 103 108  CO2 22 - 32 mmol/L 29 26 26   Calcium 8.9 - 10.3 mg/dL 9.2 10.2 9.5  Total Protein 6.0 - 8.5 g/dL - 6.7 -  Total Bilirubin 0.0 - 1.2 mg/dL - 0.6 -  Alkaline Phos 39 - 117 IU/L - 51 -  AST 0 - 40 IU/L - 15 -  ALT 0 - 32 IU/L - 11 -   CBC Latest Ref Rng & Units 03/30/2019 01/11/2019 02/10/2018  WBC 4.0 - 10.5 K/uL 3.5(L) 3.8 4.1  Hemoglobin 12.0 - 15.0 g/dL 12.5 13.0 12.4  Hematocrit 36 - 46 % 40.1 40.1 40.7  Platelets 150 - 400 K/uL 251 293 291    Lipid Panel    Component Value Date/Time   CHOL 170 06/06/2019 0847   TRIG 64 06/06/2019 0847   HDL 56 06/06/2019 0847   LDLCALC 102 (H) 06/06/2019 0847    HEMOGLOBIN A1C No results found for: HGBA1C, MPG TSH Recent Labs    06/06/19 0847  TSH 1.200    Medications and allergies  No Known Allergies   Current Outpatient Medications  Medication Instructions  . aspirin EC 81 mg, Oral, Daily  . diltiazem (CARDIZEM CD) 180 mg, Oral, Daily  . omeprazole (PRILOSEC) 40 MG capsule TAKE 1 CAPSULE BY MOUTH ONCE DAILY 30  MINUTES  BEFORE  FIRST  MEAL  OF  THE  DAY  . Riociguat (ADEMPAS) 2 MG TABS 1 tablet, Oral, 3 times daily    Medications Discontinued  During This Encounter  Medication Reason  . DULoxetine (CYMBALTA) 30 MG capsule Patient Preference  . Ibuprofen (ADVIL PO) Patient Preference     Radiology:   No results found.  Cardiac Studies:   Lexiscan myoview stress test 06/18/2015: 1. Resting EKG demonstrates normal sinus rhythm, left axis deviation, cannot exclude inferior infarct old, Anterior infarct old.  Stress EKG was negative for myocardial ischemia.  Patient exercised for 4 minutes and 5 seconds and achieved 5.93 Mets.  There are frequent PVCs in the recovery phase of the stress test.  Stress terminated due to achieving target heart rate, 86% of MPHR.  Stress symptoms included dyspnea. 2. Myocardial perfusion imaging is normal. Overall left ventricular systolic function was normal without regional wall motion abnormalities. The left ventricular ejection fraction was 62%.  Right heart cath 07/24/2015: Right Heart Pressure:  RA A Wave  13 mmHg,   RA V Wave  9 mmHg,   RA Mean  8 mmHg  RV Systolic Pressure  48 mmHg,   RV Diastolic Pressure  4 mmHg, RV EDP  11 mmHg  PA Systolic Pressure  48 mmHg,   PA Diastolic Pressure  14 mmHg,   PA Mean  29 mmHg. PW A Wave  19 mmHg,   PW V Wave  17 mmHg,   PW Mean  15 mmHg  QP/QS  1    Total pulmonary vascular resistance 7 Wood units.   Impression: Mild to moderate pulmonary hypertension. Wedge pressure although normal, in the upper end of spectrum suggesting the pulmonary hypertension could also be related to mild LV diastolic heart failure. Findings probably more consistent with primary pulmonary hypertension. Clinical correlation recommended.   Echocardiogram 02/07/2019: Mild concentric hypertrophy of the left ventricle. Left ventricle cavity is normal in size. Normal LV systolic function with EF 55%. Normal global wall motion. Doppler evidence of grade II (pseudonormal) diastolic dysfunction, elevated LAP.  Left atrial cavity is moderately dilated. Trileaflet aortic valve with mild aortic valve leaflet thickening. Increased LVOT velocity with only trace valvular aortic stenosis. Mean PG 8 mmHg.  Mild to moderate mitral  regurgitation. Mild to moderate tricuspid regurgitation. Estimated pulmonary artery systolic pressure is 35 mmHg. Small to moderate, predominantly posteriorly located pericardial effusion. No RV collapse or any other evidence of hemodynamic compromise.  IVC is dilated with respiratory variation. Estimated RA pressure 8 mmHg. No significant change compared to previous study on 08/27/2018.  Lower Venous DVT Study 07/14/2019: BILATERAL:  - No evidence of deep vein thrombosis seen in the lower extremities, bilaterally.   EKG  EKG 09/09/2019: Sinus bradycardia at rate of 58 bpm, right atrial enlargement, left axis deviation, left anterior fascicular block.  Poor R wave progression, cannot exclude anteroseptal infarct old.  IVCD, LVH with repolarization abnormality, cannot exclude high lateral ischemia.  No significant change from 09/28/2018  Assessment     ICD-10-CM   1. Pulmonary hypertension (HCC)  I27.20 EKG 12-Lead    6 minute walk    PCV ECHOCARDIOGRAM COMPLETE  2. Dyspnea on exertion  R06.00 PCV ECHOCARDIOGRAM COMPLETE  3. Primary hypertension  I10 diltiazem (CARDIZEM CD) 180 MG 24 hr capsule     Six Minute Walk - 09/09/19 1115      Six Minute Walk   Supplemental oxygen during test? No    Lap distance in meters  20 meters    Laps Completed  15    Partial lap (in meters) 0 meters    Baseline Heartrate 65    Baseline SPO2 99 %  Interval Oxygen Saturation and HR    2 Minute Oxygen Saturation % 99 %    2 Minute HR 85    4 Minute Oxygen Saturation % 99 %    4 Minute HR 90    6 Minute Oxygen Saturation % 99 %    6 Minute HR 98      End of Test Values   Heartrate 91    SPO2 99 %      Interpretation   Distance completed 300 meters    Tech Comments: Patient completed all laps without complications (without stopping)            Recommendations:   KAYLAANN MOUNTZ  is a 80 y.o. AA female  [place]  primary PH, hyperlipidemia, essential hypertension,  Chronic palpitations  suggestive of PVC, H/O Raynaud's disease, sclerodactyly, and scleroderma. She has been started on Adempas for pulm HTN and has chronic dyspnea, lumbar radiculopathy.  She is presently doing well, 6-minute walk test revealed stability, no clinical evidence heart failure, she is S/P bilateral vein sclerotherapy with resolution of leg edema and venous claudication.  Her blood pressure today is elevated, she was previously on amlodipine and probably discontinued due to leg edema, I would like to try diltiazem CD 180 mg daily which may also potentially help with pulmonary hypertension.  Lipids marginally abnormal, she is presently 79 years of age and has no known vascular disease, could consider low-dose high intensity statin.  I will review this again on her next office visit.  I will see her back in 3 months for follow-up of pulmonary hypertension.  Adrian Prows, MD, [facility] 09/10/2019, 8:17 AM [facility]. [place] Pager: [phone] Office: [phone]

## 2019-09-09 ENCOUNTER — Ambulatory Visit: Payer: Medicare Other | Admitting: Cardiology

## 2019-09-09 ENCOUNTER — Encounter: Payer: Self-pay | Admitting: Cardiology

## 2019-09-09 ENCOUNTER — Other Ambulatory Visit: Payer: Self-pay

## 2019-09-09 VITALS — BP 141/63 | HR 64 | Resp 16 | Ht 64.0 in | Wt 142.0 lb

## 2019-09-09 DIAGNOSIS — R0609 Other forms of dyspnea: Secondary | ICD-10-CM

## 2019-09-09 DIAGNOSIS — I10 Essential (primary) hypertension: Secondary | ICD-10-CM | POA: Diagnosis not present

## 2019-09-09 DIAGNOSIS — I272 Pulmonary hypertension, unspecified: Secondary | ICD-10-CM | POA: Diagnosis not present

## 2019-09-09 DIAGNOSIS — R0600 Dyspnea, unspecified: Secondary | ICD-10-CM

## 2019-09-09 MED ORDER — DILTIAZEM HCL ER COATED BEADS 180 MG PO CP24: 180.0000 mg | ORAL_CAPSULE | Freq: Every day | ORAL | 3 refills | Status: DC

## 2019-09-12 ENCOUNTER — Telehealth: Payer: Self-pay

## 2019-09-12 NOTE — Telephone Encounter (Signed)
The pt said no she hasn't been taking any vitamin d or calcium.    Dexa scan results: osteopenia of hip. This is thinning of the bone. Important to engage in weight-bearing exercises like walking 20-30 minutes three days per week. Is she taking calcium and vitamin d? Repeat test in 2 years.

## 2019-09-14 ENCOUNTER — Ambulatory Visit: Payer: Medicare Other

## 2019-09-14 ENCOUNTER — Ambulatory Visit: Payer: Medicare Other | Admitting: Internal Medicine

## 2019-09-14 ENCOUNTER — Other Ambulatory Visit: Payer: Self-pay

## 2019-09-14 ENCOUNTER — Ambulatory Visit: Payer: Medicare Other | Admitting: Nurse Practitioner

## 2019-09-14 DIAGNOSIS — R0609 Other forms of dyspnea: Secondary | ICD-10-CM

## 2019-09-14 DIAGNOSIS — I8312 Varicose veins of left lower extremity with inflammation: Secondary | ICD-10-CM | POA: Diagnosis not present

## 2019-09-14 DIAGNOSIS — R0600 Dyspnea, unspecified: Secondary | ICD-10-CM

## 2019-09-14 DIAGNOSIS — I272 Pulmonary hypertension, unspecified: Secondary | ICD-10-CM | POA: Diagnosis not present

## 2019-09-14 DIAGNOSIS — I8311 Varicose veins of right lower extremity with inflammation: Secondary | ICD-10-CM | POA: Diagnosis not present

## 2019-09-14 NOTE — Progress Notes (Signed)
NOrmal LVER, Moderate TR and normal PA pressure, normal RV function

## 2019-09-15 ENCOUNTER — Encounter: Payer: Self-pay | Admitting: Internal Medicine

## 2019-10-06 ENCOUNTER — Encounter: Payer: Medicare Other | Admitting: Internal Medicine

## 2019-11-09 ENCOUNTER — Other Ambulatory Visit: Payer: Self-pay

## 2019-11-09 ENCOUNTER — Ambulatory Visit (INDEPENDENT_AMBULATORY_CARE_PROVIDER_SITE_OTHER): Payer: Medicare Other

## 2019-11-09 ENCOUNTER — Encounter: Payer: Self-pay | Admitting: Nurse Practitioner

## 2019-11-09 ENCOUNTER — Ambulatory Visit (INDEPENDENT_AMBULATORY_CARE_PROVIDER_SITE_OTHER): Payer: Medicare Other | Admitting: Nurse Practitioner

## 2019-11-09 VITALS — BP 108/62 | HR 68 | Temp 98.0°F | Ht 63.8 in | Wt 142.8 lb

## 2019-11-09 DIAGNOSIS — I272 Pulmonary hypertension, unspecified: Secondary | ICD-10-CM

## 2019-11-09 DIAGNOSIS — Z1159 Encounter for screening for other viral diseases: Secondary | ICD-10-CM

## 2019-11-09 DIAGNOSIS — R232 Flushing: Secondary | ICD-10-CM

## 2019-11-09 DIAGNOSIS — Z79899 Other long term (current) drug therapy: Secondary | ICD-10-CM | POA: Diagnosis not present

## 2019-11-09 DIAGNOSIS — Z0000 Encounter for general adult medical examination without abnormal findings: Secondary | ICD-10-CM | POA: Diagnosis not present

## 2019-11-09 DIAGNOSIS — E785 Hyperlipidemia, unspecified: Secondary | ICD-10-CM | POA: Diagnosis not present

## 2019-11-09 LAB — POCT URINALYSIS DIPSTICK
Bilirubin, UA: NEGATIVE
Glucose, UA: NEGATIVE
Ketones, UA: NEGATIVE
Leukocytes, UA: NEGATIVE
Nitrite, UA: NEGATIVE
Protein, UA: NEGATIVE
Spec Grav, UA: 1.02 (ref 1.010–1.025)
Urobilinogen, UA: 1 E.U./dL
pH, UA: 5.5 (ref 5.0–8.0)

## 2019-11-09 LAB — POCT UA - MICROALBUMIN
Albumin/Creatinine Ratio, Urine, POC: <30
Creatinine, POC: 300 mg/dL
Microalbumin Ur, POC: 10 mg/L

## 2019-11-09 NOTE — Patient Instructions (Signed)
Nancy Lambert , Thank you for taking time to come for your Medicare Wellness Visit. I appreciate your ongoing commitment to your health goals. Please review the following plan we discussed and let me know if I can assist you in the future.   Screening recommendations/referrals: Colonoscopy: completed 02/13/2017 Mammogram: completed 03/18/2019 Bone Density: completed 08/31/2019 Recommended yearly ophthalmology/optometry visit for glaucoma screening and checkup Recommended yearly dental visit for hygiene and checkup  Vaccinations: Influenza vaccine: due Pneumococcal vaccine: completed 05/09/2015 Tdap vaccine: completed 05/29/2011 Shingles vaccine: discussed   Covid-19: 06/12/2019, 07/04/2019  Advanced directives: Advance directive discussed with you today. Even though you declined this today please call our office should you change your mind and we can give you the proper paperwork for you to fill out.   Conditions/risks identified: none  Next appointment: 05/16/2020 at 2:00 Follow up in one year for your annual wellness visit    Preventive Care 65 Years and Older, Female Preventive care refers to lifestyle choices and visits with your health care provider that can promote health and wellness. What does preventive care include?  A yearly physical exam. This is also called an annual well check.  Dental exams once or twice a year.  Routine eye exams. Ask your health care provider how often you should have your eyes checked.  Personal lifestyle choices, including:  Daily care of your teeth and gums.  Regular physical activity.  Eating a healthy diet.  Avoiding tobacco and drug use.  Limiting alcohol use.  Practicing safe sex.  Taking low-dose aspirin every day.  Taking vitamin and mineral supplements as recommended by your health care provider. What happens during an annual well check? The services and screenings done by your health care provider during your annual well check will  depend on your age, overall health, lifestyle risk factors, and family history of disease. Counseling  Your health care provider may ask you questions about your:  Alcohol use.  Tobacco use.  Drug use.  Emotional well-being.  Home and relationship well-being.  Sexual activity.  Eating habits.  History of falls.  Memory and ability to understand (cognition).  Work and work Statistician.  Reproductive health. Screening  You may have the following tests or measurements:  Height, weight, and BMI.  Blood pressure.  Lipid and cholesterol levels. These may be checked every 5 years, or more frequently if you are over 40 years old.  Skin check.  Lung cancer screening. You may have this screening every year starting at age 39 if you have a 30-pack-year history of smoking and currently smoke or have quit within the past 15 years.  Fecal occult blood test (FOBT) of the stool. You may have this test every year starting at age 41.  Flexible sigmoidoscopy or colonoscopy. You may have a sigmoidoscopy every 5 years or a colonoscopy every 10 years starting at age 33.  Hepatitis C blood test.  Hepatitis B blood test.  Sexually transmitted disease (STD) testing.  Diabetes screening. This is done by checking your blood sugar (glucose) after you have not eaten for a while (fasting). You may have this done every 1-3 years.  Bone density scan. This is done to screen for osteoporosis. You may have this done starting at age 84.  Mammogram. This may be done every 1-2 years. Talk to your health care provider about how often you should have regular mammograms. Talk with your health care provider about your test results, treatment options, and if necessary, the need for more tests.  Vaccines  Your health care provider may recommend certain vaccines, such as:  Influenza vaccine. This is recommended every year.  Tetanus, diphtheria, and acellular pertussis (Tdap, Td) vaccine. You may need a  Td booster every 10 years.  Zoster vaccine. You may need this after age 39.  Pneumococcal 13-valent conjugate (PCV13) vaccine. One dose is recommended after age 33.  Pneumococcal polysaccharide (PPSV23) vaccine. One dose is recommended after age 1. Talk to your health care provider about which screenings and vaccines you need and how often you need them. This information is not intended to replace advice given to you by your health care provider. Make sure you discuss any questions you have with your health care provider. Document Released: 04/13/2015 Document Revised: 12/05/2015 Document Reviewed: 01/16/2015 Elsevier Interactive Patient Education  2017 [place] Prevention in the Home Falls can cause injuries. They can happen to people of all ages. There are many things you can do to make your home safe and to help prevent falls. What can I do on the outside of my home?  Regularly fix the edges of walkways and driveways and fix any cracks.  Remove anything that might make you trip as you walk through a door, such as a raised step or threshold.  Trim any bushes or trees on the path to your home.  Use bright outdoor lighting.  Clear any walking paths of anything that might make someone trip, such as rocks or tools.  Regularly check to see if handrails are loose or broken. Make sure that both sides of any steps have handrails.  Any raised decks and porches should have guardrails on the edges.  Have any leaves, snow, or ice cleared regularly.  Use sand or salt on walking paths during winter.  Clean up any spills in your garage right away. This includes oil or grease spills. What can I do in the bathroom?  Use night lights.  Install grab bars by the toilet and in the tub and shower. Do not use towel bars as grab bars.  Use non-skid mats or decals in the tub or shower.  If you need to sit down in the shower, use a plastic, non-slip stool.  Keep the floor dry. Clean  up any water that spills on the floor as soon as it happens.  Remove soap buildup in the tub or shower regularly.  Attach bath mats securely with double-sided non-slip rug tape.  Do not have throw rugs and other things on the floor that can make you trip. What can I do in the bedroom?  Use night lights.  Make sure that you have a light by your bed that is easy to reach.  Do not use any sheets or blankets that are too big for your bed. They should not hang down onto the floor.  Have a firm chair that has side arms. You can use this for support while you get dressed.  Do not have throw rugs and other things on the floor that can make you trip. What can I do in the kitchen?  Clean up any spills right away.  Avoid walking on wet floors.  Keep items that you use a lot in easy-to-reach places.  If you need to reach something above you, use a strong step stool that has a grab bar.  Keep electrical cords out of the way.  Do not use floor polish or wax that makes floors slippery. If you must use wax, use non-skid floor wax.  Do not have throw rugs and other things on the floor that can make you trip. What can I do with my stairs?  Do not leave any items on the stairs.  Make sure that there are handrails on both sides of the stairs and use them. Fix handrails that are broken or loose. Make sure that handrails are as long as the stairways.  Check any carpeting to make sure that it is firmly attached to the stairs. Fix any carpet that is loose or worn.  Avoid having throw rugs at the top or bottom of the stairs. If you do have throw rugs, attach them to the floor with carpet tape.  Make sure that you have a light switch at the top of the stairs and the bottom of the stairs. If you do not have them, ask someone to add them for you. What else can I do to help prevent falls?  Wear shoes that:  Do not have high heels.  Have rubber bottoms.  Are comfortable and fit you well.  Are  closed at the toe. Do not wear sandals.  If you use a stepladder:  Make sure that it is fully opened. Do not climb a closed stepladder.  Make sure that both sides of the stepladder are locked into place.  Ask someone to hold it for you, if possible.  Clearly mark and make sure that you can see:  Any grab bars or handrails.  First and last steps.  Where the edge of each step is.  Use tools that help you move around (mobility aids) if they are needed. These include:  Canes.  Walkers.  Scooters.  Crutches.  Turn on the lights when you go into a dark area. Replace any light bulbs as soon as they burn out.  Set up your furniture so you have a clear path. Avoid moving your furniture around.  If any of your floors are uneven, fix them.  If there are any pets around you, be aware of where they are.  Review your medicines with your doctor. Some medicines can make you feel dizzy. This can increase your chance of falling. Ask your doctor what other things that you can do to help prevent falls. This information is not intended to replace advice given to you by your health care provider. Make sure you discuss any questions you have with your health care provider. Document Released: 01/11/2009 Document Revised: 08/23/2015 Document Reviewed: 04/21/2014 Elsevier Interactive Patient Education  2017 [employer].

## 2019-11-09 NOTE — Progress Notes (Signed)
I,Katawbba Wiggins,acting as a Education administrator for [employer], FNP.,have documented all relevant documentation on the behalf of Nancy Brine, FNP,as directed by  Nancy Brine, FNP while in the presence of Nancy Lambert, [place].  This visit occurred during the SARS-CoV-2 public health emergency.  Safety protocols were in place, including screening questions prior to the visit, additional usage of staff PPE, and extensive cleaning of exam room while observing appropriate contact time as indicated for disinfecting solutions.  Subjective:     Patient ID: Nancy Lambert , female    DOB: [birth year] , 79 y.o.   MRN: [id number]   Chief Complaint  Patient presents with  . Annual Exam    HPI  The patient is here today for a physical examination.   She continues to have right leg pain and she is planning to go to Dr. Antionette Fairy for the varicose veins.     Past Medical History:  Diagnosis Date  . Anxiety   . Cardiac arrhythmia   . Glaucoma   . Hypertension   . 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 Pulmonary hypertension ([place])   . Scleroderma ([place])   . 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 Breast cancer Mother   . Clotting disorder Mother   . Diabetes Mother   . Breast cancer Sister   . Clotting disorder Sister   . Prostate cancer Son   . Breast cancer Daughter   . Other Father        drowned  . Breast cancer Sister   . Breast cancer Other   . Colon cancer Neg Hx      Current Outpatient Medications:  .  aspirin EC 81 MG tablet, Take 81 mg by mouth daily., Disp: , Rfl:  .  diltiazem (CARDIZEM CD) 180 MG 24 hr capsule, Take 1 capsule (180 mg total) by mouth daily., Disp: 30 capsule, Rfl: 3 .  omeprazole (PRILOSEC) 40 MG capsule, TAKE 1 CAPSULE BY MOUTH ONCE DAILY 30  MINUTES  BEFORE  FIRST  MEAL  OF  THE  DAY, Disp: 90 capsule, Rfl: 4 .  Riociguat (ADEMPAS) 2 MG TABS, Take 1 tablet by  mouth 3 (three) times daily., Disp: 30 tablet, Rfl: 6   No Known Allergies    Hysterectomy. Negative for: breast discharge, breast lump(s), breast pain and breast self exam. Associated symptoms include abnormal vaginal bleeding. Pertinent negatives include abnormal bleeding (hematology), anxiety, decreased libido, depression, difficulty falling sleep, dyspareunia,  nocturia, sexual dysfunction, sleep disturbances, urinary incontinence, urinary urgency, vaginal discharge and vaginal itching. She reports having hot flashes. Diet regular.The patient states her exercise level is minimal.   The patient's tobacco use is:  Social History   Tobacco Use  Smoking Status Former Smoker  . Packs/day: 0.25  . Years: 5.00  . Pack years: 1.25  . Types: Cigarettes  . Quit date: 69  . Years since quitting: 50.6  Smokeless Tobacco Never Used  Tobacco Comment   very light use when she did smoke   She has been exposed to passive smoke. The patient's alcohol use is:  Social History   Substance and Sexual Activity  Alcohol Use Yes  . Alcohol/week: 3.0 - 5.0 standard drinks  . Types: 3 - 5 Standard drinks or equivalent per week   Comment: occasional   Additional information: Last pap hysterectomy.   Review of Systems  Constitutional: Negative.  HENT: Negative.   Eyes: Negative.   Respiratory: Negative.   Cardiovascular: Negative.   Gastrointestinal: Negative.   Endocrine: Negative.   Genitourinary: Negative.   Musculoskeletal: Negative.   Skin: Negative.   Allergic/Immunologic: Negative.   Neurological: Negative.   Hematological: Negative.   Psychiatric/Behavioral: Negative.      Today's Vitals   11/09/19 1426  BP: 108/62  Pulse: 68  Temp: 98 F (36.7 C)  TempSrc: Oral  Weight: 142 lb 12.8 oz (64.8 kg)  Height: 5' 3.8" (1.621 m)  PainSc: 8    Body mass index is 24.67 kg/m.  Wt Readings from Last 3 Encounters:  11/09/19 142 lb 12.8 oz (64.8 kg)  11/09/19 142 lb 12.8 oz  (64.8 kg)  09/09/19 142 lb (64.4 kg)   Objective:  Physical Exam Constitutional:      General: She is not in acute distress.    Appearance: Normal appearance. She is well-developed.  HENT:     Head: Normocephalic and atraumatic.     Right Ear: Hearing, tympanic membrane, ear canal and external ear normal. There is no impacted cerumen.     Left Ear: Hearing, tympanic membrane, ear canal and external ear normal. There is no impacted cerumen.     Nose:     Comments: Deferred  - masked    Mouth/Throat:     Comments: Deferred - masked Eyes:     General: Lids are normal.     Extraocular Movements: Extraocular movements intact.     Conjunctiva/sclera: Conjunctivae normal.     Pupils: Pupils are equal, round, and reactive to light.     Funduscopic exam:    Right eye: No papilledema.        Left eye: No papilledema.  Neck:     Thyroid: No thyroid mass.     Vascular: No carotid bruit.  Cardiovascular:     Rate and Rhythm: Normal rate and regular rhythm.     Pulses: Normal pulses.     Heart sounds: Normal heart sounds. No murmur heard.   Pulmonary:     Effort: Pulmonary effort is normal.     Breath sounds: Normal breath sounds.  Abdominal:     General: Abdomen is flat. Bowel sounds are normal. There is no distension.     Palpations: Abdomen is soft.     Tenderness: There is no abdominal tenderness.  Genitourinary:    Rectum: Guaiac result negative.  Musculoskeletal:        General: No swelling. Normal range of motion.     Cervical back: Full passive range of motion without pain, normal range of motion and neck supple.     Right lower leg: No edema.     Left lower leg: No edema.  Skin:    General: Skin is warm and dry.     Capillary Refill: Capillary refill takes less than 2 seconds.  Neurological:     General: No focal deficit present.     Mental Status: She is alert and oriented to person, place, and time.     Cranial Nerves: No cranial nerve deficit.     Sensory: No  sensory deficit.  Psychiatric:        Mood and Affect: Mood normal.        Behavior: Behavior normal.        Thought Content: Thought content normal.        Judgment: Judgment normal.         Assessment And Plan:  1. Routine general medical examination at a health care facility  Pt's annual wellness exam was performed and geriatric assessment reviewed.   Pt has no new identiafble wellness concerns at this time.   WIll obtain routine labs.   Will obtain UA and micro.   Behavior modifications discussed and diet history reviewed. Pt will continue to exercise regularly and modify diet, with low GI, plant based foods and decrease food intake of processed foods.   Recommend intake of daily multivitamin, Vitamin D, and calcium.  Recommend mammogram and colonoscopy for preventive screenings, as well as recommend immunizations that include TDAP - CBC - Lipid panel  2. Need for hepatitis C screening test  Will check Hepatitis C screening due to recent recommendations to screen all adults 18 years and older  3. Pulmonary hypertension (HCC)  Chronic, well controlled  Continue with current medications - CMP14+EGFR  4. Hot flashes  Will check thyroid levels  Encouraged to avoid sugary foods especially at night.  - CBC - TSH     Patient was given opportunity to ask questions. Patient verbalized understanding of the plan and was able to repeat key elements of the plan. All questions were answered to their satisfaction.    Teola Bradley, FNP, have reviewed all documentation for this visit. The documentation on 11/27/19 for the exam, diagnosis, procedures, and orders are all accurate and complete.  THE PATIENT IS ENCOURAGED TO PRACTICE SOCIAL DISTANCING DUE TO THE COVID-19 PANDEMIC.

## 2019-11-09 NOTE — Patient Instructions (Signed)
Health Maintenance After Age 77 After age 77, you are at a higher risk for certain long-term diseases and infections as well as injuries from falls. Falls are a major cause of broken bones and head injuries in people who are older than age 77. Getting regular preventive care can help to keep you healthy and well. Preventive care includes getting regular testing and making lifestyle changes as recommended by your health care provider. Talk with your health care provider about:  Which screenings and tests you should have. A screening is a test that checks for a disease when you have no symptoms.  A diet and exercise plan that is right for you. What should I know about screenings and tests to prevent falls? Screening and testing are the best ways to find a health problem early. Early diagnosis and treatment give you the best chance of managing medical conditions that are common after age 77. Certain conditions and lifestyle choices may make you more likely to have a fall. Your health care provider may recommend:  Regular vision checks. Poor vision and conditions such as cataracts can make you more likely to have a fall. If you wear glasses, make sure to get your prescription updated if your vision changes.  Medicine review. Work with your health care provider to regularly review all of the medicines you are taking, including over-the-counter medicines. Ask your health care provider about any side effects that may make you more likely to have a fall. Tell your health care provider if any medicines that you take make you feel dizzy or sleepy.  Osteoporosis screening. Osteoporosis is a condition that causes the bones to get weaker. This can make the bones weak and cause them to break more easily.  Blood pressure screening. Blood pressure changes and medicines to control blood pressure can make you feel dizzy.  Strength and balance checks. Your health care provider may recommend certain tests to check your  strength and balance while standing, walking, or changing positions.  Foot health exam. Foot pain and numbness, as well as not wearing proper footwear, can make you more likely to have a fall.  Depression screening. You may be more likely to have a fall if you have a fear of falling, feel emotionally low, or feel unable to do activities that you used to do.  Alcohol use screening. Using too much alcohol can affect your balance and may make you more likely to have a fall. What actions can I take to lower my risk of falls? General instructions  Talk with your health care provider about your risks for falling. Tell your health care provider if: ? You fall. Be sure to tell your health care provider about all falls, even ones that seem minor. ? You feel dizzy, sleepy, or off-balance.  Take over-the-counter and prescription medicines only as told by your health care provider. These include any supplements.  Eat a healthy diet and maintain a healthy weight. A healthy diet includes low-fat dairy products, low-fat (lean) meats, and fiber from whole grains, beans, and lots of fruits and vegetables. Home safety  Remove any tripping hazards, such as rugs, cords, and clutter.  Install safety equipment such as grab bars in bathrooms and safety rails on stairs.  Keep rooms and walkways well-lit. Activity   Follow a regular exercise program to stay fit. This will help you maintain your balance. Ask your health care provider what types of exercise are appropriate for you.  If you need a cane or   walker, use it as recommended by your health care provider.  Wear supportive shoes that have nonskid soles. Lifestyle  Do not drink alcohol if your health care provider tells you not to drink.  If you drink alcohol, limit how much you have: ? 0-1 drink a day for women. ? 0-2 drinks a day for men.  Be aware of how much alcohol is in your drink. In the [place] one drink equals one typical bottle of beer (12  oz), one-half glass of wine (5 oz), or one shot of hard liquor (1 oz).  Do not use any products that contain nicotine or tobacco, such as cigarettes and e-cigarettes. If you need help quitting, ask your health care provider. Summary  Having a healthy lifestyle and getting preventive care can help to protect your health and wellness after age 77.  Screening and testing are the best way to find a health problem early and help you avoid having a fall. Early diagnosis and treatment give you the best chance for managing medical conditions that are more common for people who are older than age 77.  Falls are a major cause of broken bones and head injuries in people who are older than age 77. Take precautions to prevent a fall at home.  Work with your health care provider to learn what changes you can make to improve your health and wellness and to prevent falls. This information is not intended to replace advice given to you by your health care provider. Make sure you discuss any questions you have with your health care provider. Document Revised: 07/08/2018 Document Reviewed: 01/28/2017 Elsevier Patient Education  2020 [employer]  

## 2019-11-09 NOTE — Progress Notes (Signed)
This visit occurred during the SARS-CoV-2 public health emergency.  Safety protocols were in place, including screening questions prior to the visit, additional usage of staff PPE, and extensive cleaning of exam room while observing appropriate contact time as indicated for disinfecting solutions.  Subjective:   Kataryna Mcquilkin is a 79 y.o. female who presents for Medicare Annual (Subsequent) preventive examination.  Review of Systems     Cardiac Risk Factors include: advanced age (>64men, >69 women);hypertension;sedentary lifestyle     Objective:    Today's Vitals   11/09/19 1405 11/09/19 1416  BP: 108/62   Pulse: 68   Temp: 98 F (36.7 C)   TempSrc: Oral   Weight: 142 lb 12.8 oz (64.8 kg)   Height: 5' 3.8" (1.621 m)   PainSc:  8    Body mass index is 24.67 kg/m.  Advanced Directives 11/09/2019 03/30/2019 09/07/2018 02/13/2017 01/27/2017 07/24/2015  Does Patient Have a Medical Advance Directive? No No No No No No  Would patient like information on creating a medical advance directive? No - Patient declined No - Patient declined No - Patient declined - - No - patient declined information    Current Medications (verified) Outpatient Encounter Medications as of 11/09/2019  Medication Sig  . aspirin EC 81 MG tablet Take 81 mg by mouth daily.  Marland Kitchen diltiazem (CARDIZEM CD) 180 MG 24 hr capsule Take 1 capsule (180 mg total) by mouth daily.  Marland Kitchen omeprazole (PRILOSEC) 40 MG capsule TAKE 1 CAPSULE BY MOUTH ONCE DAILY 30  MINUTES  BEFORE  FIRST  MEAL  OF  THE  DAY  . Riociguat (ADEMPAS) 2 MG TABS Take 1 tablet by mouth 3 (three) times daily.   No facility-administered encounter medications on file as of 11/09/2019.    Allergies (verified) Patient has no known allergies.   History: Past Medical History:  Diagnosis Date  . Anxiety   . Cardiac arrhythmia   . Glaucoma   . Hypertension   . 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 Pulmonary hypertension ([place])   . Scleroderma  ([place])   . Status post ablation of incompetent vein using laser bilateral leg 01/2019   Past Surgical History:  Procedure Laterality Date  . CARDIAC CATHETERIZATION N/A 07/24/2015   Procedure: Right Heart Cath;  Surgeon: Adrian Prows, MD;  Location: [facility];  Service: Cardiovascular;  Laterality: N/A;  . steroid shots  2020   for pain  . VESICOVAGINAL FISTULA CLOSURE W/ TAH     Family History  Problem Relation Age of Onset  . Breast cancer Mother   . Clotting disorder Mother   . Diabetes Mother   . Breast cancer Sister   . Clotting disorder Sister   . Prostate cancer Son   . Breast cancer Daughter   . Other Father        drowned  . Breast cancer Sister   . Breast cancer Other   . Colon cancer Neg Hx    Social History   Socioeconomic History  . Marital status: Widowed    Spouse name: Not on file  . Number of children: 4  . Years of education: Not on file  . Highest education level: Not on file  Occupational History  . Occupation: Retired  Tobacco Use  . Smoking status: Former Smoker    Packs/day: 0.25    Years: 5.00    Pack years: 1.25    Types: Cigarettes    Quit date: 1971    Years since quitting:  50.6  . Smokeless tobacco: Never Used  . Tobacco comment: very light use when she did smoke  Vaping Use  . Vaping Use: Never used  Substance and Sexual Activity  . Alcohol use: Yes    Alcohol/week: 3.0 - 5.0 standard drinks    Types: 3 - 5 Standard drinks or equivalent per week    Comment: occasional  . Drug use: No  . Sexual activity: Not Currently  Other Topics Concern  . Not on file  Social History Narrative   Lives at home with her daughter   Right handed   Social Determinants of Health   Financial Resource Strain: Low Risk   . Difficulty of Paying Living Expenses: Not hard at all  Food Insecurity: No Food Insecurity  . Worried About Charity fundraiser in the Last Year: Never true  . Ran Out of Food in the Last Year: Never true  Transportation  Needs: No Transportation Needs  . Lack of Transportation (Medical): No  . Lack of Transportation (Non-Medical): No  Physical Activity: Inactive  . Days of Exercise per Week: 0 days  . Minutes of Exercise per Session: 0 min  Stress: No Stress Concern Present  . Feeling of Stress : Not at all  Social Connections:   . Frequency of Communication with Friends and Family:   . Frequency of Social Gatherings with Friends and Family:   . Attends Religious Services:   . Active Member of Clubs or Organizations:   . Attends Archivist Meetings:   Marland Kitchen Marital Status:     Tobacco Counseling Counseling given: Not Answered Comment: very light use when she did smoke   Clinical Intake:  Pre-visit preparation completed: Yes  Pain : 0-10 Pain Score: 8  Pain Type: Chronic pain Pain Location: Leg Pain Orientation: Right, Left Pain Descriptors / Indicators: Other (Comment) (annoying) Pain Onset: More than a month ago Pain Frequency: Intermittent Pain Relieving Factors: alcohol  Pain Relieving Factors: alcohol  Nutritional Status: BMI of 19-24  Normal Nutritional Risks: None Diabetes: No  How often do you need to have someone help you when you read instructions, pamphlets, or other written materials from your doctor or pharmacy?: 1 - Never What is the last grade level you completed in school?: 12th grade  Diabetic? no  Interpreter Needed?: No  Information entered by :: NAllen LPN   Activities of Daily Living In your present state of health, do you have any difficulty performing the following activities: 11/09/2019  Hearing? N  Vision? N  Difficulty concentrating or making decisions? N  Walking or climbing stairs? Y  Dressing or bathing? N  Doing errands, shopping? N  Preparing Food and eating ? N  Using the Toilet? N  In the past six months, have you accidently leaked urine? N  Do you have problems with loss of bowel control? N  Managing your Medications? N  Managing  your Finances? N  Housekeeping or managing your Housekeeping? N  Some recent data might be hidden    Patient Care Team: Glendale Chard, MD as PCP - General (Internal Medicine)  Indicate any recent Medical Services you may have received from other than [facility] providers in the past year (date may be approximate).     Assessment:   This is a routine wellness examination for [place].  Hearing/Vision screen  Hearing Screening   125Hz  250Hz  500Hz  1000Hz  2000Hz  3000Hz  4000Hz  6000Hz  8000Hz   Right ear:  Left ear:           Vision Screening Comments: Regular eye exams, Dr. Katy Fitch  Dietary issues and exercise activities discussed: Current Exercise Habits: The patient does not participate in regular exercise at present  Goals    . Patient Stated     No goals    . Patient Stated     11/09/2019, no goals      Depression Screen PHQ 2/9 Scores 11/09/2019 12/22/2018 10/28/2018 09/07/2018 09/07/2018 03/08/2018  PHQ - 2 Score 0 0 0 0 6 1  PHQ- 9 Score - - - 1 11 3   Exception Documentation - - - - Other- indicate reason in comment box -  Not completed - - - - patient's daughter passed away -    Fall Risk Fall Risk  11/09/2019 12/22/2018 10/28/2018 09/07/2018 09/07/2018  Falls in the past year? 1 1 0 0 0  Comment fell off chair - - - -  Number falls in past yr: 0 1 - - -  Injury with Fall? 0 1 - - -  Risk for fall due to : Medication side effect - - - -  Risk for fall due to: Comment - - - - -  Follow up Falls evaluation completed;Education provided;Falls prevention discussed - - Education provided;Falls prevention discussed -    Any stairs in or around the home? Yes  If so, are there any without handrails? No  Home free of loose throw rugs in walkways, pet beds, electrical cords, etc? Yes  Adequate lighting in your home to reduce risk of falls? Yes   ASSISTIVE DEVICES UTILIZED TO PREVENT FALLS:  Life alert? No  Use of a cane, walker or w/c? No  Grab bars in the bathroom? No  Shower  chair or bench in shower? No  Elevated toilet seat or a handicapped toilet? No   TIMED UP AND GO:  Was the test performed? No .     Cognitive Function:     6CIT Screen 11/09/2019 09/07/2018  What Year? 0 points 0 points  What month? 0 points 0 points  What time? 0 points 0 points  Count back from 20 2 points 0 points  Months in reverse 4 points 4 points  Repeat phrase 2 points 0 points  Total Score 8 4    Immunizations Immunization History  Administered Date(s) Administered  . Influenza Split 12/30/2014  . Influenza, High Dose Seasonal PF 01/28/2018  . Influenza,inj,Quad PF,6+ Mos 01/30/2016  . Influenza-Unspecified 01/29/2013  . PFIZER SARS-COV-2 Vaccination 06/12/2019, 07/04/2019  . Pneumococcal Conjugate-13 05/09/2015    TDAP status: Up to date Flu Vaccine status: Up to date Pneumococcal vaccine status: Up to date Covid-19 vaccine status: Completed vaccines  Qualifies for Shingles Vaccine? Yes   Zostavax completed No   Shingrix Completed?: No.    Education has been provided regarding the importance of this vaccine. Patient has been advised to call insurance company to determine out of pocket expense if they have not yet received this vaccine. Advised may also receive vaccine at local pharmacy or Health Dept. Verbalized acceptance and understanding.  Screening Tests Health Maintenance  Topic Date Due  . INFLUENZA VACCINE  06/28/2020 (Originally 10/30/2019)  . COLONOSCOPY  02/14/2020  . TETANUS/TDAP  05/28/2021  . DEXA SCAN  Completed  . COVID-19 Vaccine  Completed  . Hepatitis C Screening  Completed  . PNA vac Low Risk Adult  Completed    Health Maintenance  There are no preventive care reminders to display  for this patient.  Colorectal cancer screening: Completed 02/13/2017. Repeat every 10 years Mammogram status: Completed 03/18/2019. Repeat every year Bone Density status: Completed 08/31/2019.   Lung Cancer Screening: (Low Dose CT Chest recommended if Age  23-80 years, 30 pack-year currently smoking OR have quit w/in 15years.) does not qualify.   Lung Cancer Screening Referral: no   Additional Screening:  Hepatitis C Screening: does not qualify;   Vision Screening: Recommended annual ophthalmology exams for early detection of glaucoma and other disorders of the eye. Is the patient up to date with their annual eye exam?  Yes  Who is the provider or what is the name of the office in which the patient attends annual eye exams? Dr. Katy Fitch If pt is not established with a provider, would they like to be referred to a provider to establish care? No .   Dental Screening: Recommended annual dental exams for proper oral hygiene  Community Resource Referral / Chronic Care Management: CRR required this visit?  No   CCM required this visit?  No      Plan:     I have personally reviewed and noted the following in the patient's chart:   . Medical and social history . Use of alcohol, tobacco or illicit drugs  . Current medications and supplements . Functional ability and status . Nutritional status . Physical activity . Advanced directives . List of other physicians . Hospitalizations, surgeries, and ER visits in previous 12 months . Vitals . Screenings to include cognitive, depression, and falls . Referrals and appointments  In addition, I have reviewed and discussed with patient certain preventive protocols, quality metrics, and best practice recommendations. A written personalized care plan for preventive services as well as general preventive health recommendations were provided to patient.     Kellie Simmering, LPN   [id number]   Nurse Notes:

## 2019-11-10 LAB — CMP14+EGFR
ALT: 16 IU/L (ref 0–32)
AST: 22 IU/L (ref 0–40)
Albumin/Globulin Ratio: 1.8 (ref 1.2–2.2)
Albumin: 4.4 g/dL (ref 3.7–4.7)
Alkaline Phosphatase: 86 IU/L (ref 48–121)
BUN/Creatinine Ratio: 15 (ref 12–28)
BUN: 12 mg/dL (ref 8–27)
Bilirubin Total: 0.5 mg/dL (ref 0.0–1.2)
CO2: 25 mmol/L (ref 20–29)
Calcium: 9.9 mg/dL (ref 8.7–10.3)
Chloride: 103 mmol/L (ref 96–106)
Creatinine, Ser: 0.8 mg/dL (ref 0.57–1.00)
GFR calc Af Amer: 83 mL/min/{1.73_m2} (ref >59)
GFR calc non Af Amer: 72 mL/min/{1.73_m2} (ref >59)
Globulin, Total: 2.4 g/dL (ref 1.5–4.5)
Glucose: 81 mg/dL (ref 65–99)
Potassium: 4.5 mmol/L (ref 3.5–5.2)
Sodium: 142 mmol/L (ref 134–144)
Total Protein: 6.8 g/dL (ref 6.0–8.5)

## 2019-11-10 LAB — CBC
Hematocrit: 38.5 % (ref 34.0–46.6)
Hemoglobin: 12.8 g/dL (ref 11.1–15.9)
MCH: 30.9 pg (ref 26.6–33.0)
MCHC: 33.2 g/dL (ref 31.5–35.7)
MCV: 93 fL (ref 79–97)
Platelets: 275 10*3/uL (ref 150–450)
RBC: 4.14 x10E6/uL (ref 3.77–5.28)
RDW: 13.3 % (ref 11.7–15.4)
WBC: 4.3 10*3/uL (ref 3.4–10.8)

## 2019-11-10 LAB — TSH: TSH: 2.11 u[IU]/mL (ref 0.450–4.500)

## 2019-11-11 LAB — LIPID PANEL
Chol/HDL Ratio: 2.7 ratio (ref 0.0–4.4)
Cholesterol, Total: 159 mg/dL (ref 100–199)
HDL: 59 mg/dL (ref >39)
LDL Chol Calc (NIH): 89 mg/dL (ref 0–99)
Triglycerides: 53 mg/dL (ref 0–149)
VLDL Cholesterol Cal: 11 mg/dL (ref 5–40)

## 2019-11-11 LAB — SPECIMEN STATUS REPORT

## 2019-12-06 DIAGNOSIS — I8312 Varicose veins of left lower extremity with inflammation: Secondary | ICD-10-CM | POA: Diagnosis not present

## 2019-12-06 DIAGNOSIS — I8311 Varicose veins of right lower extremity with inflammation: Secondary | ICD-10-CM | POA: Diagnosis not present

## 2019-12-08 ENCOUNTER — Ambulatory Visit: Payer: Medicare Other | Admitting: Cardiology

## 2019-12-20 DIAGNOSIS — I8312 Varicose veins of left lower extremity with inflammation: Secondary | ICD-10-CM | POA: Diagnosis not present

## 2019-12-20 DIAGNOSIS — I8311 Varicose veins of right lower extremity with inflammation: Secondary | ICD-10-CM | POA: Diagnosis not present

## 2019-12-20 DIAGNOSIS — R600 Localized edema: Secondary | ICD-10-CM | POA: Diagnosis not present

## 2020-01-05 ENCOUNTER — Other Ambulatory Visit: Payer: Self-pay

## 2020-01-05 ENCOUNTER — Encounter: Payer: Self-pay | Admitting: Cardiology

## 2020-01-05 ENCOUNTER — Ambulatory Visit: Payer: Medicare Other | Admitting: Cardiology

## 2020-01-05 VITALS — BP 130/70 | HR 61 | Resp 16 | Ht 63.8 in | Wt 138.2 lb

## 2020-01-05 DIAGNOSIS — R0600 Dyspnea, unspecified: Secondary | ICD-10-CM

## 2020-01-05 DIAGNOSIS — I272 Pulmonary hypertension, unspecified: Secondary | ICD-10-CM

## 2020-01-05 DIAGNOSIS — I10 Essential (primary) hypertension: Secondary | ICD-10-CM

## 2020-01-05 DIAGNOSIS — R0609 Other forms of dyspnea: Secondary | ICD-10-CM | POA: Diagnosis not present

## 2020-01-05 NOTE — Progress Notes (Signed)
Primary Physician/Referring:  Glendale Chard, MD  Patient ID: Nancy Lambert, female    DOB: 05-Aug-1940, 79 y.o.   MRN: [id number]  Chief Complaint  Patient presents with  . Pulmonary Hypertension  . Follow-up    3 month   HPI:    Nancy Lambert  is a 79 y.o. 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 She is on Adempas for pulm HTN and has chronic dyspnea, lumbar radiculopathy.   Patient presents for 3 month follow up. At last visit patient was started on diltiazem CD 180 mg daily, however she reports she has only been taking it 2 times per week sporadically.  States she continues to have occasional brief palpitations 2-3 times per month, which are worse with stress in her life.  Dyspnea is stable and she denies leg edema, chest pain orthopnea, PND.  She does not report her blood pressure on a regular basis at home.  Of note patient expressed that she currently has high stress level and anxiety related to disagreements with her family.   Past Medical History:  Diagnosis Date  . Anxiety   . Cardiac arrhythmia   . Glaucoma   . Hypertension   . 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[place])   . Pulmonary hypertension ([place])   . Scleroderma ([place])   . Status post ablation of incompetent vein using laser bilateral leg 01/2019   Past Surgical History:  Procedure Laterality Date  . CARDIAC CATHETERIZATION N/A 07/24/2015   Procedure: Right Heart Cath;  Surgeon: Adrian Prows, MD;  Location: [facility];  Service: Cardiovascular;  Laterality: N/A;  . steroid shots  2020   for pain  . VESICOVAGINAL FISTULA CLOSURE W/ TAH     Family History  Problem Relation Age of Onset  . Breast cancer Mother   . Clotting disorder Mother   . Diabetes Mother   . Breast cancer Sister   . Clotting disorder Sister   . Prostate cancer Son   . Breast cancer Daughter   . Other  Father        drowned  . Breast cancer Sister   . Breast cancer Other   . Colon cancer Neg Hx     Social History   Tobacco Use  . Smoking status: Former Smoker    Packs/day: 0.25    Years: 5.00    Pack years: 1.25    Types: Cigarettes    Quit date: 1971    Years since quitting: 50.8  . Smokeless tobacco: Never Used  . Tobacco comment: very light use when she did smoke  Substance Use Topics  . Alcohol use: Yes    Alcohol/week: 3.0 - 5.0 standard drinks    Types: 3 - 5 Standard drinks or equivalent per week    Comment: occasional   Marital Status: Widowed  ROS  Review of Systems  Cardiovascular: Positive for dyspnea on exertion (stable) and palpitations (occasional). Negative for leg swelling and syncope.  Musculoskeletal: Positive for arthritis, back pain and joint pain.  Gastrointestinal: Negative for melena.   Objective  Blood pressure 130/70, pulse 61, resp. rate 16, height 5' 3.8" (1.621 m), weight 138 lb 3.2 oz (62.7 kg), SpO2 96 %.  Vitals with BMI 01/05/2020 11/09/2019 11/09/2019  Height 5' 3.8" 5' 3.8" 5' 3.8"  Weight 138 lbs 3 oz 142 lbs 13 oz 142 lbs 13 oz  BMI 23.86 10.27 25.36  Systolic  726 203 559  Diastolic 70 62 62  Pulse 61 68 68     Physical Exam Vitals reviewed.  Constitutional:      General: She is not in acute distress.    Appearance: She is well-developed.  Neck:     Vascular: No carotid bruit.  Cardiovascular:     Rate and Rhythm: Normal rate and regular rhythm.     Pulses: Intact distal pulses.          Carotid pulses are 2+ on the right side and 2+ on the left side.      Radial pulses are 2+ on the right side and 2+ on the left side.       Dorsalis pedis pulses are 1+ on the right side and 1+ on the left side.       Posterior tibial pulses are 0 on the right side and 0 on the left side.     Heart sounds: Murmur heard.  Harsh midsystolic murmur is present with a grade of 2/6 at the upper right sternal border.  No gallop.      Comments: S1  normal, S2 is  Loud and accentuated. No leg edema, no JVD.  Pulmonary:     Effort: Pulmonary effort is normal. No accessory muscle usage.     Breath sounds: Normal breath sounds.  Abdominal:     General: Bowel sounds are normal.     Palpations: Abdomen is soft. There is hepatomegaly.  Skin:    Comments: Abnormally dry due to scleroderma    Laboratory examination:   Recent Labs    01/11/19 1246 03/30/19 1831 11/09/19 1518  NA 142 141 142  K 4.4 3.3* 4.5  CL 103 103 103  CO2 26 29 25   GLUCOSE 76 91 81  BUN 9 6* 12  CREATININE 0.70 0.58 0.80  CALCIUM 10.2 9.2 9.9  GFRNONAA 84 >60 72  GFRAA 97 >60 83   CrCl cannot be calculated (Patient's most recent lab result is older than the maximum 21 days allowed.).  CMP Latest Ref Rng & Units 11/09/2019 03/30/2019 01/11/2019  Glucose 65 - 99 mg/dL 81 91 76  BUN 8 - 27 mg/dL 12 6(L) 9  Creatinine 0.57 - 1.00 mg/dL 0.80 0.58 0.70  Sodium 134 - 144 mmol/L 142 141 142  Potassium 3.5 - 5.2 mmol/L 4.5 3.3(L) 4.4  Chloride 96 - 106 mmol/L 103 103 103  CO2 20 - 29 mmol/L 25 29 26   Calcium 8.7 - 10.3 mg/dL 9.9 9.2 10.2  Total Protein 6.0 - 8.5 g/dL 6.8 - 6.7  Total Bilirubin 0.0 - 1.2 mg/dL 0.5 - 0.6  Alkaline Phos 48 - 121 IU/L 86 - 51  AST 0 - 40 IU/L 22 - 15  ALT 0 - 32 IU/L 16 - 11   CBC Latest Ref Rng & Units 11/09/2019 03/30/2019 01/11/2019  WBC 3.4 - 10.8 x10E3/uL 4.3 3.5(L) 3.8  Hemoglobin 11.1 - 15.9 g/dL 12.8 12.5 13.0  Hematocrit 34.0 - 46.6 % 38.5 40.1 40.1  Platelets 150 - 450 x10E3/uL 275 251 293    Lipid Panel    Component Value Date/Time   CHOL 159 11/09/2019 1518   TRIG 53 11/09/2019 1518   HDL 59 11/09/2019 1518   CHOLHDL 2.7 11/09/2019 1518   LDLCALC 89 11/09/2019 1518    HEMOGLOBIN A1C No results found for: HGBA1C, MPG TSH Recent Labs    06/06/19 0847 11/09/19 1518  TSH 1.200 2.110    Medications and allergies  No Known Allergies   Current Outpatient Medications  Medication Instructions  .  aspirin EC 81 mg, Oral, Daily  . diltiazem (CARDIZEM CD) 180 mg, Oral, Daily  . omeprazole (PRILOSEC) 40 MG capsule TAKE 1 CAPSULE BY MOUTH ONCE DAILY 30  MINUTES  BEFORE  FIRST  MEAL  OF  THE  DAY  . Riociguat (ADEMPAS) 2 MG TABS 1 tablet, Oral, 3 times daily    There are no discontinued medications.  Radiology:   No results found.  Cardiac Studies:   Lexiscan myoview stress test 06/18/2015: 1. Resting EKG demonstrates normal sinus rhythm, left axis deviation, cannot exclude inferior infarct old, Anterior infarct old.  Stress EKG was negative for myocardial ischemia.  Patient exercised for 4 minutes and 5 seconds and achieved 5.93 Mets.  There are frequent PVCs in the recovery phase of the stress test.  Stress terminated due to achieving target heart rate, 86% of MPHR.  Stress symptoms included dyspnea. 2. Myocardial perfusion imaging is normal. Overall left ventricular systolic function was normal without regional wall motion abnormalities. The left ventricular ejection fraction was 62%.  Right heart cath 07/24/2015: Right Heart Pressure:  RA A Wave  13 mmHg,   RA V Wave  9 mmHg,   RA Mean  8 mmHg  RV Systolic Pressure  48 mmHg,   RV Diastolic Pressure  4 mmHg, RV EDP  11 mmHg  PA Systolic Pressure  48 mmHg,   PA Diastolic Pressure  14 mmHg,   PA Mean  29 mmHg. PW A Wave  19 mmHg,   PW V Wave  17 mmHg,   PW Mean  15 mmHg  QP/QS  1    Total pulmonary vascular resistance 7 Wood units.   Impression: Mild to moderate pulmonary hypertension. Wedge pressure although normal, in the upper end of spectrum suggesting the pulmonary hypertension could also be related to mild LV diastolic heart failure. Findings probably more consistent with primary pulmonary hypertension. Clinical correlation recommended.   Lower Venous DVT Study 07/14/2019: BILATERAL:  - No evidence of deep vein thrombosis seen in the lower extremities, bilaterally.   Echocardiogram 09/14/2019:  Left ventricle cavity is normal  in size. Moderate concentric hypertrophy of the left ventricle. Normal global wall motion. Normal LV systolic function with EF 67%. Doppler evidence of grade I (impaired) diastolic dysfunction, normal LAP. Calculated EF 67%.  Left atrial cavity is moderately dilated.  Trileaflet aortic valve with mild aortic valve leaflet calcification. Mild aortic stenosis. Aortic valve mean gradient of 7 mmHg, Vmax of 1.8 m/s.  Calculated aortic valve area by continuity equation is 2.1 cm.  Moderate tricuspid regurgitation. Estimated pulmonary artery systolic pressure is 28 mmHg.  Small circumferential pericardial effusion without hemodynamic significance. Compared to echo 02/07/2019, MR not noted, PA pressure 28 from 35  EKG  EKG 09/09/2019: Sinus bradycardia at rate of 58 bpm, right atrial enlargement, left axis deviation, left anterior fascicular block.  Poor R wave progression, cannot exclude anteroseptal infarct old.  IVCD, LVH with repolarization abnormality, cannot exclude high lateral ischemia.  No significant change from 09/28/2018  Assessment     ICD-10-CM   1. Pulmonary hypertension (HCC)  I27.20   2. Primary hypertension  I10   3. Dyspnea on exertion  R06.00        Recommendations:   SHERREE SHANKMAN  is a 79 y.o. 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 She is on  Adempas for pulm HTN and has chronic dyspnea, lumbar radiculopathy.  Patient is presently doing well without clinical evidence of heart failure.  Dyspnea stable and she is without leg edema presently.  Her blood pressure was initially elevated, however upon recheck blood pressure was 130/70.  She also continues to have occasional palpitations.  Discussed with patient taking diltiazem 180 mg daily will likely improve palpitations as well as blood pressure control.  Also discussed that it is important in light of her  history of Raynaud's disease to be taking the diltiazem every day, especially heading into cooler weather.  Patient expressed understanding and agreed.  She will also start checking her blood pressure at home and keep a log to bring with her at her next visit.    Reviewed lipid profile testing 8/11/202, lipids are well controlled and patient prefers not to start statin therapy at this time.  Follow-up in 6 months for hypertension and pulmonary hypertension with 6-minute walk test.  Patient was seen in collaboration with Dr. Einar Gip. He also reviewed patient's chart and Dr. Einar Gip is in agreement of the plan.    Alethia Berthold, PA-C 01/05/2020, 10:32 AM Office: [phone]

## 2020-02-01 DIAGNOSIS — I7300 Raynaud's syndrome without gangrene: Secondary | ICD-10-CM | POA: Diagnosis not present

## 2020-02-01 DIAGNOSIS — K219 Gastro-esophageal reflux disease without esophagitis: Secondary | ICD-10-CM | POA: Diagnosis not present

## 2020-02-01 DIAGNOSIS — I272 Pulmonary hypertension, unspecified: Secondary | ICD-10-CM | POA: Diagnosis not present

## 2020-02-01 DIAGNOSIS — M349 Systemic sclerosis, unspecified: Secondary | ICD-10-CM | POA: Diagnosis not present

## 2020-03-12 DIAGNOSIS — Z20822 Contact with and (suspected) exposure to covid-19: Secondary | ICD-10-CM | POA: Diagnosis not present

## 2020-03-12 DIAGNOSIS — U071 COVID-19: Secondary | ICD-10-CM | POA: Diagnosis not present

## 2020-04-12 ENCOUNTER — Other Ambulatory Visit: Payer: Self-pay | Admitting: Internal Medicine

## 2020-04-12 DIAGNOSIS — Z1231 Encounter for screening mammogram for malignant neoplasm of breast: Secondary | ICD-10-CM

## 2020-04-27 ENCOUNTER — Other Ambulatory Visit: Payer: Self-pay

## 2020-04-27 MED ORDER — ADEMPAS 2 MG PO TABS: 1.0000 | ORAL_TABLET | Freq: Three times a day (TID) | ORAL | 0 refills | Status: DC

## 2020-05-01 ENCOUNTER — Other Ambulatory Visit: Payer: Self-pay

## 2020-05-01 DIAGNOSIS — H25813 Combined forms of age-related cataract, bilateral: Secondary | ICD-10-CM | POA: Diagnosis not present

## 2020-05-01 DIAGNOSIS — H10413 Chronic giant papillary conjunctivitis, bilateral: Secondary | ICD-10-CM | POA: Diagnosis not present

## 2020-05-01 DIAGNOSIS — H35372 Puckering of macula, left eye: Secondary | ICD-10-CM | POA: Diagnosis not present

## 2020-05-01 DIAGNOSIS — H524 Presbyopia: Secondary | ICD-10-CM | POA: Diagnosis not present

## 2020-05-01 DIAGNOSIS — H0288B Meibomian gland dysfunction left eye, upper and lower eyelids: Secondary | ICD-10-CM | POA: Diagnosis not present

## 2020-05-01 DIAGNOSIS — H04123 Dry eye syndrome of bilateral lacrimal glands: Secondary | ICD-10-CM | POA: Diagnosis not present

## 2020-05-01 DIAGNOSIS — H5203 Hypermetropia, bilateral: Secondary | ICD-10-CM | POA: Diagnosis not present

## 2020-05-01 DIAGNOSIS — H0288A Meibomian gland dysfunction right eye, upper and lower eyelids: Secondary | ICD-10-CM | POA: Diagnosis not present

## 2020-05-01 DIAGNOSIS — H52202 Unspecified astigmatism, left eye: Secondary | ICD-10-CM | POA: Diagnosis not present

## 2020-05-01 MED ORDER — ADEMPAS 2 MG PO TABS: 1.0000 | ORAL_TABLET | Freq: Three times a day (TID) | ORAL | 0 refills | Status: DC

## 2020-05-15 ENCOUNTER — Encounter: Payer: Self-pay | Admitting: Gastroenterology

## 2020-05-16 ENCOUNTER — Ambulatory Visit: Payer: Medicare Other | Admitting: Internal Medicine

## 2020-05-25 ENCOUNTER — Ambulatory Visit: Payer: Medicare Other

## 2020-05-28 DIAGNOSIS — I10 Essential (primary) hypertension: Secondary | ICD-10-CM | POA: Diagnosis not present

## 2020-05-28 DIAGNOSIS — I272 Pulmonary hypertension, unspecified: Secondary | ICD-10-CM | POA: Diagnosis not present

## 2020-06-05 ENCOUNTER — Encounter: Payer: Self-pay | Admitting: Gastroenterology

## 2020-06-18 ENCOUNTER — Telehealth: Payer: Self-pay

## 2020-06-18 NOTE — Telephone Encounter (Signed)
Yes for short time

## 2020-06-18 NOTE — Telephone Encounter (Signed)
Pt called with complaints of pain in her left lower back and leg after receiving her thyroid shot. She would like to know if it is okay for her to take the Advil with the Adempas ( she takes the adempas 2mg  3x daily). She said she only takes it prn and is just concerned about taking it with her other medications.  Advil-ibuprofen 100 mg, one tablet both Saturday and Sunday

## 2020-06-19 NOTE — Telephone Encounter (Signed)
Attempted to inform patient. No answer, vm box is not setup.

## 2020-06-21 NOTE — Telephone Encounter (Signed)
Called patient, NA, LMAM

## 2020-06-22 NOTE — Telephone Encounter (Signed)
Third attempt to call pt, no answer. Left vm requesting call back.

## 2020-06-27 ENCOUNTER — Other Ambulatory Visit: Payer: Self-pay

## 2020-06-27 MED ORDER — ADEMPAS 2 MG PO TABS: 1.0000 | ORAL_TABLET | Freq: Three times a day (TID) | ORAL | 0 refills | Status: DC

## 2020-06-29 DIAGNOSIS — M7061 Trochanteric bursitis, right hip: Secondary | ICD-10-CM | POA: Diagnosis not present

## 2020-06-29 DIAGNOSIS — M7062 Trochanteric bursitis, left hip: Secondary | ICD-10-CM | POA: Diagnosis not present

## 2020-07-04 DIAGNOSIS — I8312 Varicose veins of left lower extremity with inflammation: Secondary | ICD-10-CM | POA: Diagnosis not present

## 2020-07-05 ENCOUNTER — Ambulatory Visit: Payer: Medicare Other | Admitting: Cardiology

## 2020-07-10 ENCOUNTER — Ambulatory Visit
Admission: RE | Admit: 2020-07-10 | Discharge: 2020-07-10 | Disposition: A | Discharge status: Home / Self Care | Payer: Medicare Other | Source: Ambulatory Visit | Attending: Internal Medicine | Admitting: Internal Medicine

## 2020-07-10 ENCOUNTER — Other Ambulatory Visit: Payer: Self-pay

## 2020-07-10 DIAGNOSIS — Z1231 Encounter for screening mammogram for malignant neoplasm of breast: Secondary | ICD-10-CM

## 2020-07-12 DIAGNOSIS — I8312 Varicose veins of left lower extremity with inflammation: Secondary | ICD-10-CM | POA: Diagnosis not present

## 2020-07-23 DIAGNOSIS — M5416 Radiculopathy, lumbar region: Secondary | ICD-10-CM | POA: Diagnosis not present

## 2020-07-23 DIAGNOSIS — M6281 Muscle weakness (generalized): Secondary | ICD-10-CM | POA: Diagnosis not present

## 2020-07-25 DIAGNOSIS — I8312 Varicose veins of left lower extremity with inflammation: Secondary | ICD-10-CM | POA: Diagnosis not present

## 2020-07-26 DIAGNOSIS — M5416 Radiculopathy, lumbar region: Secondary | ICD-10-CM | POA: Diagnosis not present

## 2020-07-26 DIAGNOSIS — M6281 Muscle weakness (generalized): Secondary | ICD-10-CM | POA: Diagnosis not present

## 2020-07-30 ENCOUNTER — Ambulatory Visit: Payer: Medicare Other | Admitting: Cardiology

## 2020-07-30 NOTE — Progress Notes (Deleted)
Primary Physician/Referring:  Glendale Chard, MD  Patient ID: Nancy Lambert, female    DOB: [birth year], 80 y.o.   MRN: [id number]  No chief complaint on file.  HPI:    Nancy Lambert  is a 80 y.o. AA female  Wit  primary [place], hyperlipidemia, essential hypertension,  Chronic palpitations suggestive of PVC, H/O Raynaud's disease, sclerodactyly, and scleroderma. She has been started on Adempas for pulm HTN and has chronic dyspnea, lumbar radiculopathy.   She continues to have occasional palpitations related to her stress level. She does continue to have difficulty swallowing pills even with food due to dry mouth and follows [facility]. She now presents for 3 month OV. Dyspnea is stable. No leg edema.   Past Medical History:  Diagnosis Date  . Anxiety   . Cardiac arrhythmia   . Glaucoma   . Hypertension   . 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[place])   . Pulmonary hypertension ([place])   . Scleroderma ([place])   . Status post ablation of incompetent vein using laser bilateral leg 01/2019   Past Surgical History:  Procedure Laterality Date  . CARDIAC CATHETERIZATION N/A 07/24/2015   Procedure: Right Heart Cath;  Surgeon: Adrian Prows, MD;  Location: [facility];  Service: Cardiovascular;  Laterality: N/A;  . steroid shots  2020   for pain  . VESICOVAGINAL FISTULA CLOSURE W/ TAH     Family History  Problem Relation Age of Onset  . Breast cancer Mother   . Clotting disorder Mother   . Diabetes Mother   . Breast cancer Sister   . Clotting disorder Sister   . Prostate cancer Son   . Breast cancer Daughter   . Other Father        drowned  . Breast cancer Sister   . Breast cancer Other   . Colon cancer Neg Hx     Social History   Tobacco Use  . Smoking status: Former Smoker    Packs/day: 0.25    Years: 5.00    Pack years: 1.25    Types: Cigarettes    Quit date: 1971    Years since quitting: 51.3  . Smokeless tobacco: Never Used  . Tobacco comment: very light use  when she did smoke  Substance Use Topics  . Alcohol use: Yes    Alcohol/week: 3.0 - 5.0 standard drinks    Types: 3 - 5 Standard drinks or equivalent per week    Comment: occasional   Marital Status: Widowed  ROS  Review of Systems  Cardiovascular: Positive for dyspnea on exertion (stable). Negative for leg swelling and syncope.  Musculoskeletal: Positive for arthritis, back pain and joint pain.  Gastrointestinal: Negative for melena.   Objective  There were no vitals taken for this visit.  Vitals with BMI 01/05/2020 11/09/2019 11/09/2019  Height 5' 3.8" 5' 3.8" 5' 3.8"  Weight 138 lbs 3 oz 142 lbs 13 oz 142 lbs 13 oz  BMI 23.86 06.30 16.01  Systolic 093 235 573  Diastolic 70 62 62  Pulse 61 68 68     Physical Exam Vitals reviewed.  Constitutional:      General: She is not in acute distress.    Appearance: She is well-developed.  Neck:     Vascular: No carotid bruit.  Cardiovascular:     Rate and Rhythm: Normal rate and regular rhythm.     Pulses: Intact distal pulses.          Carotid pulses are 2+ on  the right side and 2+ on the left side.      Femoral pulses are 2+ on the right side and 2+ on the left side.      Popliteal pulses are 2+ on the right side and 2+ on the left side.       Dorsalis pedis pulses are 1+ on the right side and 1+ on the left side.       Posterior tibial pulses are 0 on the right side and 0 on the left side.     Heart sounds: No murmur heard.  at the upper right sternal border and lower left sternal border. No gallop.      Comments: S1 normal, S2 is  Loud and accentuated. No leg edema, no JVD.  Pulmonary:     Effort: Pulmonary effort is normal. No accessory muscle usage.     Breath sounds: Normal breath sounds.  Abdominal:     General: Bowel sounds are normal.     Palpations: Abdomen is soft. There is hepatomegaly.  Skin:    Comments: Abnormally dry due to scleroderma    Laboratory examination:   Recent Labs    11/09/19 1518  NA 142   K 4.5  CL 103  CO2 25  GLUCOSE 81  BUN 12  CREATININE 0.80  CALCIUM 9.9  GFRNONAA 72  GFRAA 83   CrCl cannot be calculated (Patient's most recent lab result is older than the maximum 21 days allowed.).  CMP Latest Ref Rng & Units 11/09/2019 03/30/2019 01/11/2019  Glucose 65 - 99 mg/dL 81 91 76  BUN 8 - 27 mg/dL 12 6(L) 9  Creatinine 0.57 - 1.00 mg/dL 0.80 0.58 0.70  Sodium 134 - 144 mmol/L 142 141 142  Potassium 3.5 - 5.2 mmol/L 4.5 3.3(L) 4.4  Chloride 96 - 106 mmol/L 103 103 103  CO2 20 - 29 mmol/L 25 29 26   Calcium 8.7 - 10.3 mg/dL 9.9 9.2 10.2  Total Protein 6.0 - 8.5 g/dL 6.8 - 6.7  Total Bilirubin 0.0 - 1.2 mg/dL 0.5 - 0.6  Alkaline Phos 48 - 121 IU/L 86 - 51  AST 0 - 40 IU/L 22 - 15  ALT 0 - 32 IU/L 16 - 11   CBC Latest Ref Rng & Units 11/09/2019 03/30/2019 01/11/2019  WBC 3.4 - 10.8 x10E3/uL 4.3 3.5(L) 3.8  Hemoglobin 11.1 - 15.9 g/dL 12.8 12.5 13.0  Hematocrit 34.0 - 46.6 % 38.5 40.1 40.1  Platelets 150 - 450 x10E3/uL 275 251 293    Lipid Panel    Component Value Date/Time   CHOL 159 11/09/2019 1518   TRIG 53 11/09/2019 1518   HDL 59 11/09/2019 1518   CHOLHDL 2.7 11/09/2019 1518   LDLCALC 89 11/09/2019 1518    HEMOGLOBIN A1C No results found for: HGBA1C, MPG TSH Recent Labs    11/09/19 1518  TSH 2.110    Medications and allergies  No Known Allergies   Current Outpatient Medications  Medication Instructions  . aspirin EC 81 mg, Oral, Daily  . diltiazem (CARDIZEM CD) 180 mg, Oral, Daily  . omeprazole (PRILOSEC) 40 MG capsule TAKE 1 CAPSULE BY MOUTH ONCE DAILY 30  MINUTES  BEFORE  FIRST  MEAL  OF  THE  DAY  . Riociguat (ADEMPAS) 2 MG TABS 1 tablet, Oral, 3 times daily    There are no discontinued medications.  Radiology:   No results found.  Cardiac Studies:   Lexiscan myoview stress test 06/18/2015: 1. Resting EKG demonstrates normal sinus  rhythm, left axis deviation, cannot exclude inferior infarct old, Anterior infarct old.  Stress EKG  was negative for myocardial ischemia.  Patient exercised for 4 minutes and 5 seconds and achieved 5.93 Mets.  There are frequent PVCs in the recovery phase of the stress test.  Stress terminated due to achieving target heart rate, 86% of MPHR.  Stress symptoms included dyspnea. 2. Myocardial perfusion imaging is normal. Overall left ventricular systolic function was normal without regional wall motion abnormalities. The left ventricular ejection fraction was 62%.  Right heart cath 07/24/2015: Right Heart Pressure:  RA A Wave  13 mmHg,   RA V Wave  9 mmHg,   RA Mean  8 mmHg  RV Systolic Pressure  48 mmHg,   RV Diastolic Pressure  4 mmHg, RV EDP  11 mmHg  PA Systolic Pressure  48 mmHg,   PA Diastolic Pressure  14 mmHg,   PA Mean  29 mmHg. PW A Wave  19 mmHg,   PW V Wave  17 mmHg,   PW Mean  15 mmHg  QP/QS  1    Total pulmonary vascular resistance 7 [place] units.   Impression: Mild to moderate pulmonary hypertension. Wedge pressure although normal, in the upper end of spectrum suggesting the pulmonary hypertension could also be related to mild LV diastolic heart failure. Findings probably more consistent with primary pulmonary hypertension. Clinical correlation recommended.    Lower Venous DVT Study 07/14/2019: BILATERAL:  - No evidence of deep vein thrombosis seen in the lower extremities, bilaterally.   Echocardiogram 09/14/2019: Left ventricle cavity is normal in size. Moderate concentric hypertrophy of the left ventricle. Normal global wall motion. Normal LV systolic function with EF 67%. Doppler evidence of grade I (impaired) diastolic dysfunction, normal LAP. Calculated EF 67%. Left atrial cavity is moderately dilated. Trileaflet aortic valve with mild aortic valve leaflet calcification. Mild aortic stenosis. Aortic valve mean gradient of 7 mmHg, Vmax of 1.8 m/s. Calculated aortic valve area by continuity equation is 2.1 cm. Moderate tricuspid regurgitation. Estimated pulmonary artery systolic  pressure is 28 mmHg. Small circumferential pericardial effusion without hemodynamic significance.   EKG  EKG 09/09/2019: Sinus bradycardia at rate of 58 bpm, right atrial enlargement, left axis deviation, left anterior fascicular block.  Poor R wave progression, cannot exclude anteroseptal infarct old.  IVCD, LVH with repolarization abnormality, cannot exclude high lateral ischemia.  No significant change from 09/28/2018  Assessment     ICD-10-CM   1. Pulmonary hypertension (HCC)  I27.20   2. Primary hypertension  I10   3. Dyspnea on exertion  R06.00   4. Palpitations  R00.2        Recommendations:   Nancy Lambert  is a 80 y.o. AA female  Wit  primary [place], hyperlipidemia, essential hypertension,  Chronic palpitations suggestive of PVC, H/O Raynaud's disease, sclerodactyly, and scleroderma. She has been started on Adempas for pulm HTN and has chronic dyspnea, lumbar radiculopathy.  She is presently doing well, 6-minute walk test revealed stability, no clinical evidence heart failure, she is S/P bilateral vein sclerotherapy with resolution of leg edema and venous claudication.  Her blood pressure today is elevated, she was previously on amlodipine and probably discontinued due to leg edema, I would like to try diltiazem CD 180 mg daily which may also potentially help with pulmonary hypertension.  Lipids marginally abnormal, she is presently 80 years of age and has no known vascular disease, could consider low-dose high intensity statin.  I will review this again on  her next office visit.  I will see her back in 3 months for follow-up of pulmonary hypertension.  Adrian Prows, MD, [facility] 07/30/2020, 7:22 AM [facility] Pager: [phone] Office: [phone]

## 2020-07-31 ENCOUNTER — Other Ambulatory Visit: Payer: Self-pay

## 2020-07-31 ENCOUNTER — Ambulatory Visit: Payer: Medicare Other

## 2020-07-31 VITALS — Ht 64.8 in | Wt 137.0 lb

## 2020-07-31 DIAGNOSIS — Z86010 Personal history of colonic polyps: Secondary | ICD-10-CM

## 2020-07-31 MED ORDER — NA SULFATE-K SULFATE-MG SULF 17.5-3.13-1.6 GM/177ML PO SOLN: 1.0000 | Freq: Once | ORAL | 0 refills | Status: AC

## 2020-07-31 NOTE — Progress Notes (Signed)
Pt verified name, DOB, address and insurance during PV today.   Pt mailed instruction packet to included copy of consent form to read and not return, and instructions. PV completed over the phone. Pt encouraged to call with questions or issues.   No allergies to soy or egg Pt is not on blood thinners or diet pills Denies issues with sedation/intubation Denies atrial flutter/fib Denies constipation   Emmi instructions given to pt  Pt is aware of Covid safety and care partner requirements.     

## 2020-08-08 DIAGNOSIS — M5416 Radiculopathy, lumbar region: Secondary | ICD-10-CM | POA: Diagnosis not present

## 2020-08-08 DIAGNOSIS — M6281 Muscle weakness (generalized): Secondary | ICD-10-CM | POA: Diagnosis not present

## 2020-08-10 DIAGNOSIS — M5416 Radiculopathy, lumbar region: Secondary | ICD-10-CM | POA: Diagnosis not present

## 2020-08-10 DIAGNOSIS — M6281 Muscle weakness (generalized): Secondary | ICD-10-CM | POA: Diagnosis not present

## 2020-08-14 ENCOUNTER — Ambulatory Visit (AMBULATORY_SURGERY_CENTER): Payer: Medicare Other | Admitting: Gastroenterology

## 2020-08-14 ENCOUNTER — Other Ambulatory Visit: Payer: Self-pay

## 2020-08-14 ENCOUNTER — Encounter: Payer: Self-pay | Admitting: Gastroenterology

## 2020-08-14 VITALS — BP 133/62 | HR 82 | Temp 96.0°F | Resp 9 | Ht 63.8 in | Wt 137.0 lb

## 2020-08-14 DIAGNOSIS — Z86010 Personal history of colonic polyps: Secondary | ICD-10-CM | POA: Diagnosis not present

## 2020-08-14 DIAGNOSIS — K635 Polyp of colon: Secondary | ICD-10-CM | POA: Diagnosis not present

## 2020-08-14 DIAGNOSIS — D120 Benign neoplasm of cecum: Secondary | ICD-10-CM

## 2020-08-14 MED ORDER — SODIUM CHLORIDE 0.9 % IV SOLN: 500.0000 mL | Freq: Once | INTRAVENOUS | Status: DC

## 2020-08-14 NOTE — Progress Notes (Signed)
Pt's states no medical or surgical changes since previsit or office visit.  VS BY S Monday,RN

## 2020-08-14 NOTE — Patient Instructions (Signed)
Thank you for letting us take care of your healthcare needs today. ?Please see handouts given to you on Polyps, Diverticulosis and Hemorrhoids. ? ? ? ?YOU HAD AN ENDOSCOPIC PROCEDURE TODAY AT THE [facility]:   Refer to the procedure report that was given to you for any specific questions about what was found during the examination.  If the procedure report does not answer your questions, please call your gastroenterologist to clarify.  If you requested that your care partner not be given the details of your procedure findings, then the procedure report has been included in a sealed envelope for you to review at your convenience later. ? ?YOU SHOULD EXPECT: Some feelings of bloating in the abdomen. Passage of more gas than usual.  Walking can help get rid of the air that was put into your GI tract during the procedure and reduce the bloating. If you had a lower endoscopy (such as a colonoscopy or flexible sigmoidoscopy) you may notice spotting of blood in your stool or on the toilet paper. If you underwent a bowel prep for your procedure, you may not have a normal bowel movement for a few days. ? ?Please Note:  You might notice some irritation and congestion in your nose or some drainage.  This is from the oxygen used during your procedure.  There is no need for concern and it should clear up in a day or so. ? ?SYMPTOMS TO REPORT IMMEDIATELY: ? ?Following lower endoscopy (colonoscopy or flexible sigmoidoscopy): ? Excessive amounts of blood in the stool ? Significant tenderness or worsening of abdominal pains ? Swelling of the abdomen that is new, acute ? Fever of 100?F or higher ? ? ?For urgent or emergent issues, a gastroenterologist can be reached at any hour by calling [phone]. ?Do not use MyChart messaging for urgent concerns.  ? ? ?DIET:  We do recommend a small meal at first, but then you may proceed to your regular diet.  Drink plenty of fluids but you should avoid alcoholic beverages for  24 hours. ? ?ACTIVITY:  You should plan to take it easy for the rest of today and you should NOT DRIVE or use heavy machinery until tomorrow (because of the sedation medicines used during the test).   ? ?FOLLOW UP: ?Our staff will call the number listed on your records 48-72 hours following your procedure to check on you and address any questions or concerns that you may have regarding the information given to you following your procedure. If we do not reach you, we will leave a message.  We will attempt to reach you two times.  During this call, we will ask if you have developed any symptoms of COVID 19. If you develop any symptoms (ie: fever, flu-like symptoms, shortness of breath, cough etc.) before then, please call [phone].  If you test positive for Covid 19 in the 2 weeks post procedure, please call and report this information to us.   ? ?If any biopsies were taken you will be contacted by phone or by letter within the next 1-3 weeks.  Please call us at [phone] if you have not heard about the biopsies in 3 weeks.  ? ? ?SIGNATURES/CONFIDENTIALITY: ?You and/or your care partner have signed paperwork which will be entered into your electronic medical record.  These signatures attest to the fact that that the information above on your After Visit Summary has been reviewed and is understood.  Full responsibility of the confidentiality of this discharge   information lies with you and/or your care-partner. 

## 2020-08-14 NOTE — Progress Notes (Signed)
Called to room to assist during endoscopic procedure.  Patient ID and intended procedure confirmed with present staff. Received instructions for my participation in the procedure from the performing physician.  

## 2020-08-14 NOTE — Op Note (Signed)
[facility] Patient Name: Nancy Lambert Procedure Date: 08/14/2020 11:18 AM MRN: [id number] Endoscopist: Mauri Pole , MD Age: 80 Referring MD:  Date of Birth: 1941/01/04 Gender: Female Account #: [id number] Procedure:                Colonoscopy Indications:              High risk colon cancer surveillance: Personal                            history of colonic polyps, High risk colon cancer                            surveillance: Personal history of multiple (3 or                            more) adenomas Medicines:                Monitored Anesthesia Care Procedure:                Pre-Anesthesia Assessment:                           - Prior to the procedure, a History and Physical                            was performed, and patient medications and                            allergies were reviewed. The patient's tolerance of                            previous anesthesia was also reviewed. The risks                            and benefits of the procedure and the sedation                            options and risks were discussed with the patient.                            All questions were answered, and informed consent                            was obtained. Prior Anticoagulants: The patient has                            taken no previous anticoagulant or antiplatelet                            agents. ASA Grade Assessment: II - A patient with                            mild systemic disease. After reviewing the risks  and benefits, the patient was deemed in                            satisfactory condition to undergo the procedure.                           After obtaining informed consent, the colonoscope                            was passed under direct vision. Throughout the                            procedure, the patient's blood pressure, pulse, and                            oxygen saturations were monitored  continuously. The                            Olympus PCF-H190DL ([id number]) Colonoscope was                            introduced through the anus and advanced to the the                            cecum, identified by appendiceal orifice and                            ileocecal valve. The colonoscopy was performed                            without difficulty. The patient tolerated the                            procedure well. The quality of the bowel                            preparation was adequate. The ileocecal valve,                            appendiceal orifice, and rectum were photographed. Scope In: 11:29:07 AM Scope Out: 11:50:26 AM Scope Withdrawal Time: 0 hours 11 minutes 28 seconds  Total Procedure Duration: 0 hours 21 minutes 19 seconds  Findings:                 The perianal and digital rectal examinations were                            normal.                           Two sessile polyps were found in the sigmoid colon                            and cecum. The polyps were 5 to 7 mm in size. These  polyps were removed with a cold snare. Resection                            and retrieval were complete.                           Two large angiodysplastic lesions with typical                            arborization were found in the ascending colon.                           Scattered small-mouthed diverticula were found in                            the sigmoid colon and descending colon.                           Non-bleeding external and internal hemorrhoids were                            found during endoscopy. The hemorrhoids were small.                            Retroflexion not performed in the rectum due to                            small narrow rectal vault. Complications:            No immediate complications. Estimated Blood Loss:     Estimated blood loss was minimal. Impression:               - Two 5 to 7 mm polyps in the sigmoid  colon and in                            the cecum, removed with a cold snare. Resected and                            retrieved.                           - Two colonic angiodysplastic lesions.                           - Diverticulosis in the sigmoid colon and in the                            descending colon.                           - Non-bleeding external and internal hemorrhoids. Recommendation:           - Patient has a contact number available for                            emergencies. The signs and symptoms  of potential                            delayed complications were discussed with the                            patient. Return to normal activities tomorrow.                            Written discharge instructions were provided to the                            patient.                           - Resume previous diet.                           - Continue present medications.                           - Await pathology results.                           - No repeat colonoscopy due to age. Mauri Pole, MD 08/14/2020 11:56:13 AM This report has been signed electronically.

## 2020-08-14 NOTE — Progress Notes (Signed)
Pt Drowsy. VSS. To PACU, report to RN. No anesthetic complications noted.  

## 2020-08-16 ENCOUNTER — Telehealth: Payer: Self-pay

## 2020-08-16 NOTE — Telephone Encounter (Addendum)
Second attempt follow up call to pt, lm on vm. 

## 2020-08-16 NOTE — Telephone Encounter (Signed)
NO ANSWER, MESSAGE LEFT FOR PATIENT. 

## 2020-08-17 ENCOUNTER — Encounter: Payer: Self-pay | Admitting: Gastroenterology

## 2020-08-17 DIAGNOSIS — M5416 Radiculopathy, lumbar region: Secondary | ICD-10-CM | POA: Diagnosis not present

## 2020-08-17 DIAGNOSIS — M6281 Muscle weakness (generalized): Secondary | ICD-10-CM | POA: Diagnosis not present

## 2020-08-17 NOTE — Telephone Encounter (Signed)
Opened in error

## 2020-09-14 ENCOUNTER — Other Ambulatory Visit: Payer: Self-pay | Admitting: Gastroenterology

## 2020-10-10 ENCOUNTER — Other Ambulatory Visit: Payer: Self-pay

## 2020-10-10 MED ORDER — ADEMPAS 2 MG PO TABS: 1.0000 | ORAL_TABLET | Freq: Three times a day (TID) | ORAL | 0 refills | Status: DC

## 2020-11-15 ENCOUNTER — Other Ambulatory Visit: Payer: Self-pay

## 2020-11-15 ENCOUNTER — Encounter: Payer: Self-pay | Admitting: Internal Medicine

## 2020-11-15 ENCOUNTER — Ambulatory Visit (INDEPENDENT_AMBULATORY_CARE_PROVIDER_SITE_OTHER): Payer: Medicare Other | Admitting: Internal Medicine

## 2020-11-15 VITALS — BP 132/70 | HR 73 | Temp 98.7°F | Ht 63.6 in | Wt 142.6 lb

## 2020-11-15 DIAGNOSIS — M5416 Radiculopathy, lumbar region: Secondary | ICD-10-CM | POA: Diagnosis not present

## 2020-11-15 DIAGNOSIS — M349 Systemic sclerosis, unspecified: Secondary | ICD-10-CM

## 2020-11-15 DIAGNOSIS — Z0000 Encounter for general adult medical examination without abnormal findings: Secondary | ICD-10-CM

## 2020-11-15 DIAGNOSIS — Z79899 Other long term (current) drug therapy: Secondary | ICD-10-CM | POA: Diagnosis not present

## 2020-11-15 DIAGNOSIS — I272 Pulmonary hypertension, unspecified: Secondary | ICD-10-CM

## 2020-11-15 LAB — POCT URINALYSIS DIPSTICK
Bilirubin, UA: NEGATIVE
Glucose, UA: POSITIVE — AB
Ketones, UA: NEGATIVE
Leukocytes, UA: NEGATIVE
Nitrite, UA: NEGATIVE
Protein, UA: NEGATIVE
Spec Grav, UA: 1.02 (ref 1.010–1.025)
Urobilinogen, UA: 1 E.U./dL
pH, UA: 5.5 (ref 5.0–8.0)

## 2020-11-15 LAB — POCT UA - MICROALBUMIN
Albumin/Creatinine Ratio, Urine, POC: 30
Creatinine, POC: 300 mg/dL
Microalbumin Ur, POC: 10 mg/L

## 2020-11-15 NOTE — Patient Instructions (Signed)
Health Maintenance, Female Adopting a healthy lifestyle and getting preventive care are important in promoting health and wellness. Ask your health care provider about: The right schedule for you to have regular tests and exams. Things you can do on your own to prevent diseases and keep yourself healthy. What should I know about diet, weight, and exercise? Eat a healthy diet  Eat a diet that includes plenty of vegetables, fruits, low-fat dairy products, and lean protein. Do not eat a lot of foods that are high in solid fats, added sugars, or sodium.  Maintain a healthy weight Body mass index (BMI) is used to identify weight problems. It estimates body fat based on height and weight. Your health care provider can help determineyour BMI and help you achieve or maintain a healthy weight. Get regular exercise Get regular exercise. This is one of the most important things you can do for your health. Most adults should: Exercise for at least 150 minutes each week. The exercise should increase your heart rate and make you sweat (moderate-intensity exercise). Do strengthening exercises at least twice a week. This is in addition to the moderate-intensity exercise. Spend less time sitting. Even light physical activity can be beneficial. Watch cholesterol and blood lipids Have your blood tested for lipids and cholesterol at 78 years of age, then havethis test every 5 years. Have your cholesterol levels checked more often if: Your lipid or cholesterol levels are high. You are older than 78 years of age. You are at high risk for heart disease. What should I know about cancer screening? Depending on your health history and family history, you may need to have cancer screening at various ages. This may include screening for: Breast cancer. Cervical cancer. Colorectal cancer. Skin cancer. Lung cancer. What should I know about heart disease, diabetes, and high blood pressure? Blood pressure and heart  disease High blood pressure causes heart disease and increases the risk of stroke. This is more likely to develop in people who have high blood pressure readings, are of African descent, or are overweight. Have your blood pressure checked: Every 3-5 years if you are 18-39 years of age. Every year if you are 40 years old or older. Diabetes Have regular diabetes screenings. This checks your fasting blood sugar level. Have the screening done: Once every three years after age 40 if you are at a normal weight and have a low risk for diabetes. More often and at a younger age if you are overweight or have a high risk for diabetes. What should I know about preventing infection? Hepatitis B If you have a higher risk for hepatitis B, you should be screened for this virus. Talk with your health care provider to find out if you are at risk forhepatitis B infection. Hepatitis C Testing is recommended for: Everyone born from 1945 through 1965. Anyone with known risk factors for hepatitis C. Sexually transmitted infections (STIs) Get screened for STIs, including gonorrhea and chlamydia, if: You are sexually active and are younger than 78 years of age. You are older than 78 years of age and your health care provider tells you that you are at risk for this type of infection. Your sexual activity has changed since you were last screened, and you are at increased risk for chlamydia or gonorrhea. Ask your health care provider if you are at risk. Ask your health care provider about whether you are at high risk for HIV. Your health care provider may recommend a prescription medicine to help   prevent HIV infection. If you choose to take medicine to prevent HIV, you should first get tested for HIV. You should then be tested every 3 months for as long as you are taking the medicine. Pregnancy If you are about to stop having your period (premenopausal) and you may become pregnant, seek counseling before you get  pregnant. Take 400 to 800 micrograms (mcg) of folic acid every day if you become pregnant. Ask for birth control (contraception) if you want to prevent pregnancy. Osteoporosis and menopause Osteoporosis is a disease in which the bones lose minerals and strength with aging. This can result in bone fractures. If you are 65 years old or older, or if you are at risk for osteoporosis and fractures, ask your health care provider if you should: Be screened for bone loss. Take a calcium or vitamin D supplement to lower your risk of fractures. Be given hormone replacement therapy (HRT) to treat symptoms of menopause. Follow these instructions at home: Lifestyle Do not use any products that contain nicotine or tobacco, such as cigarettes, e-cigarettes, and chewing tobacco. If you need help quitting, ask your health care provider. Do not use street drugs. Do not share needles. Ask your health care provider for help if you need support or information about quitting drugs. Alcohol use Do not drink alcohol if: Your health care provider tells you not to drink. You are pregnant, may be pregnant, or are planning to become pregnant. If you drink alcohol: Limit how much you use to 0-1 drink a day. Limit intake if you are breastfeeding. Be aware of how much alcohol is in your drink. In the [place] one drink equals one 12 oz bottle of beer (355 mL), one 5 oz glass of wine (148 mL), or one 1 oz glass of hard liquor (44 mL). General instructions Schedule regular health, dental, and eye exams. Stay current with your vaccines. Tell your health care provider if: You often feel depressed. You have ever been abused or do not feel safe at home. Summary Adopting a healthy lifestyle and getting preventive care are important in promoting health and wellness. Follow your health care provider's instructions about healthy diet, exercising, and getting tested or screened for diseases. Follow your health care provider's  instructions on monitoring your cholesterol and blood pressure. This information is not intended to replace advice given to you by your health care provider. Make sure you discuss any questions you have with your healthcare provider. Document Revised: 03/10/2018 Document Reviewed: 03/10/2018 Elsevier Patient Education  2022 [employer]  
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
## 2020-11-16 LAB — CMP14+EGFR
ALT: 13 IU/L (ref 0–32)
AST: 28 IU/L (ref 0–40)
Albumin/Globulin Ratio: 2.1 (ref 1.2–2.2)
Albumin: 4.8 g/dL — ABNORMAL HIGH (ref 3.7–4.7)
Alkaline Phosphatase: 101 IU/L (ref 44–121)
BUN/Creatinine Ratio: 21 (ref 12–28)
BUN: 14 mg/dL (ref 8–27)
Bilirubin Total: 0.3 mg/dL (ref 0.0–1.2)
CO2: 27 mmol/L (ref 20–29)
Calcium: 9.9 mg/dL (ref 8.7–10.3)
Chloride: 102 mmol/L (ref 96–106)
Creatinine, Ser: 0.67 mg/dL (ref 0.57–1.00)
Globulin, Total: 2.3 g/dL (ref 1.5–4.5)
Glucose: 87 mg/dL (ref 65–99)
Potassium: 3.7 mmol/L (ref 3.5–5.2)
Sodium: 143 mmol/L (ref 134–144)
Total Protein: 7.1 g/dL (ref 6.0–8.5)
eGFR: 90 mL/min/{1.73_m2} (ref >59)

## 2020-11-16 LAB — CBC
Hematocrit: 39.9 % (ref 34.0–46.6)
Hemoglobin: 12.6 g/dL (ref 11.1–15.9)
MCH: 29.6 pg (ref 26.6–33.0)
MCHC: 31.6 g/dL (ref 31.5–35.7)
MCV: 94 fL (ref 79–97)
Platelets: 306 10*3/uL (ref 150–450)
RBC: 4.26 x10E6/uL (ref 3.77–5.28)
RDW: 12.7 % (ref 11.7–15.4)
WBC: 4.1 10*3/uL (ref 3.4–10.8)

## 2020-11-16 LAB — LIPID PANEL
Chol/HDL Ratio: 2.5 ratio (ref 0.0–4.4)
Cholesterol, Total: 188 mg/dL (ref 100–199)
HDL: 75 mg/dL (ref >39)
LDL Chol Calc (NIH): 100 mg/dL — ABNORMAL HIGH (ref 0–99)
Triglycerides: 73 mg/dL (ref 0–149)
VLDL Cholesterol Cal: 13 mg/dL (ref 5–40)

## 2020-11-22 ENCOUNTER — Telehealth: Payer: Self-pay

## 2020-11-22 NOTE — Telephone Encounter (Signed)
This nurse called patient in regards to missed AWV scheduled for today. She stated that she was not aware. We rescheduled for 12/13/2020

## 2020-12-10 NOTE — Progress Notes (Signed)
Primary Physician/Referring:  Glendale Chard, MD  Patient ID: Nancy Lambert, female    DOB: 02-22-1941, 80 y.o.   MRN: [id number]  Chief Complaint  Patient presents with   Pulmonary hypertension    Follow-up    6 month   HPI:    Nancy Lambert  is a 80 y.o. 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 She is on Adempas for pulm HTN and has chronic dyspnea, lumbar radiculopathy.   Patient presents for 42-monthfollow-up of hypertension, pulmonary hypertension with 6-minute walk test.  Last office visit advised patient to take diltiazem daily although she has chosen not to do so, otherwise she was stable from a cardiovascular standpoint.  Patient reports a significant amount of life stress, particularly at home with her family.  She does continue to have occasional episodes of palpitations which are suggestive of PVCs, however patient states these are manageable as they are infrequent.  Patient has chronic dyspnea on exertion, which remains stable.  Denies chest pain, orthopnea, leg edema, PND.  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[facility]    Pulmonary hypertension (HCC)    Scleroderma ([facility]    Status post ablation of incompetent vein using laser bilateral leg 01/2019   Past Surgical History:  Procedure Laterality Date   CARDIAC CATHETERIZATION N/A 07/24/2015   Procedure: Right Heart Cath;  Surgeon: JAdrian Prows MD;  [facility];  Service: Cardiovascular;  Laterality: N/A;   COLONOSCOPY     steroid shots  2020   for pain   VESICOVAGINAL FISTULA CLOSURE W/ TAH     Family History  Problem Relation Age of Onset   Breast cancer Mother    Clotting disorder Mother    Diabetes Mother    Breast cancer Sister    Clotting  disorder Sister    Prostate cancer Son    Breast cancer Daughter    Other Father        drowned   Breast cancer Sister    Breast cancer Other    Colon cancer Neg Hx    Colon polyps Neg Hx    Esophageal cancer Neg Hx    Rectal cancer Neg Hx    Stomach cancer Neg Hx     Social History   Tobacco Use   Smoking status: Former    Packs/day: 0.25    Years: 5.00    Pack years: 1.25    Types: Cigarettes    Quit date: 1971    Years since quitting: 51.7   Smokeless tobacco: Never   Tobacco comments:    very light use when she did smoke  Substance Use Topics   Alcohol use: Yes    Alcohol/week: 3.0 - 5.0 standard drinks    Types: 3 - 5 Standard drinks or equivalent per week    Comment: occasional   Marital Status: Widowed  ROS  Review of Systems  Constitutional: Negative for malaise/fatigue and weight gain.  Cardiovascular:  Positive for dyspnea on exertion (stable) and palpitations (occasional). Negative for chest pain, claudication, leg swelling, near-syncope, orthopnea, paroxysmal nocturnal dyspnea and syncope.  Respiratory:  Negative for shortness of breath.   Musculoskeletal:  Positive for arthritis, back pain and joint pain.  Gastrointestinal:  Negative for melena.  Neurological:  Negative for dizziness.  Objective  Blood pressure 136/69, pulse 60, temperature 98.7 F (37.1 C), resp. rate 16, height '5\' 3"'$  (1.6 m), weight 143 lb (64.9 kg), SpO2 99 %.  Vitals with BMI 12/11/2020 12/11/2020 11/15/2020  Height - '5\' 3"'$  5' 3.6"  Weight - 143 lbs 142 lbs 10 oz  BMI - [id number]  Systolic [id number]  Diastolic 69 84 70  Pulse 60 67 73     Physical Exam Vitals reviewed.  Constitutional:      General: She is not in acute distress.    Appearance: She is well-developed.  Neck:     Vascular: No carotid bruit.  Cardiovascular:     Rate and Rhythm: Normal rate and regular rhythm.     Pulses: Intact distal pulses.          Carotid pulses are 2+ on the right side and 2+ on the  left side.      Radial pulses are 2+ on the right side and 2+ on the left side.       Dorsalis pedis pulses are 1+ on the right side and 1+ on the left side.       Posterior tibial pulses are 0 on the right side and 0 on the left side.     Heart sounds: Murmur heard.  Harsh midsystolic murmur is present with a grade of 2/6 at the upper right sternal border.    No gallop.     Comments: S1 normal, S2 is  Loud and accentuated. No JVD.  Pulmonary:     Effort: Pulmonary effort is normal. No accessory muscle usage.     Breath sounds: Normal breath sounds.  Abdominal:     Palpations: There is hepatomegaly.  Musculoskeletal:     Right lower leg: No edema.     Left lower leg: No edema.  Skin:    Comments: Abnormally dry due to scleroderma   Laboratory examination:   Recent Labs    11/15/20 1556  NA 143  K 3.7  CL 102  CO2 27  GLUCOSE 87  BUN 14  CREATININE 0.67  CALCIUM 9.9   CrCl cannot be calculated (Patient's most recent lab result is older than the maximum 21 days allowed.).  CMP Latest Ref Rng & Units 11/15/2020 11/09/2019 03/30/2019  Glucose 65 - 99 mg/dL 87 81 91  BUN 8 - 27 mg/dL 14 12 6(L)  Creatinine 0.57 - 1.00 mg/dL 0.67 0.80 0.58  Sodium 134 - 144 mmol/L 143 142 141  Potassium 3.5 - 5.2 mmol/L 3.7 4.5 3.3(L)  Chloride 96 - 106 mmol/L 102 103 103  CO2 20 - 29 mmol/L '27 25 29  '$ Calcium 8.7 - 10.3 mg/dL 9.9 9.9 9.2  Total Protein 6.0 - 8.5 g/dL 7.1 6.8 -  Total Bilirubin 0.0 - 1.2 mg/dL 0.3 0.5 -  Alkaline Phos 44 - 121 IU/L 101 86 -  AST 0 - 40 IU/L 28 22 -  ALT 0 - 32 IU/L 13 16 -   CBC Latest Ref Rng & Units 11/15/2020 11/09/2019 03/30/2019  WBC 3.4 - 10.8 x10E3/uL 4.1 4.3 3.5(L)  Hemoglobin 11.1 - 15.9 g/dL 12.6 12.8 12.5  Hematocrit 34.0 - 46.6 % 39.9 38.5 40.1  Platelets 150 - 450 x10E3/uL 306 275 251    Lipid Panel    Component Value Date/Time   CHOL 188 11/15/2020 1556   TRIG 73 11/15/2020 1556   HDL 75 11/15/2020 1556   CHOLHDL 2.5 11/15/2020  1556    LDLCALC 100 (H) 11/15/2020 1556    HEMOGLOBIN A1C No results found for: HGBA1C, MPG TSH No results for input(s): TSH in the last 8760 hours.  Allergies  No Known Allergies   Medications Prior to Visit:   Outpatient Medications Prior to Visit  Medication Sig Dispense Refill   aspirin EC 81 MG tablet Take 81 mg by mouth daily.     omeprazole (PRILOSEC) 20 MG capsule Take 20 mg by mouth daily.     Riociguat (ADEMPAS) 2 MG TABS Take 1 tablet by mouth 3 (three) times daily. 252 tablet 0   diltiazem (CARDIZEM CD) 180 MG 24 hr capsule Take 1 capsule (180 mg total) by mouth daily. 30 capsule 3   Facility-Administered Medications Prior to Visit  Medication Dose Route Frequency Provider Last Rate Last Admin   0.9 %  sodium chloride infusion  500 mL Intravenous Once Nandigam, Venia Minks, MD       Final Medications at End of Visit    Current Meds  Medication Sig   aspirin EC 81 MG tablet Take 81 mg by mouth daily.   omeprazole (PRILOSEC) 20 MG capsule Take 20 mg by mouth daily.   Riociguat (ADEMPAS) 2 MG TABS Take 1 tablet by mouth 3 (three) times daily.   Current Facility-Administered Medications for the 12/11/20 encounter (Office Visit) with Alethia Berthold, PA-C  Medication   0.9 %  sodium chloride infusion   Radiology:   No results found.  Cardiac Studies:   Lexiscan myoview stress test 06/18/2015: 1. Resting EKG demonstrates normal sinus rhythm, left axis deviation, cannot exclude inferior infarct old, Anterior infarct old.  Stress EKG was negative for myocardial ischemia.  Patient exercised for 4 minutes and 5 seconds and achieved 5.93 Mets.  There are frequent PVCs in the recovery phase of the stress test.  Stress terminated due to achieving target heart rate, 86% of MPHR.  Stress symptoms included dyspnea. 2. Myocardial perfusion imaging is normal. Overall left ventricular systolic function was normal without regional wall motion abnormalities. The left ventricular  ejection fraction was 62%.  Right heart cath 07/24/2015: Right Heart Pressure:  RA A Wave  13 mmHg,   RA V Wave  9 mmHg,   RA Mean  8 mmHg  RV Systolic Pressure  48 mmHg,   RV Diastolic Pressure  4 mmHg, RV EDP  11 mmHg  PA Systolic Pressure  48 mmHg,   PA Diastolic Pressure  14 mmHg,   PA Mean  29 mmHg. PW A Wave  19 mmHg,   PW V Wave  17 mmHg,   PW Mean  15 mmHg  QP/QS  1    Total pulmonary vascular resistance 7 Wood units.   Impression: Mild to moderate pulmonary hypertension. Wedge pressure although normal, in the upper end of spectrum suggesting the pulmonary hypertension could also be related to mild LV diastolic heart failure. Findings probably more consistent with primary pulmonary hypertension. Clinical correlation recommended.   Lower Venous DVT Study 07/14/2019: BILATERAL:  - No evidence of deep vein thrombosis seen in the lower extremities, bilaterally.   Echocardiogram 09/14/2019:  Left ventricle cavity is normal in size. Moderate concentric hypertrophy of the left ventricle. Normal global wall motion. Normal LV systolic function with EF 67%. Doppler evidence of grade I (impaired) diastolic dysfunction, normal LAP. Calculated EF 67%.  Left atrial cavity is moderately dilated.  Trileaflet aortic valve with mild aortic valve leaflet calcification. Mild aortic stenosis. Aortic valve mean gradient  of 7 mmHg, Vmax of 1.8 m/s.  Calculated aortic valve area by continuity equation is 2.1 cm.  Moderate tricuspid regurgitation. Estimated pulmonary artery systolic pressure is 28 mmHg.  Small circumferential pericardial effusion without hemodynamic significance. Compared to echo 02/07/2019, MR not noted, PA pressure 28 from 35  EKG  12/11/2020: Sinus bradycardia at a rate of 57 bpm.  Left axis, left anterior fascicular block.  Right atrial enlargement.  Poor R wave progression, cannot exclude anteroseptal infarct old.  LVH with secondary repolarization abnormality.   09/09/2019: Sinus  bradycardia at rate of 58 bpm, right atrial enlargement, left axis deviation, left anterior fascicular block.  Poor R wave progression, cannot exclude anteroseptal infarct old.  IVCD, LVH with repolarization abnormality, cannot exclude high lateral ischemia.  No significant change from 09/28/2018  Assessment     ICD-10-CM   1. Pulmonary hypertension (HCC)  I27.20 EKG 12-Lead    6 minute walk    PCV ECHOCARDIOGRAM COMPLETE    2. Primary hypertension  I10 diltiazem (CARDIZEM CD) 180 MG 24 hr capsule    3. Palpitations  R00.2       Meds ordered this encounter  Medications   diltiazem (CARDIZEM CD) 180 MG 24 hr capsule    Sig: Take 1 capsule (180 mg total) by mouth daily.    Dispense:  30 capsule    Refill:  3   Medications Discontinued During This Encounter  Medication Reason   diltiazem (CARDIZEM CD) 180 MG 24 hr capsule Error     Six Minute Walk - 12/11/2020               Six Minute Walk     Supplemental oxygen during test? No      Lap distance in meters  20 meters      Laps Completed  14     Partial lap (in meters) 0 meters      Baseline Heartrate 72      Baseline SPO2 100 %            Interval Oxygen Saturation and HR      2 Minute Oxygen Saturation % 98 %      2 Minute HR 81     4 Minute Oxygen Saturation % 98 %      4 Minute HR 78      6 Minute Oxygen Saturation % 98 %      6 Minute HR 81           End of Test Values     Heartrate 81      SPO2 98 %            Interpretation     Distance completed 280 meters      Tech Comments: Patient completed all laps without complications (without stopping)    09/09/2019 Distance completed 300 meters  Recommendations:   Nancy Lambert  is a 80 y.o. 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 She is on Adempas for pulm HTN and has chronic dyspnea, lumbar radiculopathy.  Patient presents for 57-month follow-up of hypertension, pulmonary hypertension with 6-minute walk test.  Last office visit advised patient to take diltiazem daily although she has chosen not to do so, otherwise she was stable from a cardiovascular standpoint.  Patient remains stressed, otherwise she is stable from a cardiovascular standpoint clinical evidence of heart failure. Dyspnea and 6-minute walk test remain stable.  Blood pressure is again mildly elevated in the office today.  Again discussed with patient importance of taking diltiazem 180 mg daily for both palpitations and blood pressure, as well as given her history of Raynaud's disease.  Review of patient's history of pulmonary hypertension we will obtain repeat echocardiogram at this time.  Follow-up in 6 months, sooner if needed, for pulmonary hypertension.   Alethia Berthold, PA-C 12/13/2020, 8:49 AM Office: [phone]

## 2020-12-11 ENCOUNTER — Ambulatory Visit: Payer: Medicare Other | Admitting: Student

## 2020-12-11 ENCOUNTER — Encounter: Payer: Self-pay | Admitting: Student

## 2020-12-11 ENCOUNTER — Ambulatory Visit: Payer: Medicare Other | Admitting: Cardiology

## 2020-12-11 ENCOUNTER — Other Ambulatory Visit: Payer: Self-pay

## 2020-12-11 VITALS — BP 136/69 | HR 60 | Temp 98.7°F | Resp 16 | Ht 63.0 in | Wt 143.0 lb

## 2020-12-11 DIAGNOSIS — I272 Pulmonary hypertension, unspecified: Secondary | ICD-10-CM

## 2020-12-11 DIAGNOSIS — R002 Palpitations: Secondary | ICD-10-CM

## 2020-12-11 DIAGNOSIS — I10 Essential (primary) hypertension: Secondary | ICD-10-CM

## 2020-12-13 ENCOUNTER — Ambulatory Visit (INDEPENDENT_AMBULATORY_CARE_PROVIDER_SITE_OTHER): Payer: Medicare Other

## 2020-12-13 VITALS — Ht 64.0 in | Wt 140.0 lb

## 2020-12-13 DIAGNOSIS — Z0000 Encounter for general adult medical examination without abnormal findings: Secondary | ICD-10-CM

## 2020-12-13 MED ORDER — DILTIAZEM HCL ER COATED BEADS 180 MG PO CP24: 180.0000 mg | ORAL_CAPSULE | Freq: Every day | ORAL | 3 refills | Status: DC

## 2020-12-13 NOTE — Patient Instructions (Signed)
Nancy Lambert , Thank you for taking time to come for your Medicare Wellness Visit. I appreciate your ongoing commitment to your health goals. Please review the following plan we discussed and let me know if I can assist you in the future.   Screening recommendations/referrals: Colonoscopy: not required Mammogram: 07/10/2020 Bone Density: completed 08/31/2019 Recommended yearly ophthalmology/optometry visit for glaucoma screening and checkup Recommended yearly dental visit for hygiene and checkup  Vaccinations: Influenza vaccine: decline Pneumococcal vaccine: completed 05/09/2015 Tdap vaccine: completed 05/29/2011, due 05/28/2021 Shingles vaccine: discussed   Covid-19: 01/21/2020, 07/04/2019, 06/12/2019  Advanced directives: Advance directive discussed with you today.   Conditions/risks identified: none  Next appointment: Follow up in one year for your annual wellness visit    Preventive Care 65 Years and Older, Female Preventive care refers to lifestyle choices and visits with your health care provider that can promote health and wellness. What does preventive care include? A yearly physical exam. This is also called an annual well check. Dental exams once or twice a year. Routine eye exams. Ask your health care provider how often you should have your eyes checked. Personal lifestyle choices, including: Daily care of your teeth and gums. Regular physical activity. Eating a healthy diet. Avoiding tobacco and drug use. Limiting alcohol use. Practicing safe sex. Taking low-dose aspirin every day. Taking vitamin and mineral supplements as recommended by your health care provider. What happens during an annual well check? The services and screenings done by your health care provider during your annual well check will depend on your age, overall health, lifestyle risk factors, and family history of disease. Counseling  Your health care provider may ask you questions about your: Alcohol  use. Tobacco use. Drug use. Emotional well-being. Home and relationship well-being. Sexual activity. Eating habits. History of falls. Memory and ability to understand (cognition). Work and work Statistician. Reproductive health. Screening  You may have the following tests or measurements: Height, weight, and BMI. Blood pressure. Lipid and cholesterol levels. These may be checked every 5 years, or more frequently if you are over 64 years old. Skin check. Lung cancer screening. You may have this screening every year starting at age 32 if you have a 30-pack-year history of smoking and currently smoke or have quit within the past 15 years. Fecal occult blood test (FOBT) of the stool. You may have this test every year starting at age 47. Flexible sigmoidoscopy or colonoscopy. You may have a sigmoidoscopy every 5 years or a colonoscopy every 10 years starting at age 91. Hepatitis C blood test. Hepatitis B blood test. Sexually transmitted disease (STD) testing. Diabetes screening. This is done by checking your blood sugar (glucose) after you have not eaten for a while (fasting). You may have this done every 1-3 years. Bone density scan. This is done to screen for osteoporosis. You may have this done starting at age 38. Mammogram. This may be done every 1-2 years. Talk to your health care provider about how often you should have regular mammograms. Talk with your health care provider about your test results, treatment options, and if necessary, the need for more tests. Vaccines  Your health care provider may recommend certain vaccines, such as: Influenza vaccine. This is recommended every year. Tetanus, diphtheria, and acellular pertussis (Tdap, Td) vaccine. You may need a Td booster every 10 years. Zoster vaccine. You may need this after age 33. Pneumococcal 13-valent conjugate (PCV13) vaccine. One dose is recommended after age 34. Pneumococcal polysaccharide (PPSV23) vaccine. One dose is  recommended after age 63. Talk to your health care provider about which screenings and vaccines you need and how often you need them. This information is not intended to replace advice given to you by your health care provider. Make sure you discuss any questions you have with your health care provider. Document Released: 04/13/2015 Document Revised: 12/05/2015 Document Reviewed: 01/16/2015 Elsevier Interactive Patient Education  2017 [place] Prevention in the Home Falls can cause injuries. They can happen to people of all ages. There are many things you can do to make your home safe and to help prevent falls. What can I do on the outside of my home? Regularly fix the edges of walkways and driveways and fix any cracks. Remove anything that might make you trip as you walk through a door, such as a raised step or threshold. Trim any bushes or trees on the path to your home. Use bright outdoor lighting. Clear any walking paths of anything that might make someone trip, such as rocks or tools. Regularly check to see if handrails are loose or broken. Make sure that both sides of any steps have handrails. Any raised decks and porches should have guardrails on the edges. Have any leaves, snow, or ice cleared regularly. Use sand or salt on walking paths during winter. Clean up any spills in your garage right away. This includes oil or grease spills. What can I do in the bathroom? Use night lights. Install grab bars by the toilet and in the tub and shower. Do not use towel bars as grab bars. Use non-skid mats or decals in the tub or shower. If you need to sit down in the shower, use a plastic, non-slip stool. Keep the floor dry. Clean up any water that spills on the floor as soon as it happens. Remove soap buildup in the tub or shower regularly. Attach bath mats securely with double-sided non-slip rug tape. Do not have throw rugs and other things on the floor that can make you  trip. What can I do in the bedroom? Use night lights. Make sure that you have a light by your bed that is easy to reach. Do not use any sheets or blankets that are too big for your bed. They should not hang down onto the floor. Have a firm chair that has side arms. You can use this for support while you get dressed. Do not have throw rugs and other things on the floor that can make you trip. What can I do in the kitchen? Clean up any spills right away. Avoid walking on wet floors. Keep items that you use a lot in easy-to-reach places. If you need to reach something above you, use a strong step stool that has a grab bar. Keep electrical cords out of the way. Do not use floor polish or wax that makes floors slippery. If you must use wax, use non-skid floor wax. Do not have throw rugs and other things on the floor that can make you trip. What can I do with my stairs? Do not leave any items on the stairs. Make sure that there are handrails on both sides of the stairs and use them. Fix handrails that are broken or loose. Make sure that handrails are as long as the stairways. Check any carpeting to make sure that it is firmly attached to the stairs. Fix any carpet that is loose or worn. Avoid having throw rugs at the top or bottom of the stairs. If you  do have throw rugs, attach them to the floor with carpet tape. Make sure that you have a light switch at the top of the stairs and the bottom of the stairs. If you do not have them, ask someone to add them for you. What else can I do to help prevent falls? Wear shoes that: Do not have high heels. Have rubber bottoms. Are comfortable and fit you well. Are closed at the toe. Do not wear sandals. If you use a stepladder: Make sure that it is fully opened. Do not climb a closed stepladder. Make sure that both sides of the stepladder are locked into place. Ask someone to hold it for you, if possible. Clearly mark and make sure that you can  see: Any grab bars or handrails. First and last steps. Where the edge of each step is. Use tools that help you move around (mobility aids) if they are needed. These include: Canes. Walkers. Scooters. Crutches. Turn on the lights when you go into a dark area. Replace any light bulbs as soon as they burn out. Set up your furniture so you have a clear path. Avoid moving your furniture around. If any of your floors are uneven, fix them. If there are any pets around you, be aware of where they are. Review your medicines with your doctor. Some medicines can make you feel dizzy. This can increase your chance of falling. Ask your doctor what other things that you can do to help prevent falls. This information is not intended to replace advice given to you by your health care provider. Make sure you discuss any questions you have with your health care provider. Document Released: 01/11/2009 Document Revised: 08/23/2015 Document Reviewed: 04/21/2014 Elsevier Interactive Patient Education  2017 [employer].

## 2020-12-13 NOTE — Progress Notes (Signed)
I connected with Nancy Lambert today by telephone and verified that I am speaking with the correct person using two identifiers. Location patient: home Location provider: work Persons participating in the virtual visit: Nancy Lambert, Glenna Durand LPN.   I discussed the limitations, risks, security and privacy concerns of performing an evaluation and management service by telephone and the availability of in person appointments. I also discussed with the patient that there may be a patient responsible charge related to this service. The patient expressed understanding and verbally consented to this telephonic visit.    Interactive audio and video telecommunications were attempted between this provider and patient, however failed, due to patient having technical difficulties OR patient did not have access to video capability.  We continued and completed visit with audio only.     Vital signs may be patient reported or missing.  Subjective:   Nancy Lambert is a 80 y.o. female who presents for Medicare Annual (Subsequent) preventive examination.  Review of Systems     Cardiac Risk Factors include: advanced age (>43mn, >>69women);hypertension;sedentary lifestyle     Objective:    Today's Vitals   12/13/20 1001  Weight: 140 lb (63.5 kg)  Height: '5\' 4"'$  (1.626 m)   Body mass index is 24.03 kg/m.  Advanced Directives 12/13/2020 11/09/2019 03/30/2019 09/07/2018 02/13/2017 01/27/2017 07/24/2015  Does Patient Have a Medical Advance Directive? No No No No No No No  Would patient like information on creating a medical advance directive? - No - Patient declined No - Patient declined No - Patient declined - - No - patient declined information    Current Medications (verified) Outpatient Encounter Medications as of 12/13/2020  Medication Sig   aspirin EC 81 MG tablet Take 81 mg by mouth daily.   omeprazole (PRILOSEC) 20 MG capsule Take 20 mg by mouth daily.   Riociguat (ADEMPAS) 2 MG  TABS Take 1 tablet by mouth 3 (three) times daily.   diltiazem (CARDIZEM CD) 180 MG 24 hr capsule Take 1 capsule (180 mg total) by mouth daily. (Patient not taking: Reported on 12/13/2020)   Facility-Administered Encounter Medications as of 12/13/2020  Medication   0.9 %  sodium chloride infusion    Allergies (verified) Patient has no known allergies.   History: 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    Pulmonary hypertension ([facility])    [place]    Status post ablation of incompetent vein using laser bilateral leg 01/2019   Past Surgical History:  Procedure Laterality Date   CARDIAC CATHETERIZATION N/A 07/24/2015   Procedure: Right Heart Cath;  Surgeon: JAdrian Prows MD;  [facility];  Service: Cardiovascular;  Laterality: N/A;   COLONOSCOPY     steroid shots  2020   for pain   VESICOVAGINAL FISTULA CLOSURE W/ TAH     Family History  Problem Relation Age of Onset   Breast cancer Mother    Clotting disorder Mother    Diabetes Mother    Breast cancer Sister    Clotting disorder Sister    Prostate cancer Son    Breast cancer Daughter    Other Father        drowned   Breast cancer Sister    Breast cancer Other    Colon cancer Neg Hx    Colon polyps Neg Hx  Esophageal cancer Neg Hx    Rectal cancer Neg Hx    Stomach cancer Neg Hx    Social History   Socioeconomic History   Marital status: Widowed    Spouse name: Not on file   Number of children: 4   Years of education: Not on file   Highest education level: Not on file  Occupational History   Occupation: Retired  Tobacco Use   Smoking status: Former    Packs/day: 0.25    Years: 5.00    Pack years: 1.25    Types: Cigarettes    Quit date: 1971    Years since quitting: 51.7   Smokeless tobacco: Never   Tobacco comments:    very light use when she did smoke  Vaping  Use   Vaping Use: Never used  Substance and Sexual Activity   Alcohol use: Yes    Alcohol/week: 3.0 - 5.0 standard drinks    Types: 3 - 5 Standard drinks or equivalent per week    Comment: occasional   Drug use: No   Sexual activity: Not Currently  Other Topics Concern   Not on file  Social History Narrative   Lives at home with her daughter   Right handed   Social Determinants of Health   Financial Resource Strain: Low Risk    Difficulty of Paying Living Expenses: Not hard at all  Food Insecurity: No Food Insecurity   Worried About Charity fundraiser in the Last Year: Never true   Ran Out of Food in the Last Year: Never true  Transportation Needs: No Transportation Needs   Lack of Transportation (Medical): No   Lack of Transportation (Non-Medical): No  Physical Activity: Inactive   Days of Exercise per Week: 0 days   Minutes of Exercise per Session: 0 min  Stress: No Stress Concern Present   Feeling of Stress : Not at all  Social Connections: Not on file    Tobacco Counseling Counseling given: Not Answered Tobacco comments: very light use when she did smoke   Clinical Intake:  Pre-visit preparation completed: Yes  Pain : No/denies pain     Nutritional Status: BMI of 19-24  Normal Nutritional Risks: None Diabetes: No  How often do you need to have someone help you when you read instructions, pamphlets, or other written materials from your doctor or pharmacy?: 1 - Never What is the last grade level you completed in school?: [employer]  Diabetic? no  Interpreter Needed?: No  Information entered by :: NAllen LPN   Activities of Daily Living In your present state of health, do you have any difficulty performing the following activities: 12/13/2020 11/15/2020  Hearing? N N  Vision? N N  Difficulty concentrating or making decisions? N N  Walking or climbing stairs? N N  Dressing or bathing? N N  Doing errands, shopping? N N  Preparing Food and eating ?  N -  Using the Toilet? N -  In the past six months, have you accidently leaked urine? N -  Do you have problems with loss of bowel control? N -  Managing your Medications? N -  Managing your Finances? N -  Housekeeping or managing your Housekeeping? N -  Some recent data might be hidden    Patient Care Team: Glendale Chard, MD as PCP - General (Internal Medicine)  Indicate any recent Medical Services you may have received from other than [facility] providers in the past year (date may be approximate).  Assessment:   This is a routine wellness examination for Nancy Lambert.  Hearing/Vision screen Vision Screening - Comments:: Regular eye exams, Dr. Katy Fitch  Dietary issues and exercise activities discussed: Current Exercise Habits: The patient does not participate in regular exercise at present   Goals Addressed             This Visit's Progress    Patient Stated       12/13/2020, no goals       Depression Screen PHQ 2/9 Scores 12/13/2020 11/15/2020 11/09/2019 12/22/2018 10/28/2018 09/07/2018 09/07/2018  PHQ - 2 Score 0 1 0 0 0 0 6  PHQ- 9 Score - 1 - - - 1 11  Exception Documentation - - - - - - Other- indicate reason in comment box  Not completed - - - - - - patient's daughter passed away    Fall Risk Fall Risk  12/13/2020 11/15/2020 11/09/2019 12/22/2018 10/28/2018  Falls in the past year? '1 1 1 1 '$ 0  Comment tripped over side walk - fell off chair - -  Number falls in past yr: 0 0 0 1 -  Injury with Fall? 0 1 0 1 -  Risk for fall due to : Medication side effect - Medication side effect - -  Risk for fall due to: Comment - - - - -  Follow up Falls evaluation completed;Education provided;Falls prevention discussed - Falls evaluation completed;Education provided;Falls prevention discussed - -    FALL RISK PREVENTION PERTAINING TO THE HOME:  Any stairs in or around the home? Yes  If so, are there any without handrails? No  Home free of loose throw rugs in walkways, pet beds, electrical  cords, etc? Yes  Adequate lighting in your home to reduce risk of falls? Yes   ASSISTIVE DEVICES UTILIZED TO PREVENT FALLS:  Life alert? No  Use of a cane, walker or w/c? No  Grab bars in the bathroom? No  Shower chair or bench in shower? No  Elevated toilet seat or a handicapped toilet? No   TIMED UP AND GO:  Was the test performed? No .      Cognitive Function:     6CIT Screen 12/13/2020 11/09/2019 09/07/2018  What Year? 0 points 0 points 0 points  What month? 0 points 0 points 0 points  What time? 0 points 0 points 0 points  Count back from 20 0 points 2 points 0 points  Months in reverse 4 points 4 points 4 points  Repeat phrase 0 points 2 points 0 points  Total Score '4 8 4    '$ Immunizations Immunization History  Administered Date(s) Administered   Influenza Split 12/30/2014   Influenza, High Dose Seasonal PF 01/28/2018   Influenza,inj,Quad PF,6+ Mos 01/30/2016   Influenza-Unspecified 01/29/2013   PFIZER(Purple Top)SARS-COV-2 Vaccination 06/12/2019, 07/04/2019, 01/21/2020   Pneumococcal Conjugate-13 05/09/2015    TDAP status: Up to date  Flu Vaccine status: Declined, Education has been provided regarding the importance of this vaccine but patient still declined. Advised may receive this vaccine at local pharmacy or Health Dept. Aware to provide a copy of the vaccination record if obtained from local pharmacy or Health Dept. Verbalized acceptance and understanding.  Pneumococcal vaccine status: Up to date  Covid-19 vaccine status: Completed vaccines  Qualifies for Shingles Vaccine? Yes   Zostavax completed No   Shingrix Completed?: No.    Education has been provided regarding the importance of this vaccine. Patient has been advised to call insurance company to determine out of pocket  expense if they have not yet received this vaccine. Advised may also receive vaccine at local pharmacy or Health Dept. Verbalized acceptance and understanding.  Screening Tests Health  Maintenance  Topic Date Due   Zoster Vaccines- Shingrix (1 of 2) Never done   COVID-19 Vaccine (4 - Booster for Pfizer series) 04/14/2020   INFLUENZA VACCINE  10/29/2020   TETANUS/TDAP  05/28/2021   COLONOSCOPY (Pts 45-36yr Insurance coverage will need to be confirmed)  08/15/2023   DEXA SCAN  Completed   Hepatitis C Screening  Completed   PNA vac Low Risk Adult  Completed   HPV VACCINES  Aged Out    Health Maintenance  Health Maintenance Due  Topic Date Due   Zoster Vaccines- Shingrix (1 of 2) Never done   COVID-19 Vaccine (4 - Booster for Pfizer series) 04/14/2020   INFLUENZA VACCINE  10/29/2020    Colorectal cancer screening: No longer required.   Mammogram status: Completed 07/10/2020. Repeat every year  Bone Density status: Completed 08/31/2019.   Lung Cancer Screening: (Low Dose CT Chest recommended if Age 78-80years, 30 pack-year currently smoking OR have quit w/in 15years.) does not qualify.   Lung Cancer Screening Referral: no  Additional Screening:  Hepatitis C Screening: does qualify; Completed 05/03/2013  Vision Screening: Recommended annual ophthalmology exams for early detection of glaucoma and other disorders of the eye. Is the patient up to date with their annual eye exam?  Yes  Who is the provider or what is the name of the office in which the patient attends annual eye exams? Dr. GKaty FitchIf pt is not established with a provider, would they like to be referred to a provider to establish care? No .   Dental Screening: Recommended annual dental exams for proper oral hygiene  Community Resource Referral / Chronic Care Management: CRR required this visit?  No   CCM required this visit?  No      Plan:     I have personally reviewed and noted the following in the patient's chart:   Medical and social history Use of alcohol, tobacco or illicit drugs  Current medications and supplements including opioid prescriptions.  Functional ability and  status Nutritional status Physical activity Advanced directives List of other physicians Hospitalizations, surgeries, and ER visits in previous 12 months Vitals Screenings to include cognitive, depression, and falls Referrals and appointments  In addition, I have reviewed and discussed with patient certain preventive protocols, quality metrics, and best practice recommendations. A written personalized care plan for preventive services as well as general preventive health recommendations were provided to patient.     NKellie Simmering LPN   [id number]  Nurse Notes:

## 2020-12-13 NOTE — Addendum Note (Signed)
Addended by: Glenna Durand E on: 12/13/2020 12:45 PM   Modules accepted: Orders

## 2020-12-26 ENCOUNTER — Telehealth: Payer: Self-pay

## 2020-12-26 NOTE — Telephone Encounter (Signed)
   Telephone encounter was:  Successful.  12/26/2020 Name: Nancy Lambert MRN: [id number] DOB: Aug 04, 1940  [name] is a 79 y.o. year old female who is a primary care patient of Glendale Chard, MD . The community resource team was consulted for assistance with Financial Difficulties related to medical co-pays.  Care guide performed the following interventions: Spoke with patient about [place] [facility]) counselor. [place] [facility] [employer] [phone], [facility] [place] counselor [phone]. Patient has my contact information.  Follow Up Plan:  No further follow up planned at this time. The patient has been provided with needed resources.  Aitanna Haubner, AAS Paralegal, [facility] [employer]  [address], [place] [address] ??[email or web address]  ?? [phone]   [email or web address]

## 2021-01-14 ENCOUNTER — Ambulatory Visit: Payer: Medicare Other

## 2021-01-14 ENCOUNTER — Other Ambulatory Visit: Payer: Self-pay

## 2021-01-14 DIAGNOSIS — I272 Pulmonary hypertension, unspecified: Secondary | ICD-10-CM

## 2021-01-24 ENCOUNTER — Telehealth: Payer: Self-pay

## 2021-01-24 NOTE — Telephone Encounter (Signed)
Patient called requesting her Echocardiogram results. Please advise.

## 2021-01-25 ENCOUNTER — Encounter: Payer: Self-pay | Admitting: Internal Medicine

## 2021-01-28 ENCOUNTER — Other Ambulatory Visit: Payer: Self-pay

## 2021-01-28 MED ORDER — ADEMPAS 2 MG PO TABS: 1.0000 | ORAL_TABLET | Freq: Three times a day (TID) | ORAL | 0 refills | Status: DC

## 2021-01-31 NOTE — Progress Notes (Signed)
Some new abnormalities on echo. Please set up patient for visit in the next 2-3 weeks to discuss further.

## 2021-02-03 NOTE — Telephone Encounter (Signed)
Celeste sent in RN on 11/3. Just FYI the message should have been routed to her last OV was with her on 9/13

## 2021-02-05 NOTE — Progress Notes (Signed)
Called pt to inform her about her appt 02/12/2021 @10  to go over her echo. Pt understood

## 2021-02-08 IMAGING — CR LUMBAR SPINE - COMPLETE 4+ VIEW
5 series · 5 of 5 positions shown · non-contrast
Comparison: None available

CLINICAL DATA: Right hip, right groin pain, right knee gives out,
no injury, symptoms for 5-6 months 01/22/2013

EXAM:
LUMBAR SPINE - COMPLETE 4+ VIEW

[t lumbar spine ap]
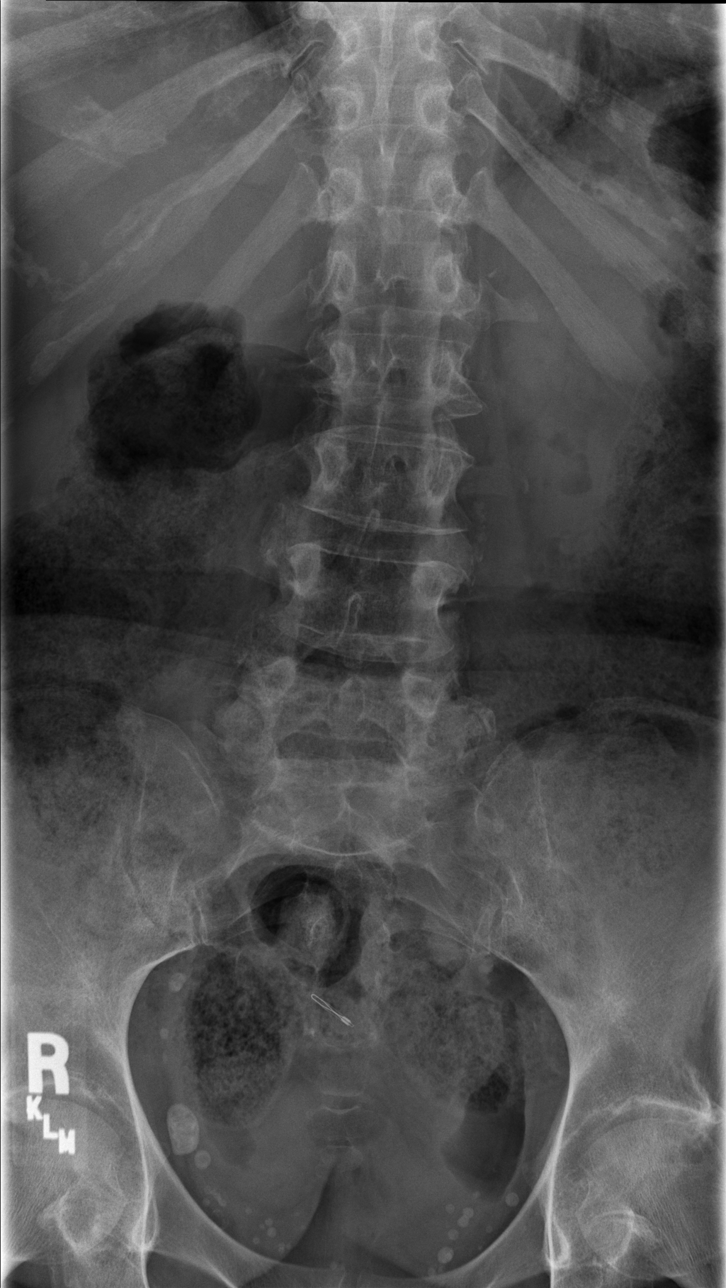

[t lumbar spine obl (1 of 2)]
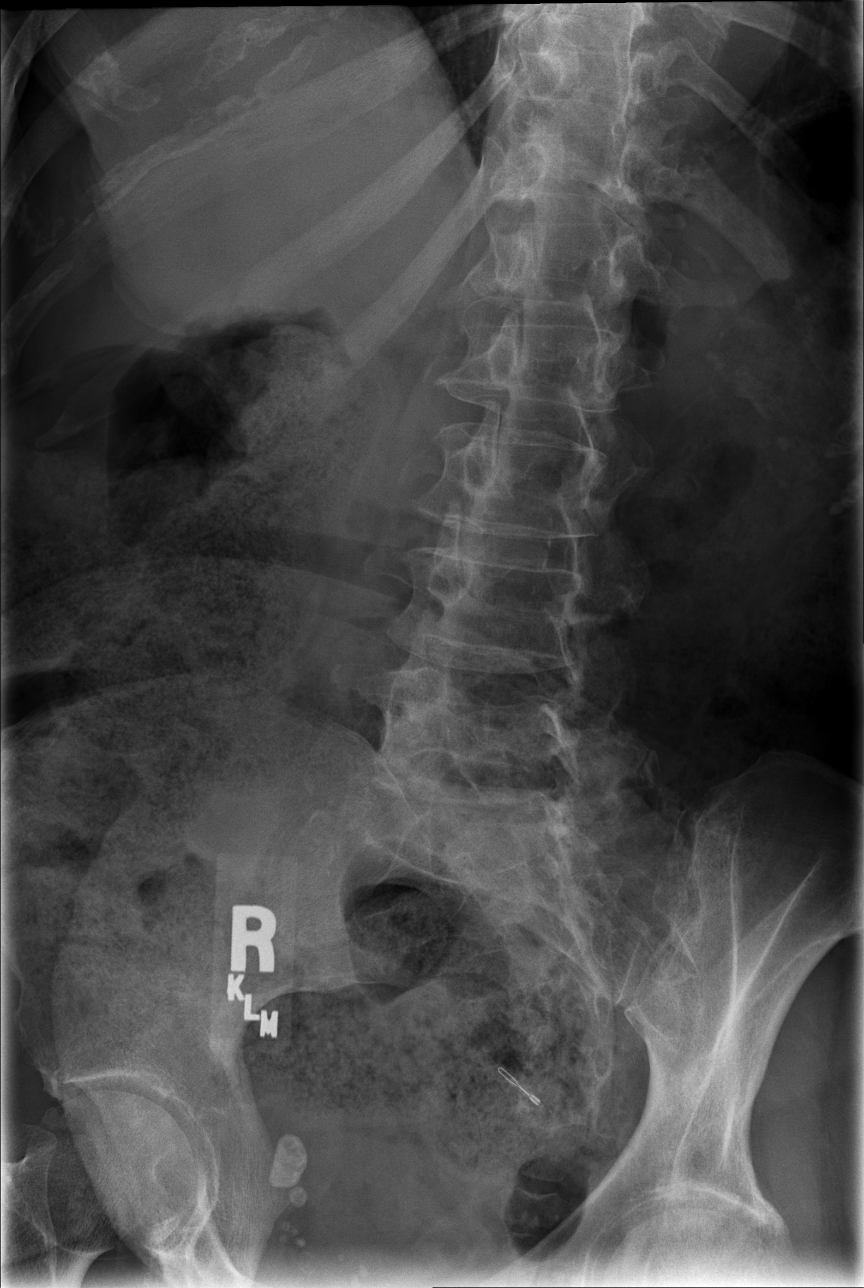

[t lumbar spine obl (2 of 2)]
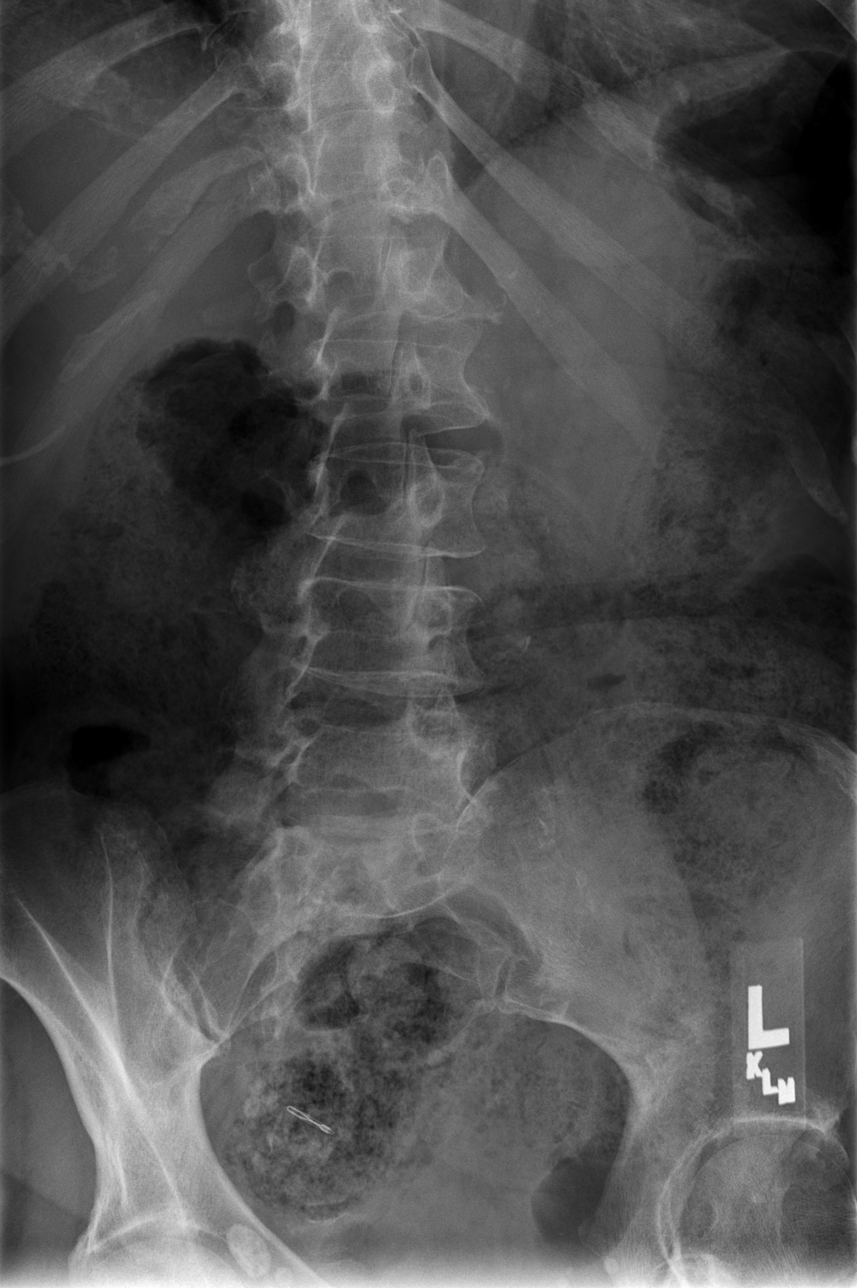

[t lumbar spine lat]
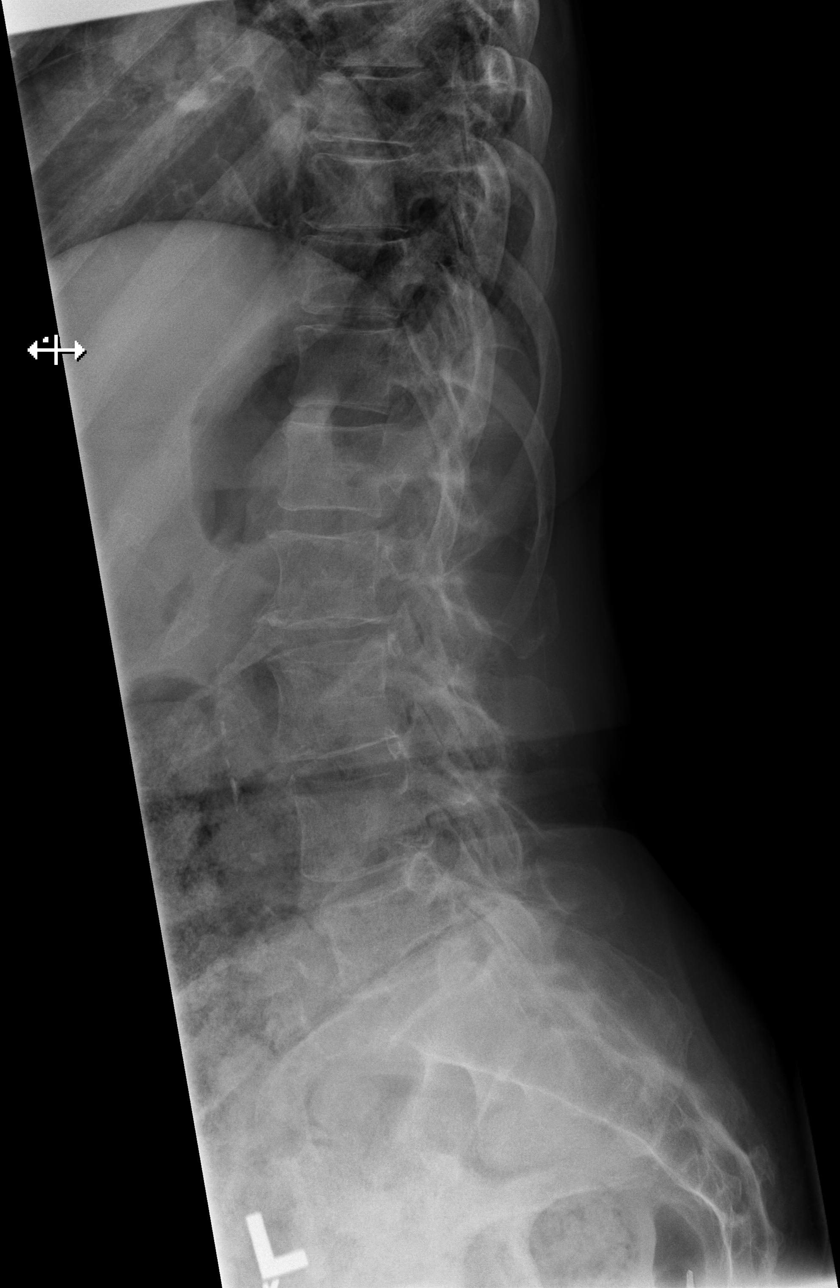

[t lumbar l-5 s-1 spot]
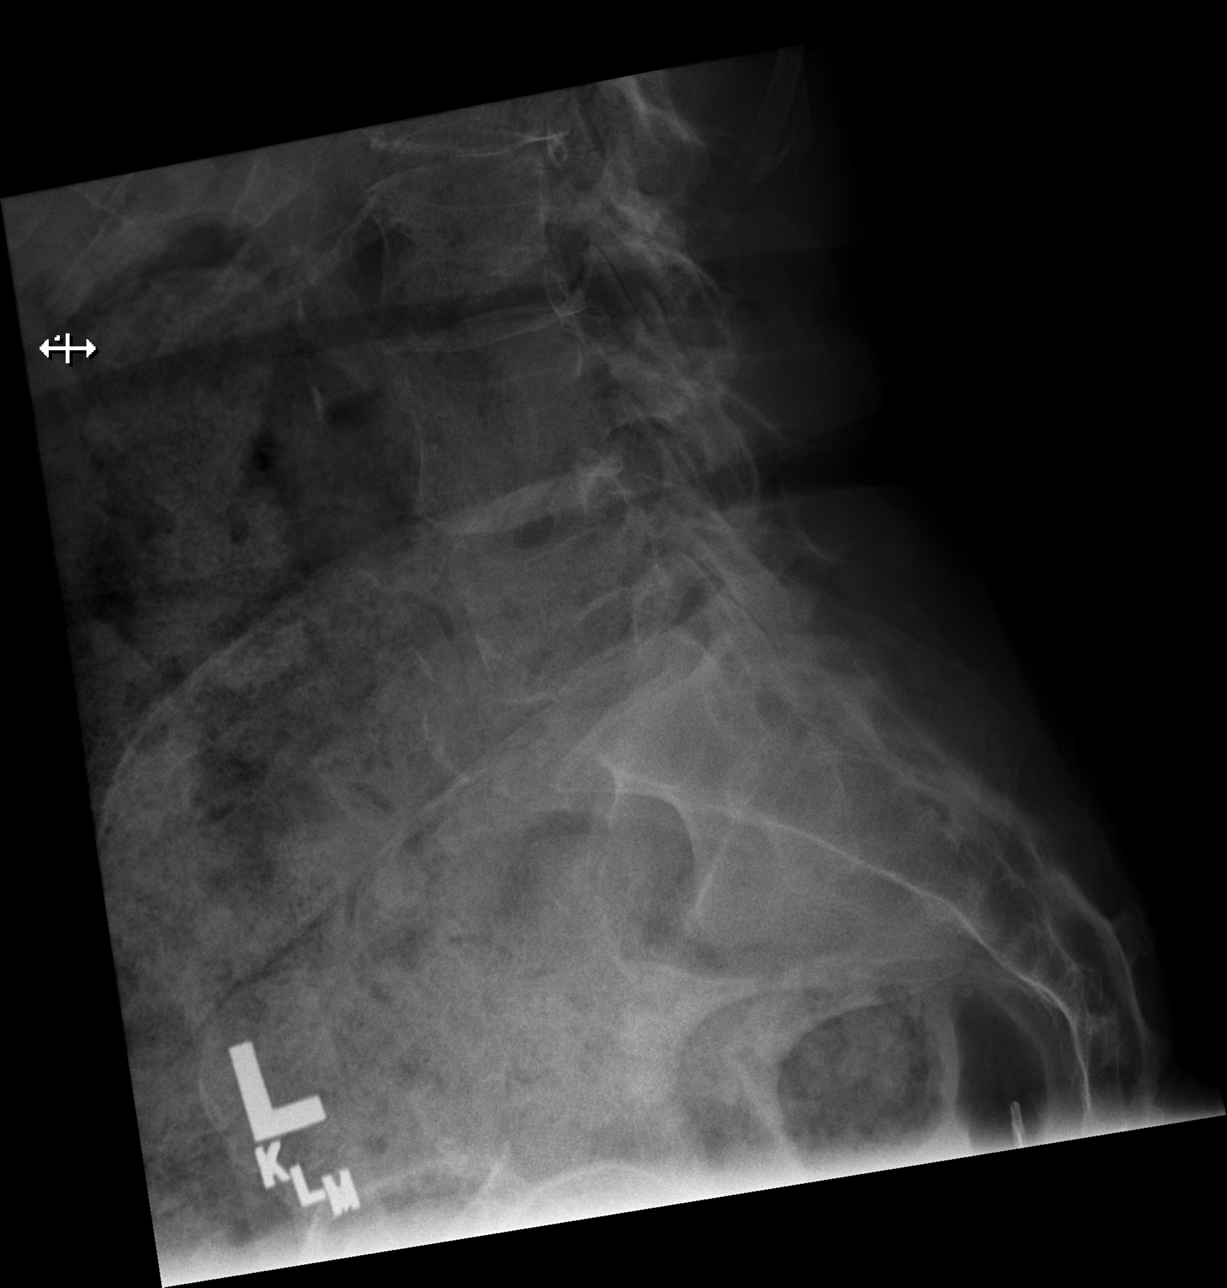

[5 of 5 positions shown; findings below may reference images not displayed]

FINDINGS: There is normal alignment of the lumbar spine. No acute fracture or
subluxation. Mild facet hypertrophy in the LOWER lumbar levels.
There is atherosclerotic calcification of the abdominal aorta.
Radiopaque metallic device overlies the central pelvis.
IMPRESSION: No evidence for acute abnormality.

## 2021-02-11 NOTE — Progress Notes (Signed)
Primary Physician/Referring:  Glendale Chard, MD  Patient ID: Nancy Lambert, female    DOB: 06/14/1940, 80 y.o.   MRN: [id number]  Chief Complaint  Patient presents with   Hypertension   Results   HPI:    Nancy Lambert  is a 80 y.o. 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 She is on Adempas for pulm HTN and has chronic dyspnea, lumbar radiculopathy.   Patient presents for follow-up to discuss recent echocardiogram results.  Patient had an echocardiogram 01/14/2021 which showed grade 2 diastolic dysfunction, which was previously grade 1 and pericardial effusion increased from small to moderate.  Patient reports overall she is feeling relatively well.  She does report she is under a lot of stress and feeling particularly anxious over the last few weeks.  She does have chronic dyspnea on exertion which remained stable.  She denies chest pain, palpitations, orthopnea, PND, leg swelling.  Denies dizziness, lightheadedness, syncope, near syncope.  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[place])    Pulmonary hypertension (HCC)    Scleroderma ([place])    Status post ablation of incompetent vein using laser bilateral leg 01/2019   Past Surgical History:  Procedure Laterality Date   CARDIAC CATHETERIZATION N/A 07/24/2015   Procedure: Right Heart Cath;  Surgeon: Adrian Prows, MD;  Location: [facility];  Service: Cardiovascular;  Laterality: N/A;   COLONOSCOPY     steroid shots  2020   for pain   VESICOVAGINAL FISTULA CLOSURE W/ TAH     Family History  Problem Relation Age of Onset   Breast cancer Mother    Clotting disorder Mother    Diabetes Mother    Breast cancer Sister    Clotting disorder Sister    Prostate cancer Son    Breast  cancer Daughter    Other Father        drowned   Breast cancer Sister    Breast cancer Other    Colon cancer Neg Hx    Colon polyps Neg Hx    Esophageal cancer Neg Hx    Rectal cancer Neg Hx    Stomach cancer Neg Hx     Social History   Tobacco Use   Smoking status: Former    Packs/day: 0.25    Years: 5.00    Pack years: 1.25    Types: Cigarettes    Quit date: 1971    Years since quitting: 51.9   Smokeless tobacco: Never   Tobacco comments:    very light use when she did smoke  Substance Use Topics   Alcohol use: Yes    Alcohol/week: 3.0 - 5.0 standard drinks    Types: 3 - 5 Standard drinks or equivalent per week    Comment: occasional   Marital Status: Widowed  ROS  Review of Systems  Constitutional: Negative for malaise/fatigue and weight gain.  Cardiovascular:  Positive for dyspnea on exertion (stable). Negative for chest pain, claudication, leg swelling, near-syncope, orthopnea, palpitations (no recurrence lately), paroxysmal nocturnal dyspnea and syncope.  Respiratory:  Negative for shortness of breath.   Musculoskeletal:  Positive for arthritis, back pain and joint pain.  Gastrointestinal:  Negative for melena.  Neurological:  Negative for dizziness.  Objective  Blood pressure (!) 148/67,  pulse 66, temperature 97.9 F (36.6 C), temperature source Temporal, height 5\' 4"  (1.626 m), weight 143 lb (64.9 kg).  Vitals with BMI 02/12/2021 12/13/2020 12/11/2020  Height 5\' 4"  5\' 4"  -  Weight 143 lbs 140 lbs -  BMI 19.41 74.08 -  Systolic 144 (No Data) 818  Diastolic 67 (No Data) 69  Pulse 66 (No Data) 60     Physical Exam Vitals reviewed.  Constitutional:      General: She is not in acute distress.    Appearance: She is well-developed.  Neck:     Vascular: No carotid bruit.  Cardiovascular:     Rate and Rhythm: Normal rate and regular rhythm.     Pulses: Intact distal pulses.          Carotid pulses are 2+ on the right side and 2+ on the left side.      Radial  pulses are 2+ on the right side and 2+ on the left side.       Dorsalis pedis pulses are 1+ on the right side and 1+ on the left side.       Posterior tibial pulses are 0 on the right side and 0 on the left side.     Heart sounds: Murmur heard.  Harsh midsystolic murmur is present with a grade of 2/6 at the upper right sternal border.    No gallop.     Comments: S1 normal, S2 is  Loud and accentuated. No JVD.  Pulmonary:     Effort: Pulmonary effort is normal. No accessory muscle usage.     Breath sounds: Normal breath sounds.  Abdominal:     Palpations: There is hepatomegaly.  Musculoskeletal:     Right lower leg: No edema.     Left lower leg: No edema.  Skin:    Comments: Abnormally dry due to scleroderma  No evidence of cardiac tamponade.  Patient is hemodynamically stable.  Laboratory examination:   Recent Labs    11/15/20 1556  NA 143  K 3.7  CL 102  CO2 27  GLUCOSE 87  BUN 14  CREATININE 0.67  CALCIUM 9.9   CrCl cannot be calculated (Patient's most recent lab result is older than the maximum 21 days allowed.).  CMP Latest Ref Rng & Units 11/15/2020 11/09/2019 03/30/2019  Glucose 65 - 99 mg/dL 87 81 91  BUN 8 - 27 mg/dL 14 12 6(L)  Creatinine 0.57 - 1.00 mg/dL 0.67 0.80 0.58  Sodium 134 - 144 mmol/L 143 142 141  Potassium 3.5 - 5.2 mmol/L 3.7 4.5 3.3(L)  Chloride 96 - 106 mmol/L 102 103 103  CO2 20 - 29 mmol/L 27 25 29   Calcium 8.7 - 10.3 mg/dL 9.9 9.9 9.2  Total Protein 6.0 - 8.5 g/dL 7.1 6.8 -  Total Bilirubin 0.0 - 1.2 mg/dL 0.3 0.5 -  Alkaline Phos 44 - 121 IU/L 101 86 -  AST 0 - 40 IU/L 28 22 -  ALT 0 - 32 IU/L 13 16 -   CBC Latest Ref Rng & Units 11/15/2020 11/09/2019 03/30/2019  WBC 3.4 - 10.8 x10E3/uL 4.1 4.3 3.5(L)  Hemoglobin 11.1 - 15.9 g/dL 12.6 12.8 12.5  Hematocrit 34.0 - 46.6 % 39.9 38.5 40.1  Platelets 150 - 450 x10E3/uL 306 275 251    Lipid Panel    Component Value Date/Time   CHOL 188 11/15/2020 1556   TRIG 73 11/15/2020 1556   HDL 75  11/15/2020 1556   CHOLHDL 2.5 11/15/2020 1556  LDLCALC 100 (H) 11/15/2020 1556    HEMOGLOBIN A1C No results found for: HGBA1C, MPG TSH No results for input(s): TSH in the last 8760 hours.  Allergies  No Known Allergies   Medications Prior to Visit:   Outpatient Medications Prior to Visit  Medication Sig Dispense Refill   aspirin EC 81 MG tablet Take 81 mg by mouth daily.     diltiazem (CARDIZEM CD) 180 MG 24 hr capsule Take 1 capsule (180 mg total) by mouth daily. 30 capsule 3   omeprazole (PRILOSEC) 20 MG capsule Take 20 mg by mouth daily.     Riociguat (ADEMPAS) 2 MG TABS Take 1 tablet by mouth 3 (three) times daily. 252 tablet 0   Facility-Administered Medications Prior to Visit  Medication Dose Route Frequency Provider Last Rate Last Admin   0.9 %  sodium chloride infusion  500 mL Intravenous Once Nandigam, Venia Minks, MD       Final Medications at End of Visit    Current Meds  Medication Sig   aspirin EC 81 MG tablet Take 81 mg by mouth daily.   diltiazem (CARDIZEM CD) 180 MG 24 hr capsule Take 1 capsule (180 mg total) by mouth daily.   omeprazole (PRILOSEC) 20 MG capsule Take 20 mg by mouth daily.   Riociguat (ADEMPAS) 2 MG TABS Take 1 tablet by mouth 3 (three) times daily.   Current Facility-Administered Medications for the 02/12/21 encounter (Office Visit) with Alethia Berthold, PA-C  Medication   0.9 %  sodium chloride infusion   Radiology:   No results found.  Cardiac Studies:   Lexiscan myoview stress test 06/18/2015: 1. Resting EKG demonstrates normal sinus rhythm, left axis deviation, cannot exclude inferior infarct old, Anterior infarct old.  Stress EKG was negative for myocardial ischemia.  Patient exercised for 4 minutes and 5 seconds and achieved 5.93 Mets.  There are frequent PVCs in the recovery phase of the stress test.  Stress terminated due to achieving target heart rate, 86% of MPHR.  Stress symptoms included dyspnea. 2. Myocardial perfusion  imaging is normal. Overall left ventricular systolic function was normal without regional wall motion abnormalities. The left ventricular ejection fraction was 62%.  Right heart cath 07/24/2015: Right Heart Pressure:  RA A Wave  13 mmHg,   RA V Wave  9 mmHg,   RA Mean  8 mmHg  RV Systolic Pressure  48 mmHg,   RV Diastolic Pressure  4 mmHg, RV EDP  11 mmHg  PA Systolic Pressure  48 mmHg,   PA Diastolic Pressure  14 mmHg,   PA Mean  29 mmHg. PW A Wave  19 mmHg,   PW V Wave  17 mmHg,   PW Mean  15 mmHg  QP/QS  1    Total pulmonary vascular resistance 7 Wood units.   Impression: Mild to moderate pulmonary hypertension. Wedge pressure although normal, in the upper end of spectrum suggesting the pulmonary hypertension could also be related to mild LV diastolic heart failure. Findings probably more consistent with primary pulmonary hypertension. Clinical correlation recommended.   Lower Venous DVT Study 07/14/2019: BILATERAL:  - No evidence of deep vein thrombosis seen in the lower extremities, bilaterally.   PCV ECHOCARDIOGRAM COMPLETE 84/66/5993 Normal LV systolic function with visual EF 60-65%. Left ventricle cavity is normal in size. Moderate to severe left ventricular hypertrophy. Normal global wall motion. Doppler evidence of grade II diastolic dysfunction, elevated LAP. Mild (Grade I) mitral regurgitation. Mild tricuspid regurgitation. Mild pulmonary hypertension. RVSP  measures 42 mmHg. Mild to moderate pulmonic regurgitation. Moderate pericardial effusion, circumferential, no hemodynamic significance. IVC is dilated with a respiratory response of >50%. Compared to study 09/14/2019: G1DD is now G2DD, Moderate TR is now mild, mild PHTN (RVSP 42mmHG) new, small pericardial effusion is now moderate, otherwise no significant change.  EKG  12/11/2020: Sinus bradycardia at a rate of 57 bpm.  Left axis, left anterior fascicular block.  Right atrial enlargement.  Poor R wave progression, cannot  exclude anteroseptal infarct old.  LVH with secondary repolarization abnormality.   09/09/2019: Sinus bradycardia at rate of 58 bpm, right atrial enlargement, left axis deviation, left anterior fascicular block.  Poor R wave progression, cannot exclude anteroseptal infarct old.  IVCD, LVH with repolarization abnormality, cannot exclude high lateral ischemia.  No significant change from 09/28/2018  Assessment     ICD-10-CM   1. Pulmonary hypertension (HCC)  I27.20 PCV ECHOCARDIOGRAM COMPLETE    2. Pericardial effusion  I31.39 PCV ECHOCARDIOGRAM COMPLETE    3. Primary hypertension  I10       No orders of the defined types were placed in this encounter.  There are no discontinued medications.    Six Minute Walk - 12/11/2020               Six Minute Walk     Supplemental oxygen during test? No      Lap distance in meters  20 meters      Laps Completed  14     Partial lap (in meters) 0 meters      Baseline Heartrate 72      Baseline SPO2 100 %            Interval Oxygen Saturation and HR      2 Minute Oxygen Saturation % 98 %      2 Minute HR 81     4 Minute Oxygen Saturation % 98 %      4 Minute HR 78      6 Minute Oxygen Saturation % 98 %      6 Minute HR 81           End of Test Values     Heartrate 81      SPO2 98 %            Interpretation     Distance completed 280 meters      Tech Comments: Patient completed all laps without complications (without stopping)    09/09/2019 Distance completed 300 meters  Recommendations:   Nancy Lambert  is a 80 y.o. 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 She is on Adempas for pulm HTN and has chronic dyspnea, lumbar radiculopathy.  Patient presents for follow-up to discuss recent echocardiogram results.  Patient had an echocardiogram 01/14/2021 which showed grade 2 diastolic dysfunction, which was previously  grade 1 and pericardial effusion increased from small to moderate.  Patient is overall stable from a dermatology standpoint and physical exam is unchanged compared to previous office visit.  Her blood pressure is mildly elevated today, however suspect this is related to stress and anxiety.  We will hold off on making medication changes at this time.  However did advise patient to monitor blood pressure on a regular basis at home and notify our office if it remains >40/90 mmHg.  There is no evidence of cardiac tamponade at today's office visit and patient is hemodynamically stable.  Reviewed and discussed at length results of echocardiogram, details above.  Patient verbalized understanding and her questions were addressed.  Will not make changes to patient's medications at this time.  However will repeat echocardiogram and 6 months to reevaluate pericardial effusion.  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  Follow-up in 6 months, sooner if needed, for pulmonary hypertension, pericardial effusion.   Alethia Berthold, PA-C 02/12/2021, 10:58 AM Office: [phone]

## 2021-02-12 ENCOUNTER — Ambulatory Visit: Payer: Medicare Other | Admitting: Student

## 2021-02-12 ENCOUNTER — Other Ambulatory Visit: Payer: Self-pay

## 2021-02-12 ENCOUNTER — Encounter: Payer: Self-pay | Admitting: Student

## 2021-02-12 VITALS — BP 148/67 | HR 66 | Temp 97.9°F | Ht 64.0 in | Wt 143.0 lb

## 2021-02-12 DIAGNOSIS — I3139 Other pericardial effusion (noninflammatory): Secondary | ICD-10-CM | POA: Diagnosis not present

## 2021-02-12 DIAGNOSIS — I272 Pulmonary hypertension, unspecified: Secondary | ICD-10-CM

## 2021-02-12 DIAGNOSIS — I10 Essential (primary) hypertension: Secondary | ICD-10-CM | POA: Diagnosis not present

## 2021-04-10 ENCOUNTER — Other Ambulatory Visit: Payer: Self-pay

## 2021-04-10 MED ORDER — ADEMPAS 2 MG PO TABS: 1.0000 | ORAL_TABLET | Freq: Three times a day (TID) | ORAL | 0 refills | Status: DC

## 2021-04-19 ENCOUNTER — Other Ambulatory Visit: Payer: Self-pay

## 2021-06-10 ENCOUNTER — Ambulatory Visit: Payer: Medicare Other | Admitting: Cardiology

## 2021-06-12 ENCOUNTER — Ambulatory Visit (INDEPENDENT_AMBULATORY_CARE_PROVIDER_SITE_OTHER): Payer: Medicare HMO | Admitting: Nurse Practitioner

## 2021-06-12 ENCOUNTER — Encounter: Payer: Self-pay | Admitting: Nurse Practitioner

## 2021-06-12 ENCOUNTER — Other Ambulatory Visit: Payer: Self-pay

## 2021-06-12 VITALS — BP 140/70 | HR 86 | Temp 98.3°F | Ht 64.0 in | Wt 143.4 lb

## 2021-06-12 DIAGNOSIS — Z6824 Body mass index (BMI) 24.0-24.9, adult: Secondary | ICD-10-CM | POA: Diagnosis not present

## 2021-06-12 DIAGNOSIS — I272 Pulmonary hypertension, unspecified: Secondary | ICD-10-CM | POA: Diagnosis not present

## 2021-06-12 DIAGNOSIS — R519 Headache, unspecified: Secondary | ICD-10-CM | POA: Diagnosis not present

## 2021-06-12 DIAGNOSIS — J0110 Acute frontal sinusitis, unspecified: Secondary | ICD-10-CM | POA: Diagnosis not present

## 2021-06-12 DIAGNOSIS — M349 Systemic sclerosis, unspecified: Secondary | ICD-10-CM | POA: Diagnosis not present

## 2021-06-12 LAB — CMP14+EGFR
ALT: 10 IU/L (ref 0–32)
AST: 18 IU/L (ref 0–40)
Albumin/Globulin Ratio: 1.7 (ref 1.2–2.2)
Albumin: 4.3 g/dL (ref 3.7–4.7)
Alkaline Phosphatase: 81 IU/L (ref 44–121)
BUN/Creatinine Ratio: 17 (ref 12–28)
BUN: 12 mg/dL (ref 8–27)
Bilirubin Total: 0.4 mg/dL (ref 0.0–1.2)
CO2: 28 mmol/L (ref 20–29)
Calcium: 9.7 mg/dL (ref 8.7–10.3)
Chloride: 102 mmol/L (ref 96–106)
Creatinine, Ser: 0.7 mg/dL (ref 0.57–1.00)
Globulin, Total: 2.6 g/dL (ref 1.5–4.5)
Glucose: 85 mg/dL (ref 70–99)
Potassium: 3.8 mmol/L (ref 3.5–5.2)
Sodium: 144 mmol/L (ref 134–144)
Total Protein: 6.9 g/dL (ref 6.0–8.5)
eGFR: 88 mL/min/{1.73_m2} (ref >59)

## 2021-06-12 LAB — CBC WITH DIFFERENTIAL/PLATELET
Basophils Absolute: 0.1 10*3/uL (ref 0.0–0.2)
Basos: 1 %
EOS (ABSOLUTE): 0.1 10*3/uL (ref 0.0–0.4)
Eos: 3 %
Hematocrit: 37.2 % (ref 34.0–46.6)
Hemoglobin: 12.1 g/dL (ref 11.1–15.9)
Immature Grans (Abs): 0 10*3/uL (ref 0.0–0.1)
Immature Granulocytes: 0 %
Lymphocytes Absolute: 1.7 10*3/uL (ref 0.7–3.1)
Lymphs: 40 %
MCH: 29.6 pg (ref 26.6–33.0)
MCHC: 32.5 g/dL (ref 31.5–35.7)
MCV: 91 fL (ref 79–97)
Monocytes Absolute: 0.3 10*3/uL (ref 0.1–0.9)
Monocytes: 8 %
Neutrophils Absolute: 2 10*3/uL (ref 1.4–7.0)
Neutrophils: 48 %
Platelets: 516 10*3/uL — ABNORMAL HIGH (ref 150–450)
RBC: 4.09 x10E6/uL (ref 3.77–5.28)
RDW: 12.5 % (ref 11.7–15.4)
WBC: 4.2 10*3/uL (ref 3.4–10.8)

## 2021-06-12 LAB — SEDIMENTATION RATE: Sed Rate: 13 mm/hr (ref 0–40)

## 2021-06-12 MED ORDER — AMOXICILLIN-POT CLAVULANATE 500-125 MG PO TABS: 1.0000 | ORAL_TABLET | Freq: Three times a day (TID) | ORAL | 0 refills | Status: DC

## 2021-06-12 NOTE — Progress Notes (Signed)
?This visit occurred during the SARS-CoV-2 public health emergency.  Safety protocols were in place, including screening questions prior to the visit, additional usage of staff PPE, and extensive cleaning of exam room while observing appropriate contact time as indicated for disinfecting solutions. ? ?Subjective:  ?  ? Patient ID: Nancy Lambert , female    DOB: 08/06/1940 , 81 y.o.   MRN: [id number] ? ? ?Chief Complaint  ?Patient presents with  ? Headache  ? ? ?HPI ? ?Patient presents today for a headache. She reports she had a really bad cold recently and now the headache will not go away. She reports she is having some dizziness as well. She has an appt with the eye doctor on April 10th.  ? ?Wt Readings from Last 3 Encounters: ?06/12/21 : 143 lb 6.4 oz (65 kg) ?02/12/21 : 143 lb (64.9 kg) ?12/13/20 : 140 lb (63.5 kg) ?   ? ?Headache  ?This is a new problem. The current episode started in the past 7 days. The problem occurs constantly. The problem has been unchanged. The pain is located in the Temporal region. The quality of the pain is described as dull, sharp and throbbing. The pain is moderate. Associated symptoms include dizziness and ear pain. Pertinent negatives include no abdominal pain or coughing. Nothing aggravates the symptoms. Treatments tried: coricidin. The treatment provided no relief.   ? ?Past Medical History:  ?Diagnosis Date  ? Anxiety   ? Cardiac arrhythmia   ? Glaucoma   ? Hyperlipidemia   ? Hypertension   ? 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[place])   ? Pulmonary hypertension ([place])   ? Scleroderma ([place])   ? 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  ? ?Family History  ?Problem Relation Age of Onset  ? Breast cancer Mother   ? Clotting disorder Mother   ? Diabetes Mother   ? Breast cancer Sister   ? Clotting disorder Sister   ? Prostate cancer Son   ? Breast cancer Daughter   ? Other Father   ?     drowned  ? Breast cancer Sister   ? Breast cancer Other    ? Colon cancer Neg Hx   ? Colon polyps Neg Hx   ? Esophageal cancer Neg Hx   ? Rectal cancer Neg Hx   ? Stomach cancer Neg Hx   ? ? ? ?Current Outpatient Medications:  ?  amoxicillin-clavulanate (AUGMENTIN) 500-125 MG tablet, Take 1 tablet (500 mg total) by mouth 3 (three) times daily., Disp: 21 tablet, Rfl: 0 ?  aspirin EC 81 MG tablet, Take 81 mg by mouth daily., Disp: , Rfl:  ?  diltiazem (CARDIZEM CD) 180 MG 24 hr capsule, Take 1 capsule (180 mg total) by mouth daily., Disp: 30 capsule, Rfl: 3 ?  omeprazole (PRILOSEC) 20 MG capsule, Take 20 mg by mouth daily., Disp: , Rfl:  ?  Riociguat (ADEMPAS) 2 MG TABS, Take 1 tablet by mouth 3 (three) times daily., Disp: 252 tablet, Rfl: 0 ? ?Current Facility-Administered Medications:  ?  0.9 %  sodium chloride infusion, 500 mL, Intravenous, Once, Nandigam, Venia Minks, MD  ? ?No Known Allergies  ? ?Review of Systems  ?Constitutional: Negative.   ?HENT:  Positive for ear pain.   ?Respiratory: Negative.  Negative for cough.   ?Cardiovascular: Negative.   ?Gastrointestinal:  Negative for abdominal pain.  ?Neurological:  Positive for dizziness and headaches.  ?Psychiatric/Behavioral: Negative.     ? ?Today's Vitals  ?  06/12/21 0950 06/12/21 6433 06/12/21 0955 06/12/21 0956  ?BP: (!) 150/80 140/87 (!) 150/72 140/70  ?Pulse: (!) 57 (!) 102 86 86  ?Temp: 98.3 ?F (36.8 ?C)     ?Weight: 143 lb 6.4 oz (65 kg)     ?Height: 5' 4"  (1.626 m)     ?PainSc: 5      ?PainLoc: Head     ? ?Body mass index is 24.61 kg/m?.  ? ?Objective:  ?Physical Exam ?Vitals reviewed.  ?Constitutional:   ?   General: She is not in acute distress. ?   Appearance: Normal appearance. She is well-developed.  ?HENT:  ?   Head: Normocephalic and atraumatic.  ?   Comments: Tenderness to frontal sinuses ?   Right Ear: Hearing, tympanic membrane, ear canal and external ear normal. There is no impacted cerumen.  ?   Left Ear: Hearing, tympanic membrane, ear canal and external ear normal. There is no impacted cerumen.  ?    Nose:  ?   Right Sinus: Frontal sinus tenderness present.  ?   Left Sinus: Frontal sinus tenderness present.  ?   Comments: Temporal areas tender ?   Mouth/Throat:  ?   Comments: Deferred - masked ?Eyes:  ?   General: Lids are normal.  ?   Extraocular Movements: Extraocular movements intact.  ?   Conjunctiva/sclera: Conjunctivae normal.  ?   Pupils: Pupils are equal, round, and reactive to light.  ?   Funduscopic exam: ?   Right eye: No papilledema.     ?   Left eye: No papilledema.  ?Neck:  ?   Thyroid: No thyroid mass.  ?   Vascular: No carotid bruit.  ?Cardiovascular:  ?   Rate and Rhythm: Normal rate and regular rhythm.  ?   Pulses: Normal pulses.  ?   Heart sounds: Normal heart sounds. No murmur heard. ?Pulmonary:  ?   Effort: Pulmonary effort is normal. No respiratory distress.  ?   Breath sounds: Normal breath sounds. No wheezing.  ?Abdominal:  ?   General: Abdomen is flat.  ?Genitourinary: ?   Rectum: Guaiac result negative.  ?Musculoskeletal:  ?   Cervical back: Full passive range of motion without pain, normal range of motion and neck supple.  ?   Right lower leg: No edema.  ?   Left lower leg: No edema.  ?Skin: ?   General: Skin is warm and dry.  ?   Capillary Refill: Capillary refill takes less than 2 seconds.  ?Neurological:  ?   General: No focal deficit present.  ?   Mental Status: She is alert and oriented to person, place, and time.  ?   Cranial Nerves: No cranial nerve deficit.  ?   Sensory: No sensory deficit.  ?Psychiatric:     ?   Mood and Affect: Mood normal.     ?   Behavior: Behavior normal.     ?   Thought Content: Thought content normal.     ?   Judgment: Judgment normal.  ?  ? ?   ?Assessment And Plan:  ?   ?1. Nonintractable headache, unspecified chronicity pattern, unspecified headache type ?Comments: May be related to sinusitis, she is encouraged to take Tylenol as needed.  ? ?2. BMI 24.0-24.9, adult ? ?3. Acute non-recurrent frontal sinusitis ?- amoxicillin-clavulanate (AUGMENTIN)  500-125 MG tablet; Take 1 tablet (500 mg total) by mouth 3 (three) times daily.  Dispense: 21 tablet; Refill: 0 ?- CBC with Differential/Platelet ? ?4.  Temporal pain ?Comments: No abnormal findings on physical exam. Due to age and headache I will check a ESR to evaluate for inflammation.  ?- Sed Rate (ESR) ? ?5. Pulmonary hypertension ([place]) ?Comments: Has not taken her medication this morning and blood pressure is slightly elevated. ?- CMP14+EGFR ? ?6. Scleroderma ([place]) ?Comments: She is doing well overall ?  ? ? ?Patient was given opportunity to ask questions. Patient verbalized understanding of the plan and was able to repeat key elements of the plan. All questions were answered to their satisfaction.  ?Minette Brine, FNP  ? ?I, Minette Brine, FNP, have reviewed all documentation for this visit. The documentation on 06/12/21 for the exam, diagnosis, procedures, and orders are all accurate and complete.  ? ?IF YOU HAVE BEEN REFERRED TO A SPECIALIST, IT MAY TAKE 1-2 WEEKS TO SCHEDULE/PROCESS THE REFERRAL. IF YOU HAVE NOT HEARD FROM US/SPECIALIST IN TWO WEEKS, PLEASE GIVE [place] A CALL AT [phone].  ? ?THE PATIENT IS ENCOURAGED TO PRACTICE SOCIAL DISTANCING DUE TO THE COVID-19 PANDEMIC.   ?

## 2021-06-19 IMAGING — DX DG CHEST 1V PORT
1 series · 1 of 1 positions shown · non-contrast
Comparison: 02/10/2018

CLINICAL DATA: [id number] positive

EXAM:
PORTABLE CHEST 1 VIEW

[chest ap]
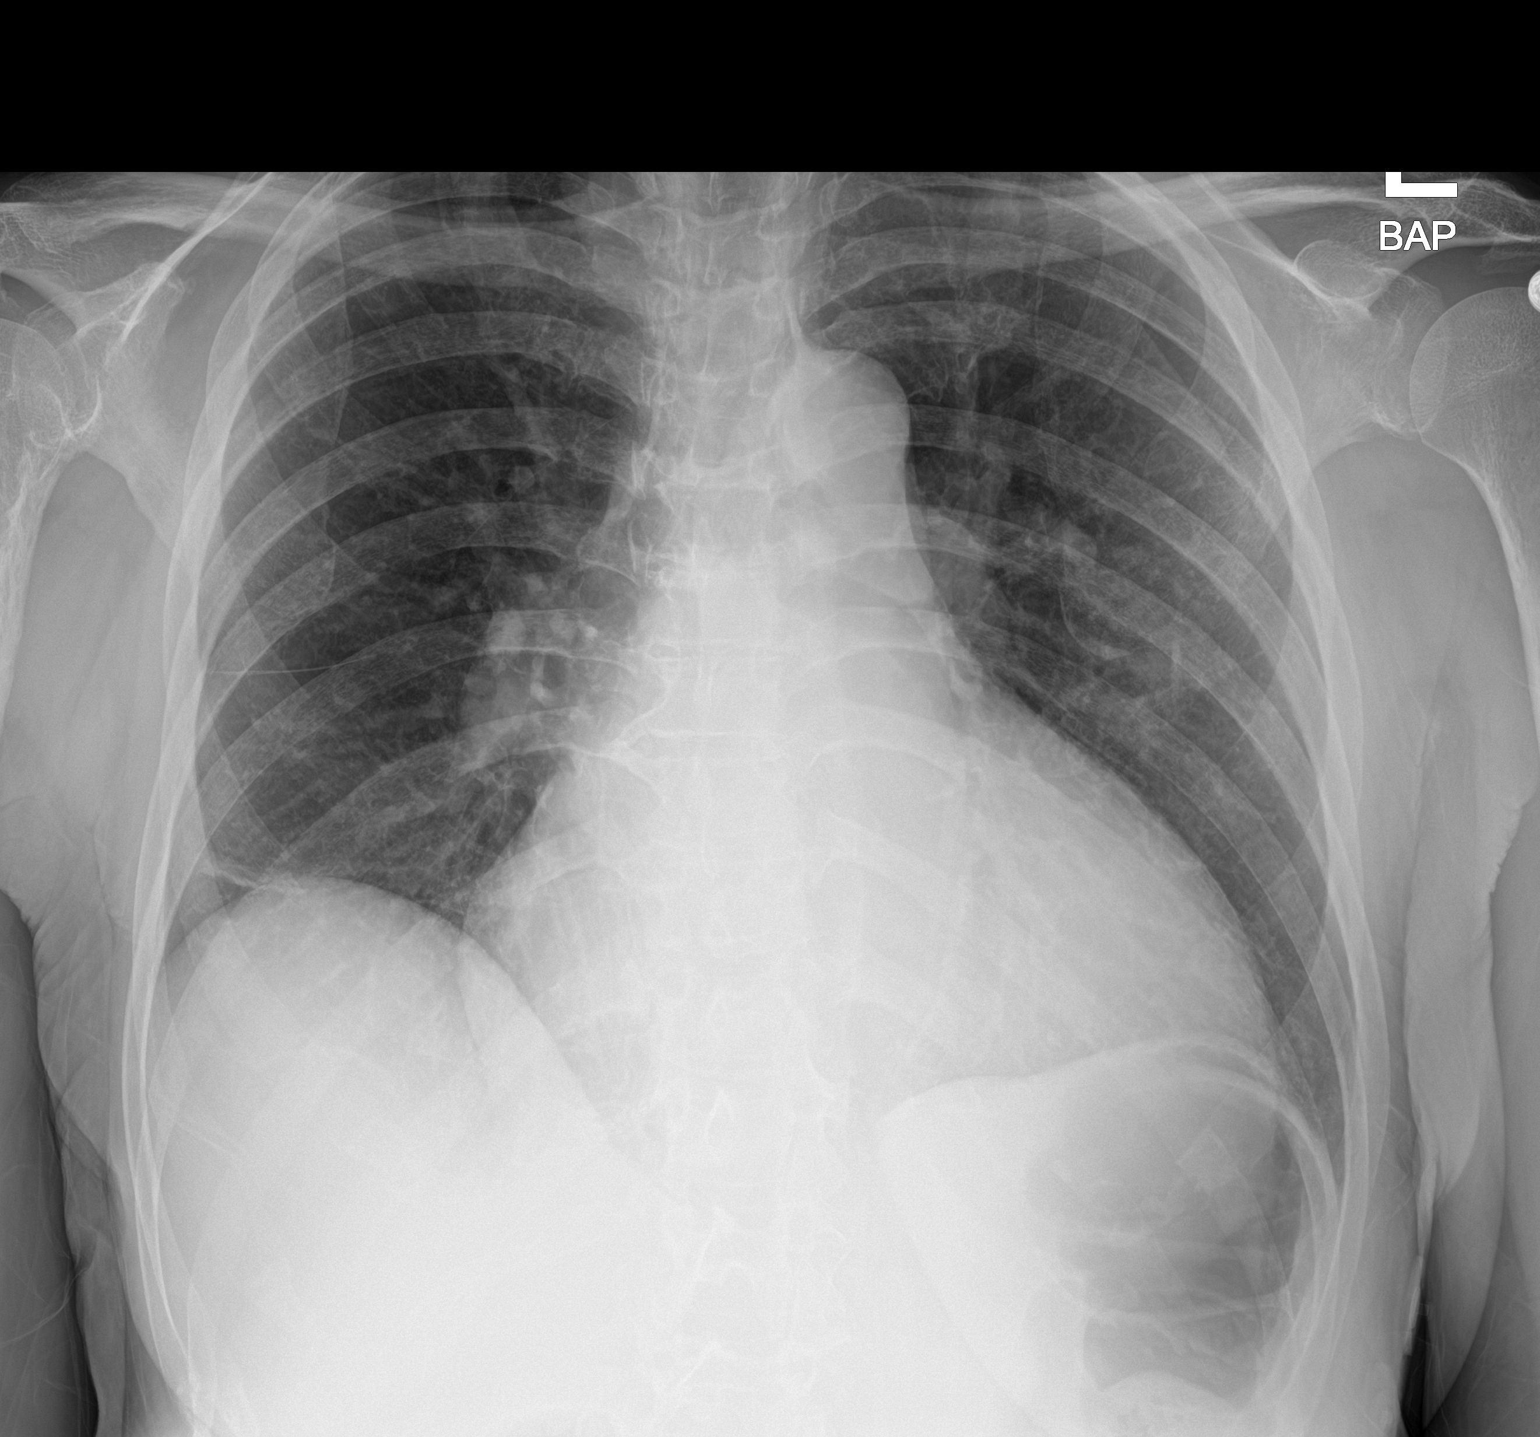

[1 of 1 positions shown; findings below may reference images not displayed]

FINDINGS: There is mild right basilar atelectasis. There is no focal
consolidation. There is no pleural effusion or pneumothorax. There
is stable cardiomegaly.

There is no acute osseous abnormality.
IMPRESSION: No active disease.

## 2021-07-23 ENCOUNTER — Encounter (HOSPITAL_BASED_OUTPATIENT_CLINIC_OR_DEPARTMENT_OTHER): Payer: Self-pay | Admitting: Emergency Medicine

## 2021-07-23 ENCOUNTER — Other Ambulatory Visit: Payer: Self-pay

## 2021-07-23 ENCOUNTER — Emergency Department (HOSPITAL_BASED_OUTPATIENT_CLINIC_OR_DEPARTMENT_OTHER)
Admission: EM | Admit: 2021-07-23 | Discharge: 2021-07-24 | Disposition: A | Discharge status: Home / Self Care | Payer: Medicare HMO | Attending: Emergency Medicine | Admitting: Emergency Medicine

## 2021-07-23 DIAGNOSIS — Z7982 Long term (current) use of aspirin: Secondary | ICD-10-CM | POA: Insufficient documentation

## 2021-07-23 DIAGNOSIS — H5789 Other specified disorders of eye and adnexa: Secondary | ICD-10-CM | POA: Diagnosis present

## 2021-07-23 DIAGNOSIS — I10 Essential (primary) hypertension: Secondary | ICD-10-CM | POA: Diagnosis not present

## 2021-07-23 DIAGNOSIS — Z79899 Other long term (current) drug therapy: Secondary | ICD-10-CM | POA: Insufficient documentation

## 2021-07-23 DIAGNOSIS — H1132 Conjunctival hemorrhage, left eye: Secondary | ICD-10-CM | POA: Diagnosis not present

## 2021-07-23 MED ORDER — ERYTHROMYCIN 5 MG/GM OP OINT: TOPICAL_OINTMENT | OPHTHALMIC | 0 refills | Status: DC

## 2021-07-23 MED ORDER — ACETAMINOPHEN 500 MG PO TABS
1000.0000 mg | ORAL_TABLET | Freq: Once | ORAL | Status: AC
  Administered 2021-07-23: 1000 mg via ORAL
  Filled 2021-07-23: qty 2

## 2021-07-23 NOTE — ED Provider Notes (Signed)
?[facility] EMERGENCY DEPARTMENT ?Provider Note ? ? ?CSN: [id number] ?Arrival date & time: 07/23/21  2138 ? ?  ? ?History ? ?Chief Complaint  ?Patient presents with  ? Eye Problem  ? ? ?Nancy Lambert is a 81 y.o. female. ? ?The history is provided by the patient.  ?Eye Problem ?Location:  Left eye ?Duration:  1 week ?Timing:  Constant ?Progression:  Waxing and waning ?Chronicity:  New ?Context comment:  Itching eye and then it was red, patient is on ASA ?Relieved by:  Nothing ?Worsened by:  Nothing ?Ineffective treatments:  None tried ?Associated symptoms: redness   ?Associated symptoms: no blurred vision, no discharge, no nausea, no numbness, no photophobia, no scotomas, no tearing, no tingling, no vomiting and no weakness   ?Risk factors: conjunctival hemorrhage   ?Patient on ASA itching L eye a week ago and it was red in the lower half.   ?  ?Past Medical History:  ?Diagnosis Date  ? Anxiety   ? Cardiac arrhythmia   ? Glaucoma   ? Hyperlipidemia   ? Hypertension   ? 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 Pulmonary hypertension ([place])   ? Scleroderma ([place])   ? 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 ? ?Home Medications ?Prior to Admission medications   ?Medication Sig Start Date End Date Taking? Authorizing Provider  ?amoxicillin-clavulanate (AUGMENTIN) 500-125 MG tablet Take 1 tablet (500 mg total) by mouth 3 (three) times daily. 06/12/21   Minette Brine, FNP  ?aspirin EC 81 MG tablet Take 81 mg by mouth daily.    [provider]  ?diltiazem (CARDIZEM CD) 180 MG 24 hr capsule Take 1 capsule (180 mg total) by mouth daily. 12/13/20 06/12/21  Cantwell, Celeste C, PA-C  ?omeprazole (PRILOSEC) 20 MG capsule Take 20 mg by mouth daily.    [provider]  ?Riociguat (ADEMPAS) 2 MG TABS Take 1 tablet by mouth 3 (three) times daily. 04/10/21   Cantwell, Celeste C, PA-C  ?   ? ?Allergies    ?Patient has no known allergies.   ? ?Review of Systems    ?Review of Systems  ?Constitutional:  Negative for fever.  ?HENT:  Negative for facial swelling.   ?Eyes:  Positive for redness. Negative for blurred vision, photophobia, discharge and visual disturbance.  ?Respiratory:  Negative for shortness of breath.   ?Cardiovascular:  Negative for chest pain.  ?Gastrointestinal:  Negative for abdominal pain, nausea and vomiting.  ?Musculoskeletal:  Negative for neck stiffness.  ?Skin:  Negative for rash.  ?Neurological:  Negative for tingling, facial asymmetry, weakness and numbness.  ?Psychiatric/Behavioral:  Negative for agitation.   ?All other systems reviewed and are negative. ? ?Physical Exam ?Updated Vital Signs ?BP (!) 174/70   Pulse 63   Temp 98.1 ?F (36.7 ?C) (Oral)   Resp 18   SpO2 95%  ?Physical Exam ?Vitals and nursing note reviewed.  ?Constitutional:   ?   General: She is not in acute distress. ?   Appearance: Normal appearance.  ?HENT:  ?   Head: Normocephalic and atraumatic.  ?   Nose: Nose normal.  ?   Mouth/Throat:  ?   Mouth: Mucous membranes are moist.  ?Eyes:  ?   General: Lids are normal. Lids are everted, no foreign bodies appreciated. No visual field deficit.    ?   Left eye: No foreign body, discharge or hordeolum.  ?   Extraocular Movements: Extraocular movements intact.  ?  Conjunctiva/sclera: Conjunctivae normal.  ?   Left eye: No chemosis or hemorrhage. ?   Pupils: Pupils are equal, round, and reactive to light.  ? ?   Comments: Pressure in L eye is 18  ?Cardiovascular:  ?   Rate and Rhythm: Normal rate and regular rhythm.  ?   Pulses: Normal pulses.  ?   Heart sounds: Normal heart sounds.  ?Pulmonary:  ?   Effort: Pulmonary effort is normal.  ?   Breath sounds: Normal breath sounds.  ?Abdominal:  ?   General: Bowel sounds are normal.  ?   Palpations: Abdomen is soft.  ?   Tenderness: There is no abdominal tenderness. There is no guarding.  ?Musculoskeletal:     ?   General: Normal range of motion.  ?   Cervical back: Normal range of motion  and neck supple.  ?Skin: ?   General: Skin is warm and dry.  ?   Capillary Refill: Capillary refill takes less than 2 seconds.  ?Neurological:  ?   General: No focal deficit present.  ?   Mental Status: She is alert and oriented to person, place, and time.  ?   Deep Tendon Reflexes: Reflexes normal.  ?Psychiatric:     ?   Mood and Affect: Mood normal.     ?   Behavior: Behavior normal.  ? ?  Visual Acuity ? ?Right Eye Distance: 20/40 ?Left Eye Distance: 20/32 ?Bilateral Distance: 20/32 ? ?Right Eye Near:   ?Left Eye Near:    ?Bilateral Near:    ? ? ?ED Results / Procedures / Treatments   ?Labs ?(all labs ordered are listed, but only abnormal results are displayed) ?Labs Reviewed - No data to display ? ?EKG ?None ? ?Radiology ?No results found. ? ?Procedures ?Procedures  ? ? ?Medications Ordered in ED ?Medications  ?acetaminophen (TYLENOL) tablet 1,000 mg (1,000 mg Oral Given 07/23/21 2315)  ? ? ?ED Course/ Medical Decision Making/ A&P ?  ?                        ?Medical Decision Making ?Previous conjunctival hemorrhage and itched L eye on ASA and now it is back  ? ?Amount and/or Complexity of Data Reviewed ?Independent Historian:  ?   Details: daughter see above ?External Data Reviewed: notes. ?   Details: previous notes reviewed ? ?Risk ?OTC drugs. ?Risk Details: Patient with repeat subconjunctival hemorrhage.  Normal vision and normal eye pressure.  Has an eye appointment.  Will start ointment to soothe L eye.  Stable for discharge with close follow up.   ? ? ?Final Clinical Impression(s) / ED Diagnoses ?Final diagnoses:  ?None  ? ?Return for intractable cough, coughing up blood, fevers > 100.4 unrelieved by medication, shortness of breath, intractable vomiting, chest pain, shortness of breath, weakness, numbness, changes in speech, facial asymmetry, abdominal pain, passing out, Inability to tolerate liquids or food, cough, altered mental status or any concerns. No signs of systemic illness or infection. The  patient is nontoxic-appearing on exam and vital signs are within normal limits.  ?I have reviewed the triage vital signs and the nursing notes. Pertinent labs & imaging results that were available during my care of the patient were reviewed by me and considered in my medical decision making (see chart for details). After history, exam, and medical workup I feel the patient has been appropriately medically screened and is safe for discharge home. Pertinent diagnoses were discussed with the patient.  Patient was given return precautions. ?  ?  ?Rx / DC Orders ?ED Discharge Orders   ? ? None  ? ?  ? ? ?  ?Daemyn Gariepy, MD ?07/23/21 2342 ? ?

## 2021-07-23 NOTE — ED Triage Notes (Signed)
Pt presents with blood in lower left eye x 2 days. Also endorses left eye pain that goes into the left forehead. Denies injury, blood thinners, vision changes.  ?

## 2021-07-23 NOTE — ED Notes (Addendum)
Pt has glasses that are bifocal. And she does not wear them all the time. During exam pt missed some of the letters. ?

## 2021-07-24 ENCOUNTER — Telehealth: Payer: Self-pay

## 2021-07-24 NOTE — Telephone Encounter (Signed)
Transition Care Management Follow-up Telephone Call ?Date of discharge and from where: 07/23/[facility]  ?How have you been since you were released from the hospital? Pt states she is okay she feels better, the situation she states was scary, but she is better now.  ?Any questions or concerns? No ? ?Items Reviewed: ?Did the pt receive and understand the discharge instructions provided? Yes  ?Medications obtained and verified? Yes  ?Other? Yes  ?Any new allergies since your discharge? No  ?Dietary orders reviewed? Yes ?Do you have support at home? Yes  ? ?Home Care and Equipment/Supplies: ?Were home health services ordered? no ?If so, what is the name of the agency? N/a  ?Has the agency set up a time to come to the patient's home? no ?Were any new equipment or medical supplies ordered?  No ?What is the name of the medical supply agency? N/a ?Were you able to get the supplies/equipment? no ?Do you have any questions related to the use of the equipment or supplies? No ? ?Functional Questionnaire: (I = Independent and D = Dependent) ?ADLs: i ? ?Bathing/Dressing- i ? ?Meal Prep- i ? ?Eating- i ? ?Maintaining continence- i ? ?Transferring/Ambulation- i ? ?Managing Meds- i ? ?Follow up appointments reviewed: ? ?PCP Hospital f/u appt confirmed? Yes  Scheduled to see n/a on n/a @ n/a. ?[facility] f/u appt confirmed? No  Scheduled to see n/a on n/A @ N/A . ?Are transportation arrangements needed? No  ?If their condition worsens, is the pt aware to call PCP or go to the Emergency Dept.? Yes ?Was the patient provided with contact information for the PCP's office or ED? Yes ?Was to pt encouraged to call back with questions or concerns? Yes  ?

## 2021-07-24 NOTE — Telephone Encounter (Signed)
Transition Care Management Unsuccessful Follow-up Telephone Call ? ?Date of discharge and from where:  07/23/2021 [facility]  ? ?Attempts:  1st Attempt ? ?Reason for unsuccessful TCM follow-up call:  Voice mail full ? ?  ?

## 2021-07-24 NOTE — ED Notes (Signed)
Patient discharged to home.  All discharge instructions reviewed.  Patient verbalized understanding via teachback method.  VS WDL.  Respirations even and unlabored.  Ambulatory out of ED.   °

## 2021-07-25 DIAGNOSIS — H0288B Meibomian gland dysfunction left eye, upper and lower eyelids: Secondary | ICD-10-CM | POA: Diagnosis not present

## 2021-07-25 DIAGNOSIS — H0288A Meibomian gland dysfunction right eye, upper and lower eyelids: Secondary | ICD-10-CM | POA: Diagnosis not present

## 2021-07-25 DIAGNOSIS — H04123 Dry eye syndrome of bilateral lacrimal glands: Secondary | ICD-10-CM | POA: Diagnosis not present

## 2021-07-25 DIAGNOSIS — H25813 Combined forms of age-related cataract, bilateral: Secondary | ICD-10-CM | POA: Diagnosis not present

## 2021-07-25 DIAGNOSIS — H1132 Conjunctival hemorrhage, left eye: Secondary | ICD-10-CM | POA: Diagnosis not present

## 2021-07-25 DIAGNOSIS — H10413 Chronic giant papillary conjunctivitis, bilateral: Secondary | ICD-10-CM | POA: Diagnosis not present

## 2021-07-25 DIAGNOSIS — H35372 Puckering of macula, left eye: Secondary | ICD-10-CM | POA: Diagnosis not present

## 2021-08-06 ENCOUNTER — Ambulatory Visit (INDEPENDENT_AMBULATORY_CARE_PROVIDER_SITE_OTHER): Payer: Medicare HMO | Admitting: Internal Medicine

## 2021-08-06 ENCOUNTER — Encounter: Payer: Self-pay | Admitting: Internal Medicine

## 2021-08-06 VITALS — BP 130/78 | HR 71 | Temp 98.2°F | Ht 64.0 in | Wt 144.0 lb

## 2021-08-06 DIAGNOSIS — I272 Pulmonary hypertension, unspecified: Secondary | ICD-10-CM | POA: Diagnosis not present

## 2021-08-06 DIAGNOSIS — Z2821 Immunization not carried out because of patient refusal: Secondary | ICD-10-CM

## 2021-08-06 DIAGNOSIS — H1132 Conjunctival hemorrhage, left eye: Secondary | ICD-10-CM | POA: Diagnosis not present

## 2021-08-06 DIAGNOSIS — M349 Systemic sclerosis, unspecified: Secondary | ICD-10-CM | POA: Diagnosis not present

## 2021-08-06 DIAGNOSIS — Z79899 Other long term (current) drug therapy: Secondary | ICD-10-CM | POA: Diagnosis not present

## 2021-08-06 DIAGNOSIS — R5383 Other fatigue: Secondary | ICD-10-CM | POA: Diagnosis not present

## 2021-08-06 NOTE — Patient Instructions (Signed)
Subconjunctival Hemorrhage Subconjunctival hemorrhage is bleeding that happens between the white part of your eye (sclera) and the clear membrane that covers the outside of your eye (conjunctiva). There are many tiny blood vessels near the surface of your eye. A subconjunctival hemorrhage happens when one or more of these vessels breaks and bleeds, causing a red patch to appear on your eye. This is similar to a bruise. Depending on the amount of bleeding, the red patch may only cover a small area of your eye or it may cover the entire visible part of the sclera. If a lot of blood collects under the conjunctiva, there may also be swelling. Subconjunctival hemorrhages do not affect your vision or cause pain, but your eye may feel irritated if there is swelling. Subconjunctival hemorrhages usually do not require treatment, and they usually disappear on their own within two to four weeks. What are the causes? This condition may be caused by: Mild trauma, such as rubbing your eye too hard. Blunt injuries, such as from playing sports or coming into contact with a deployed airbag. Coughing, sneezing, or vomiting. Straining, such as when lifting a heavy object. Medical conditions, such as: High blood pressure. Diabetes. Recent eye surgery. Certain medicines, especially blood thinners (anticoagulants), including aspirin. Other conditions, such as eye tumors, bleeding disorders, or blood vessel abnormalities. Subconjunctival hemorrhages can also happen without an obvious cause. What are the signs or symptoms? Symptoms of this condition include: A bright red or dark red patch on the white part of the eye. The red area may: Spread out to cover a larger area of the eye before it goes away. Turn colors such as pink or brownish-yellow before it goes away. Swelling around the eye. Mild eye irritation. How is this diagnosed? This condition is diagnosed with a physical exam. If your subconjunctival hemorrhage  was caused by trauma, your health care provider may refer you to an eye specialist (ophthalmologist) or another specialist to check for other injuries. You may have other tests, including: An eye exam including a vision test, checking your eye with a type of microscope (slit lamp) and measuring the pressure in your eye. Your eye may be dilated, especially if your subconjunctival hemorrhage was caused by trauma. A blood pressure check. Blood tests to check for bleeding disorders. If your subconjunctival hemorrhage was caused by trauma, X-rays or a CT scan may be done to check for other injuries. How is this treated? Usually, treatment is not needed for this condition. If you have discomfort, your health care provider may recommend eye drops or cold compresses. Follow these instructions at home: Take over-the-counter and prescription medicines only as directed by your health care provider. Use eye drops or cold compresses to help with discomfort as directed by your health care provider. Avoid activities, things, and environments that may irritate or injure your eye. Keep all follow-up visits. This is important. Contact a health care provider if: You have pain in your eye. The bleeding does not go away within 4 weeks. You keep getting new subconjunctival hemorrhages. Get help right away if: Your vision changes, you have difficulty seeing, or you develop double vision. You suddenly develop severe sensitivity to light. You develop a severe headache, persistent vomiting, confusion, or abnormal tiredness (lethargy). Your eye seems to bulge or protrude from your eye socket. You develop unexplained bruises on your body. You have unexplained bleeding in another area of your body. These symptoms may represent a serious problem that is an emergency. Do not   wait to see if the symptoms will go away. Get medical help right away. Call your local emergency services (911 in the [place] Do not drive yourself to  the hospital. Summary Subconjunctival hemorrhage is bleeding that happens between the white part of your eye and the clear membrane that covers the outside of your eye. This condition is similar to a bruise. Subconjunctival hemorrhages usually do not require treatment, and they usually disappear on their own within two to four weeks. Use eye drops or cold compresses to help with discomfort as directed by your health care provider. This information is not intended to replace advice given to you by your health care provider. Make sure you discuss any questions you have with your health care provider. Document Revised: 05/23/2020 Document Reviewed: 05/23/2020 Elsevier Patient Education  2023 [employer] Inc[employer]  

## 2021-08-06 NOTE — Progress Notes (Signed)
?Industrial/product designer as a Education administrator for Maximino Greenland, MD.,have documented all relevant documentation on the behalf of Maximino Greenland, MD,as directed by  Maximino Greenland, MD while in the presence of Maximino Greenland, MD. ? ?This visit occurred during the SARS-CoV-2 public health emergency.  Safety protocols were in place, including screening questions prior to the visit, additional usage of staff PPE, and extensive cleaning of exam room while observing appropriate contact time as indicated for disinfecting solutions. ? ?Subjective:  ?  ? Patient ID: Nancy Lambert , female    DOB: [birth year] , 81 y.o.   MRN: [id number] ? ? ?Chief Complaint  ?Patient presents with  ? Follow-up  ? ? ?HPI ? ?Patient presents today for emergency room follow up. She was seen in ED on 4/25 for evaluation of a red eye. She states she went to ER because her eye doctor would not see her. She is not sure what precipitated her sx. States she awakened that morning with a red eye. Denies blurred vision, double vision and headache.  ? ?Since her ER d/c she has not had any worsening of her sx. She states the redness has decreased significantly.  ?  ? ?Past Medical History:  ?Diagnosis Date  ? Anxiety   ? Cardiac arrhythmia   ? Glaucoma   ? Hyperlipidemia   ? Hypertension   ? 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 Pulmonary hypertension ([place])   ? Scleroderma ([place])   ? 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  ? ?Family History  ?Problem Relation Age of Onset  ? Breast cancer Mother   ? Clotting disorder Mother   ? Diabetes Mother   ? Breast cancer Sister   ? Clotting disorder Sister   ? Prostate cancer Son   ? Breast cancer Daughter   ? Other Father   ?     drowned  ? Breast cancer Sister   ? Breast cancer Other   ? Colon cancer Neg Hx   ? Colon polyps Neg Hx   ? Esophageal cancer Neg Hx   ? Rectal cancer Neg Hx   ? Stomach cancer Neg Hx   ? ? ? ?Current Outpatient Medications:  ?  aspirin EC 81 MG  tablet, Take 81 mg by mouth daily., Disp: , Rfl:  ?  erythromycin ophthalmic ointment, Place a 1/2 inch ribbon of ointment into the lower eyelid BID., Disp: 3.5 g, Rfl: 0 ?  omeprazole (PRILOSEC) 20 MG capsule, Take 20 mg by mouth daily., Disp: , Rfl:  ?  Riociguat (ADEMPAS) 2 MG TABS, Take 1 tablet by mouth 3 (three) times daily., Disp: 252 tablet, Rfl: 0 ?  diltiazem (CARDIZEM CD) 180 MG 24 hr capsule, Take 1 capsule (180 mg total) by mouth daily., Disp: 30 capsule, Rfl: 3 ? ?Current Facility-Administered Medications:  ?  0.9 %  sodium chloride infusion, 500 mL, Intravenous, Once, Nandigam, Venia Minks, MD  ? ?No Known Allergies  ? ?Review of Systems  ?Constitutional:  Positive for fatigue.  ?Eyes:  Positive for redness.  ?Respiratory: Negative.    ?Cardiovascular: Negative.   ?Gastrointestinal: Negative.   ?Neurological: Negative.    ? ?Today's Vitals  ? 08/06/21 1554  ?BP: 130/78  ?Pulse: 71  ?Temp: 98.2 ?F (36.8 ?C)  ?TempSrc: Oral  ?Weight: 144 lb (65.3 kg)  ?Height: '5\' 4"'$  (1.626 m)  ? ?Body mass index is 24.72 kg/m?.  ?Wt Readings from Last 3 Encounters:  ?  08/06/21 144 lb (65.3 kg)  ?07/24/21 143 lb 4.8 oz (65 kg)  ?06/12/21 143 lb 6.4 oz (65 kg)  ? ?. ?Objective:  ?Physical Exam ?Vitals and nursing note reviewed.  ?Constitutional:   ?   Appearance: Normal appearance.  ?HENT:  ?   Head: Normocephalic and atraumatic.  ?Eyes:  ?   Comments: Slight redness left eye, medially  ?Cardiovascular:  ?   Rate and Rhythm: Normal rate and regular rhythm.  ?   Heart sounds: Normal heart sounds.  ?Pulmonary:  ?   Effort: Pulmonary effort is normal.  ?   Breath sounds: Normal breath sounds.  ?Skin: ?   General: Skin is warm.  ?Neurological:  ?   General: No focal deficit present.  ?   Mental Status: She is alert.  ?Psychiatric:     ?   Mood and Affect: Mood normal.     ?   Behavior: Behavior normal.  ?   ?Assessment And Plan:  ?   ?1. Subconjunctival hemorrhage of left eye ?Comments: Resolving, 97% resolved. She has f/u with  her ophthalmologist scheduled for 08/15/21.  She is encouraged to keep this appt, even if fully resolved.  ? ?2. Other fatigue ?Comments: I will check her thyroid function today.  She is encouraged to stay well hydrated.  ?- TSH ? ?3. Scleroderma ([place]) ?Comments: Chronic, Rheumatology notes reviewed.  ? ?4. Pulmonary hypertension ([place]) ?Comments: Chronic, I appreciate Cardiology input. She is on Adempas for pulm HTN, she still has chronic dyspnea. ? ?5. Drug therapy ?Comments: She has been on PPI for a long period of time, I will check vitamin B12 level today.  ?- Vitamin B12 ? ?6. Herpes zoster vaccination declined ?  ?Patient was given opportunity to ask questions. Patient verbalized understanding of the plan and was able to repeat key elements of the plan. All questions were answered to their satisfaction.  ? ?I, Maximino Greenland, MD, have reviewed all documentation for this visit. The documentation on 08/11/21 for the exam, diagnosis, procedures, and orders are all accurate and complete.  ? ?IF YOU HAVE BEEN REFERRED TO A SPECIALIST, IT MAY TAKE 1-2 WEEKS TO SCHEDULE/PROCESS THE REFERRAL. IF YOU HAVE NOT HEARD FROM US/SPECIALIST IN TWO WEEKS, PLEASE GIVE [place] A CALL AT [phone] X [phone].  ? ?THE PATIENT IS ENCOURAGED TO PRACTICE SOCIAL DISTANCING DUE TO THE COVID-19 PANDEMIC.   ?

## 2021-08-07 LAB — VITAMIN B12: Vitamin B-12: 370 pg/mL (ref 232–1245)

## 2021-08-07 LAB — TSH: TSH: 3.06 u[IU]/mL (ref 0.450–4.500)

## 2021-08-13 ENCOUNTER — Other Ambulatory Visit: Payer: Medicare HMO

## 2021-08-13 ENCOUNTER — Ambulatory Visit: Payer: Medicare HMO

## 2021-08-13 DIAGNOSIS — I272 Pulmonary hypertension, unspecified: Secondary | ICD-10-CM | POA: Diagnosis not present

## 2021-08-13 DIAGNOSIS — I3139 Other pericardial effusion (noninflammatory): Secondary | ICD-10-CM

## 2021-08-15 DIAGNOSIS — H0288A Meibomian gland dysfunction right eye, upper and lower eyelids: Secondary | ICD-10-CM | POA: Diagnosis not present

## 2021-08-15 DIAGNOSIS — H0288B Meibomian gland dysfunction left eye, upper and lower eyelids: Secondary | ICD-10-CM | POA: Diagnosis not present

## 2021-08-15 DIAGNOSIS — H35372 Puckering of macula, left eye: Secondary | ICD-10-CM | POA: Diagnosis not present

## 2021-08-15 DIAGNOSIS — H25813 Combined forms of age-related cataract, bilateral: Secondary | ICD-10-CM | POA: Diagnosis not present

## 2021-08-15 DIAGNOSIS — H10413 Chronic giant papillary conjunctivitis, bilateral: Secondary | ICD-10-CM | POA: Diagnosis not present

## 2021-08-15 DIAGNOSIS — H04123 Dry eye syndrome of bilateral lacrimal glands: Secondary | ICD-10-CM | POA: Diagnosis not present

## 2021-08-19 NOTE — Progress Notes (Unsigned)
Primary Physician/Referring:  Glendale Chard, MD  Patient ID: Nancy Lambert, female    DOB: [birth year], 81 y.o.   MRN: [id number]  No chief complaint on file.  HPI:    Nancy Lambert  is a 81 y.o. 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 She is on Adempas for pulm HTN and has chronic dyspnea, lumbar radiculopathy.   Patient presents for 23-monthfollow-up.  Last office visit given pericardial effusion ordered repeat echocardiogram which revealed stable moderate pericardial effusion with no hemodynamic significance.  At last office visit patient was otherwise stable and no changes were made. ***  ***  Patient presents for follow-up to discuss recent echocardiogram results.  Patient had an echocardiogram 01/14/2021 which showed grade 2 diastolic dysfunction, which was previously grade 1 and pericardial effusion increased from small to moderate.  Patient reports overall she is feeling relatively well.  She does report she is under a lot of stress and feeling particularly anxious over the last few weeks.  She does have chronic dyspnea on exertion which remained stable.  She denies chest pain, palpitations, orthopnea, PND, leg swelling.  Denies dizziness, lightheadedness, syncope, near syncope.  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[facility]    Pulmonary hypertension ([facility])    Scleroderma ([facility] [place]    Status post ablation of incompetent vein using laser bilateral leg 01/2019   Past Surgical History:  Procedure Laterality Date   CARDIAC CATHETERIZATION N/A 07/24/2015   Procedure: Right Heart Cath;  Surgeon: JAdrian Prows MD;  [facility];  Service: Cardiovascular;  Laterality: N/A;   COLONOSCOPY     steroid shots  2020   for pain    VESICOVAGINAL FISTULA CLOSURE W/ TAH     Family History  Problem Relation Age of Onset   Breast cancer Mother    Clotting disorder Mother    Diabetes Mother    Breast cancer Sister    Clotting disorder Sister    Prostate cancer Son    Breast cancer Daughter    Other Father        drowned   Breast cancer Sister    Breast cancer Other    Colon cancer Neg Hx    Colon polyps Neg Hx    Esophageal cancer Neg Hx    Rectal cancer Neg Hx    Stomach cancer Neg Hx     Social History   Tobacco Use   Smoking status: Former    Packs/day: 0.25    Years: 5.00    Pack years: 1.25    Types: Cigarettes    Quit date: 1971    Years since quitting: 52.4   Smokeless tobacco: Never   Tobacco comments:    very light use when she did smoke  Substance Use Topics   Alcohol use: Yes    Alcohol/week: 3.0 - 5.0 standard drinks    Types: 3 - 5 Standard drinks or equivalent per week    Comment: occasional   Marital Status: Widowed  ROS  Review of Systems  Constitutional: Negative for malaise/fatigue and weight gain.  Cardiovascular:  Positive for dyspnea on exertion (stable). Negative for chest pain, claudication, leg swelling, near-syncope, orthopnea, palpitations (no recurrence lately), paroxysmal nocturnal dyspnea and syncope.  Respiratory:  Negative  for shortness of breath.   Musculoskeletal:  Positive for arthritis, back pain and joint pain.  Gastrointestinal:  Negative for melena.  Neurological:  Negative for dizziness.  Objective  There were no vitals taken for this visit.     08/06/2021    3:54 PM 07/24/2021   12:03 AM 07/23/2021   11:17 PM  Vitals with BMI  Height '5\' 4"'$  '5\' 4"'$    Weight 144 lbs 143 lbs 5 oz   BMI 77.82 42.35   Systolic 361  443  Diastolic 78  70  Pulse 71  63     Physical Exam Vitals reviewed.  Constitutional:      General: She is not in acute distress.    Appearance: She is well-developed.  Neck:     Vascular: No carotid bruit.  Cardiovascular:     Rate and  Rhythm: Normal rate and regular rhythm.     Pulses: Intact distal pulses.          Carotid pulses are 2+ on the right side and 2+ on the left side.      Radial pulses are 2+ on the right side and 2+ on the left side.       Dorsalis pedis pulses are 1+ on the right side and 1+ on the left side.       Posterior tibial pulses are 0 on the right side and 0 on the left side.     Heart sounds: Murmur heard.  Harsh midsystolic murmur is present with a grade of 2/6 at the upper right sternal border.    No gallop.     Comments: S1 normal, S2 is  Loud and accentuated. No JVD.  Pulmonary:     Effort: Pulmonary effort is normal. No accessory muscle usage.     Breath sounds: Normal breath sounds.  Abdominal:     Palpations: There is hepatomegaly.  Musculoskeletal:     Right lower leg: No edema.     Left lower leg: No edema.  Skin:    Comments: Abnormally dry due to scleroderma  No evidence of cardiac tamponade.  Patient is hemodynamically stable.  Laboratory examination:   Recent Labs    11/15/20 1556 06/12/21 1027  NA 143 144  K 3.7 3.8  CL 102 102  CO2 27 28  GLUCOSE 87 85  BUN 14 12  CREATININE 0.67 0.70  CALCIUM 9.9 9.7    CrCl cannot be calculated (Patient's most recent lab result is older than the maximum 21 days allowed.).     Latest Ref Rng & Units 06/12/2021   10:27 AM 11/15/2020    3:56 PM 11/09/2019    3:18 PM  CMP  Glucose 70 - 99 mg/dL 85   87   81    BUN 8 - 27 mg/dL '12   14   12    '$ Creatinine 0.57 - 1.00 mg/dL 0.70   0.67   0.80    Sodium 134 - 144 mmol/L 144   143   142    Potassium 3.5 - 5.2 mmol/L 3.8   3.7   4.5    Chloride 96 - 106 mmol/L 102   102   103    CO2 20 - 29 mmol/L '28   27   25    '$ Calcium 8.7 - 10.3 mg/dL 9.7   9.9   9.9    Total Protein 6.0 - 8.5 g/dL 6.9   7.1   6.8    Total Bilirubin 0.0 -  1.2 mg/dL 0.4   0.3   0.5    Alkaline Phos 44 - 121 IU/L 81   101   86    AST 0 - 40 IU/L '18   28   22    '$ ALT 0 - 32 IU/L '10   13   16        '$ Latest  Ref Rng & Units 06/12/2021   10:27 AM 11/15/2020    3:56 PM 11/09/2019    3:18 PM  CBC  WBC 3.4 - 10.8 x10E3/uL 4.2   4.1   4.3    Hemoglobin 11.1 - 15.9 g/dL 12.1   12.6   12.8    Hematocrit 34.0 - 46.6 % 37.2   39.9   38.5    Platelets 150 - 450 x10E3/uL 516   306   275      Lipid Panel    Component Value Date/Time   CHOL 188 11/15/2020 1556   TRIG 73 11/15/2020 1556   HDL 75 11/15/2020 1556   CHOLHDL 2.5 11/15/2020 1556   LDLCALC 100 (H) 11/15/2020 1556    HEMOGLOBIN A1C No results found for: HGBA1C, MPG TSH Recent Labs    08/06/21 1642  TSH 3.060    Allergies  No Known Allergies   Medications Prior to Visit:   Outpatient Medications Prior to Visit  Medication Sig Dispense Refill   aspirin EC 81 MG tablet Take 81 mg by mouth daily.     diltiazem (CARDIZEM CD) 180 MG 24 hr capsule Take 1 capsule (180 mg total) by mouth daily. 30 capsule 3   erythromycin ophthalmic ointment Place a 1/2 inch ribbon of ointment into the lower eyelid BID. 3.5 g 0   omeprazole (PRILOSEC) 20 MG capsule Take 20 mg by mouth daily.     Riociguat (ADEMPAS) 2 MG TABS Take 1 tablet by mouth 3 (three) times daily. 252 tablet 0   Facility-Administered Medications Prior to Visit  Medication Dose Route Frequency Provider Last Rate Last Admin   0.9 %  sodium chloride infusion  500 mL Intravenous Once Mauri Pole, MD       Final Medications at End of Visit    No outpatient medications have been marked as taking for the 08/21/21 encounter (Appointment) with Nancy Lambert, Nancy Jhaveri Lambert, Nancy Lambert.   Current Facility-Administered Medications for the 08/21/21 encounter (Appointment) with Nancy Berthold, Nancy Lambert  Medication   0.9 %  sodium chloride infusion   Radiology:   No results found.  Cardiac Studies:   Lexiscan myoview stress test 06/18/2015: 1. Resting EKG demonstrates normal sinus rhythm, left axis deviation, cannot exclude inferior infarct old, Anterior infarct old.  Stress EKG was negative  for myocardial ischemia.  Patient exercised for 4 minutes and 5 seconds and achieved 5.93 Mets.  There are frequent PVCs in the recovery phase of the stress test.  Stress terminated due to achieving target heart rate, 86% of MPHR.  Stress symptoms included dyspnea. 2. Myocardial perfusion imaging is normal. Overall left ventricular systolic function was normal without regional wall motion abnormalities. The left ventricular ejection fraction was 62%.  Right heart cath 07/24/2015: Right Heart Pressure:  RA A Wave  13 mmHg,   RA V Wave  9 mmHg,   RA Mean  8 mmHg  RV Systolic Pressure  48 mmHg,   RV Diastolic Pressure  4 mmHg, RV EDP  11 mmHg  PA Systolic Pressure  48 mmHg,   PA Diastolic Pressure  14 mmHg,  PA Mean  29 mmHg. PW A Wave  19 mmHg,   PW V Wave  17 mmHg,   PW Mean  15 mmHg  QP/QS  1    Total pulmonary vascular resistance 7 Wood units.   Impression: Mild to moderate pulmonary hypertension. Wedge pressure although normal, in the upper end of spectrum suggesting the pulmonary hypertension could also be related to mild LV diastolic heart failure. Findings probably more consistent with primary pulmonary hypertension. Clinical correlation recommended.   Lower Venous DVT Study 07/14/2019: BILATERAL:  - No evidence of deep vein thrombosis seen in the lower extremities, bilaterally.   PCV ECHOCARDIOGRAM COMPLETE 65/78/4696 Normal LV systolic function with EF 63%. Moderate concentric hypertrophy of the left ventricle. Normal global wall motion. Left ventricle cavity is normal in size. Indeterminate diastolic filling pattern. Left atrial cavity is moderately dilated. Right atrial cavity is moderately dilated. Mild to moderate mitral regurgitation. Mild to moderate tricuspid regurgitation. Estimated pulmonary artery systolic pressure 29 mmHg. Mild pulmonic regurgitation. Moderate pericardial effusion. There is no hemodynamic significance. No significant change compared to previous study on  01/14/2021.  EKG  ***   12/11/2020: Sinus bradycardia at a rate of 57 bpm.  Left axis, left anterior fascicular block.  Right atrial enlargement.  Poor R wave progression, cannot exclude anteroseptal infarct old.  LVH with secondary repolarization abnormality.   09/09/2019: Sinus bradycardia at rate of 58 bpm, right atrial enlargement, left axis deviation, left anterior fascicular block.  Poor R wave progression, cannot exclude anteroseptal infarct old.  IVCD, LVH with repolarization abnormality, cannot exclude high lateral ischemia.  No significant change from 09/28/2018  Assessment   No diagnosis found.   No orders of the defined types were placed in this encounter.  There are no discontinued medications.    Six Minute Walk - 12/11/2020               Six Minute Walk     Supplemental oxygen during test? No      Lap distance in meters  20 meters      Laps Completed  14     Partial lap (in meters) 0 meters      Baseline Heartrate 72      Baseline SPO2 100 %            Interval Oxygen Saturation and HR      2 Minute Oxygen Saturation % 98 %      2 Minute HR 81     4 Minute Oxygen Saturation % 98 %      4 Minute HR 78      6 Minute Oxygen Saturation % 98 %      6 Minute HR 81           End of Test Values     Heartrate 81      SPO2 98 %            Interpretation     Distance completed 280 meters      Tech Comments: Patient completed all laps without complications (without stopping)    09/09/2019 Distance completed 300 meters  Recommendations:   Nancy Lambert  is a 81 y.o. 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 She is on Adempas for pulm HTN and has chronic dyspnea, lumbar radiculopathy.  Patient presents for 30-monthfollow-up.  Last office visit given pericardial effusion ordered repeat echocardiogram which revealed stable  moderate pericardial  effusion with no hemodynamic significance.  At last office visit patient was otherwise stable and no changes were made. ***  ***  Patient presents for follow-up to discuss recent echocardiogram results.  Patient had an echocardiogram 01/14/2021 which showed grade 2 diastolic dysfunction, which was previously grade 1 and pericardial effusion increased from small to moderate.  Patient is overall stable from a dermatology standpoint and physical exam is unchanged compared to previous office visit.  Her blood pressure is mildly elevated today, however suspect this is related to stress and anxiety.  We will hold off on making medication changes at this time.  However did advise patient to monitor blood pressure on a regular basis at home and notify our office if it remains >40/90 mmHg.  There is no evidence of cardiac tamponade at today's office visit and patient is hemodynamically stable.  Reviewed and discussed at length results of echocardiogram, details above.  Patient verbalized understanding and her questions were addressed.  Will not make changes to patient's medications at this time.  However will repeat echocardiogram and 6 months to reevaluate pericardial effusion.  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  Follow-up in 6 months, sooner if needed, for pulmonary hypertension, pericardial effusion.   Nancy Berthold, Nancy Lambert 08/19/2021, 1:13 PM Office: [phone]

## 2021-08-21 ENCOUNTER — Encounter: Payer: Self-pay | Admitting: Student

## 2021-08-21 ENCOUNTER — Ambulatory Visit: Payer: Medicare HMO | Admitting: Student

## 2021-08-21 VITALS — BP 181/86 | HR 55 | Temp 98.0°F | Resp 16 | Ht 64.0 in | Wt 146.2 lb

## 2021-08-21 DIAGNOSIS — I272 Pulmonary hypertension, unspecified: Secondary | ICD-10-CM | POA: Diagnosis not present

## 2021-08-21 DIAGNOSIS — I3139 Other pericardial effusion (noninflammatory): Secondary | ICD-10-CM | POA: Diagnosis not present

## 2021-08-21 DIAGNOSIS — I10 Essential (primary) hypertension: Secondary | ICD-10-CM

## 2021-08-21 MED ORDER — AMLODIPINE BESYLATE 5 MG PO TABS: 5.0000 mg | ORAL_TABLET | Freq: Every day | ORAL | 3 refills | Status: DC

## 2021-08-21 NOTE — Progress Notes (Signed)
EKG

## 2021-08-23 ENCOUNTER — Encounter: Payer: Self-pay | Admitting: Internal Medicine

## 2021-08-27 ENCOUNTER — Ambulatory Visit: Payer: Medicare HMO

## 2021-08-27 ENCOUNTER — Ambulatory Visit (INDEPENDENT_AMBULATORY_CARE_PROVIDER_SITE_OTHER): Payer: Medicare HMO

## 2021-08-27 VITALS — BP 116/76 | HR 67 | Temp 98.2°F | Ht 64.0 in | Wt 146.0 lb

## 2021-08-27 DIAGNOSIS — E538 Deficiency of other specified B group vitamins: Secondary | ICD-10-CM

## 2021-08-27 MED ORDER — CYANOCOBALAMIN 1000 MCG/ML IJ SOLN
1000.0000 ug | Freq: Once | INTRAMUSCULAR | Status: AC
  Administered 2021-08-27: 1000 ug via INTRAMUSCULAR

## 2021-08-27 NOTE — Progress Notes (Signed)
Patient presents today for her first vitamin B12 injection. YL,RMA

## 2021-09-03 ENCOUNTER — Ambulatory Visit (INDEPENDENT_AMBULATORY_CARE_PROVIDER_SITE_OTHER): Payer: Medicare HMO

## 2021-09-03 VITALS — BP 130/70 | HR 60 | Temp 98.4°F

## 2021-09-03 DIAGNOSIS — E538 Deficiency of other specified B group vitamins: Secondary | ICD-10-CM

## 2021-09-03 MED ORDER — CYANOCOBALAMIN 1000 MCG/ML IJ SOLN
1000.0000 ug | Freq: Once | INTRAMUSCULAR | Status: AC
  Administered 2021-09-03: 1000 ug via INTRAMUSCULAR

## 2021-09-03 NOTE — Progress Notes (Signed)
Patient presents today for 2nd b12 injection.  

## 2021-09-04 ENCOUNTER — Ambulatory Visit: Payer: Medicare HMO

## 2021-09-04 NOTE — Progress Notes (Deleted)
BP 176/82  HR 57  O2 95%    Increased amldipine from 5 mg to 10 mg daily. Repeat BP check in 2 weeks.

## 2021-09-10 ENCOUNTER — Ambulatory Visit (INDEPENDENT_AMBULATORY_CARE_PROVIDER_SITE_OTHER): Payer: Medicare HMO

## 2021-09-10 ENCOUNTER — Ambulatory Visit: Payer: Medicare HMO

## 2021-09-10 VITALS — BP 128/70 | HR 60 | Temp 98.3°F

## 2021-09-10 DIAGNOSIS — E538 Deficiency of other specified B group vitamins: Secondary | ICD-10-CM | POA: Diagnosis not present

## 2021-09-10 MED ORDER — CYANOCOBALAMIN 1000 MCG/ML IJ SOLN
1000.0000 ug | Freq: Once | INTRAMUSCULAR | Status: AC
  Administered 2021-09-10: 1000 ug via INTRAMUSCULAR

## 2021-09-10 NOTE — Progress Notes (Signed)
Patient presents today for 3rd b12 injection.  

## 2021-09-17 ENCOUNTER — Ambulatory Visit: Payer: Medicare HMO

## 2021-09-18 ENCOUNTER — Encounter: Payer: Self-pay | Admitting: Student

## 2021-09-18 ENCOUNTER — Ambulatory Visit: Payer: Medicare HMO | Admitting: Student

## 2021-09-18 VITALS — BP 134/63 | HR 66 | Temp 98.0°F | Resp 16 | Ht 64.0 in | Wt 135.0 lb

## 2021-09-18 DIAGNOSIS — I10 Essential (primary) hypertension: Secondary | ICD-10-CM

## 2021-09-18 MED ORDER — AMLODIPINE BESYLATE 10 MG PO TABS
10.0000 mg | ORAL_TABLET | Freq: Every day | ORAL | 3 refills | Status: DC
Start: 2021-09-18 — End: 2022-01-06

## 2021-09-18 NOTE — Progress Notes (Signed)
Primary Physician/Referring:  Glendale Chard, MD  Nancy Lambert ID: Nancy Lambert, female    DOB: 06-10-1940, 81 y.o.   MRN: [id number]  Chief Complaint  Nancy Lambert presents with   Hypertension   Follow-up    2 weeks   HPI:    Nancy Lambert  is a 81 y.o. 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 Nancy Lambert is on Adempas for pulm HTN and has chronic dyspnea, lumbar radiculopathy.   Nancy Lambert was last seen in the office 08/21/2021 at which time blood pressure was uncontrolled therefore switched Nancy Lambert from diltiazem to amlodipine.  Nancy Lambert subsequently presented to the office for blood pressure recheck, it remained uncontrolled therefore increased amlodipine from 5 mg to 10 mg p.o. daily.  Nancy Lambert now presents for follow-up of hypertension.  Blood pressure is now well controlled on amlodipine 10 mg daily and Nancy Lambert is feeling well overall, tolerating medication without issue.  Nancy Lambert is now walking 3 days/week with her daughter as well.  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[place])    Pulmonary hypertension (HCC)    Scleroderma ([place])    Status post ablation of incompetent vein using laser bilateral leg 01/2019   Past Surgical History:  Procedure Laterality Date   CARDIAC CATHETERIZATION N/A 07/24/2015   Procedure: Right Heart Cath;  Surgeon: Adrian Prows, MD;  Location: [facility];  Service: Cardiovascular;  Laterality: N/A;   COLONOSCOPY     steroid shots  2020   for pain   VESICOVAGINAL FISTULA CLOSURE W/ TAH     Family History  Problem Relation Age of Onset   Breast cancer Mother    Clotting disorder Mother    Diabetes Mother    Breast cancer Sister    Clotting disorder Sister    Prostate cancer Son    Breast cancer Daughter    Other  Father        drowned   Breast cancer Sister    Breast cancer Other    Colon cancer Neg Hx    Colon polyps Neg Hx    Esophageal cancer Neg Hx    Rectal cancer Neg Hx    Stomach cancer Neg Hx     Social History   Tobacco Use   Smoking status: Former    Packs/day: 0.25    Years: 5.00    Total pack years: 1.25    Types: Cigarettes    Quit date: 1971    Years since quitting: 52.5   Smokeless tobacco: Never   Tobacco comments:    very light use when Nancy Lambert did smoke  Substance Use Topics   Alcohol use: Yes    Alcohol/week: 3.0 - 5.0 standard drinks of alcohol    Types: 3 - 5 Standard drinks or equivalent per week    Comment: occasional   Marital Status: Widowed  ROS  Review of Systems  Cardiovascular:  Positive for dyspnea on exertion (stable). Negative for chest pain, claudication, leg swelling, near-syncope, orthopnea, palpitations, paroxysmal nocturnal dyspnea and syncope.  Musculoskeletal:  Positive for arthritis, back pain and joint pain.  Gastrointestinal:  Negative for melena.   Objective  Blood pressure 134/63, pulse 66, temperature 98 F (36.7 C), temperature source Temporal, resp. rate 16, height '5\' 4"'$  (1.626 m), weight 135  lb (61.2 kg), SpO2 97 %.     09/18/2021    2:50 PM 09/18/2021    2:44 PM 09/10/2021    3:03 PM  Vitals with BMI  Height  '5\' 4"'$    Weight  135 lbs   BMI  36.14   Systolic 431 540 086  Diastolic 63 59 70  Pulse 66 63 60     Physical Exam Vitals reviewed.  Constitutional:      General: Nancy Lambert is not in acute distress.    Appearance: Nancy Lambert is well-developed.  Neck:     Vascular: No carotid bruit.  Cardiovascular:     Rate and Rhythm: Normal rate and regular rhythm.     Pulses: Intact distal pulses.          Carotid pulses are 2+ on the right side and 2+ on the left side.      Radial pulses are 2+ on the right side and 2+ on the left side.       Dorsalis pedis pulses are 1+ on the right side and 1+ on the left side.       Posterior tibial  pulses are 0 on the right side and 0 on the left side.     Heart sounds: Murmur heard.     Harsh midsystolic murmur is present with a grade of 2/6 at the upper right sternal border.     No gallop.     Comments: S1 normal, S2 is  Loud and accentuated. No JVD.  No evidence of cardiac tamponade.  Nancy Lambert is hemodynamically stable. Pulmonary:     Effort: Pulmonary effort is normal. No accessory muscle usage.     Breath sounds: Normal breath sounds.  Abdominal:     Palpations: There is hepatomegaly.  Musculoskeletal:     Right lower leg: No edema.     Left lower leg: No edema.  Skin:    Comments: Abnormally dry due to scleroderma   Physical exam unchanged compared to previous office visit.  Laboratory examination:   Recent Labs    11/15/20 1556 06/12/21 1027  NA 143 144  K 3.7 3.8  CL 102 102  CO2 27 28  GLUCOSE 87 85  BUN 14 12  CREATININE 0.67 0.70  CALCIUM 9.9 9.7   CrCl cannot be calculated (Nancy Lambert's most recent lab result is older than the maximum 21 days allowed.).     Latest Ref Rng & Units 06/12/2021   10:27 AM 11/15/2020    3:56 PM 11/09/2019    3:18 PM  CMP  Glucose 70 - 99 mg/dL 85  87  81   BUN 8 - 27 mg/dL '12  14  12   '$ Creatinine 0.57 - 1.00 mg/dL 0.70  0.67  0.80   Sodium 134 - 144 mmol/L 144  143  142   Potassium 3.5 - 5.2 mmol/L 3.8  3.7  4.5   Chloride 96 - 106 mmol/L 102  102  103   CO2 20 - 29 mmol/L '28  27  25   '$ Calcium 8.7 - 10.3 mg/dL 9.7  9.9  9.9   Total Protein 6.0 - 8.5 g/dL 6.9  7.1  6.8   Total Bilirubin 0.0 - 1.2 mg/dL 0.4  0.3  0.5   Alkaline Phos 44 - 121 IU/L 81  101  86   AST 0 - 40 IU/L '18  28  22   '$ ALT 0 - 32 IU/L 10  13  16  Latest Ref Rng & Units 06/12/2021   10:27 AM 11/15/2020    3:56 PM 11/09/2019    3:18 PM  CBC  WBC 3.4 - 10.8 x10E3/uL 4.2  4.1  4.3   Hemoglobin 11.1 - 15.9 g/dL 12.1  12.6  12.8   Hematocrit 34.0 - 46.6 % 37.2  39.9  38.5   Platelets 150 - 450 x10E3/uL 516  306  275     Lipid Panel    Component  Value Date/Time   CHOL 188 11/15/2020 1556   TRIG 73 11/15/2020 1556   HDL 75 11/15/2020 1556   CHOLHDL 2.5 11/15/2020 1556   LDLCALC 100 (H) 11/15/2020 1556    HEMOGLOBIN A1C No results found for: "HGBA1C", "MPG" TSH Recent Labs    08/06/21 1642  TSH 3.060    Allergies  No Known Allergies   Medications Prior to Visit:   Outpatient Medications Prior to Visit  Medication Sig Dispense Refill   aspirin EC 81 MG tablet Take 81 mg by mouth daily.     omeprazole (PRILOSEC) 20 MG capsule Take 20 mg by mouth daily.     Riociguat (ADEMPAS) 2 MG TABS Take 1 tablet by mouth 3 (three) times daily. 252 tablet 0   amLODipine (NORVASC) 5 MG tablet Take 1 tablet (5 mg total) by mouth daily. 30 tablet 3   Facility-Administered Medications Prior to Visit  Medication Dose Route Frequency Provider Last Rate Last Admin   0.9 %  sodium chloride infusion  500 mL Intravenous Once Nandigam, Venia Minks, MD       Final Medications at End of Visit    Current Meds  Medication Sig   aspirin EC 81 MG tablet Take 81 mg by mouth daily.   omeprazole (PRILOSEC) 20 MG capsule Take 20 mg by mouth daily.   Riociguat (ADEMPAS) 2 MG TABS Take 1 tablet by mouth 3 (three) times daily.   [DISCONTINUED] amLODipine (NORVASC) 5 MG tablet Take 1 tablet (5 mg total) by mouth daily.   Current Facility-Administered Medications for the 09/18/21 encounter (Office Visit) with Alethia Berthold, PA-C  Medication   0.9 %  sodium chloride infusion   Radiology:   No results found.  Cardiac Studies:   Lexiscan myoview stress test 06/18/2015: 1. Resting EKG demonstrates normal sinus rhythm, left axis deviation, cannot exclude inferior infarct old, Anterior infarct old.  Stress EKG was negative for myocardial ischemia.  Nancy Lambert exercised for 4 minutes and 5 seconds and achieved 5.93 Mets.  There are frequent PVCs in the recovery phase of the stress test.  Stress terminated due to achieving target heart rate, 86% of MPHR.   Stress symptoms included dyspnea. 2. Myocardial perfusion imaging is normal. Overall left ventricular systolic function was normal without regional wall motion abnormalities. The left ventricular ejection fraction was 62%.  Right heart cath 07/24/2015: Right Heart Pressure:  RA A Wave  13 mmHg,   RA V Wave  9 mmHg,   RA Mean  8 mmHg  RV Systolic Pressure  48 mmHg,   RV Diastolic Pressure  4 mmHg, RV EDP  11 mmHg  PA Systolic Pressure  48 mmHg,   PA Diastolic Pressure  14 mmHg,   PA Mean  29 mmHg. PW A Wave  19 mmHg,   PW V Wave  17 mmHg,   PW Mean  15 mmHg  QP/QS  1    Total pulmonary vascular resistance 7 Wood units.   Impression: Mild to moderate pulmonary hypertension. Wedge pressure  although normal, in the upper end of spectrum suggesting the pulmonary hypertension could also be related to mild LV diastolic heart failure. Findings probably more consistent with primary pulmonary hypertension. Clinical correlation recommended.   Lower Venous DVT Study 07/14/2019: BILATERAL:  - No evidence of deep vein thrombosis seen in the lower extremities, bilaterally.   PCV ECHOCARDIOGRAM COMPLETE 68/10/8108 Normal LV systolic function with EF 63%. Moderate concentric hypertrophy of the left ventricle. Normal global wall motion. Left ventricle cavity is normal in size. Indeterminate diastolic filling pattern. Left atrial cavity is moderately dilated. Right atrial cavity is moderately dilated. Mild to moderate mitral regurgitation. Mild to moderate tricuspid regurgitation. Estimated pulmonary artery systolic pressure 29 mmHg. Mild pulmonic regurgitation. Moderate pericardial effusion. There is no hemodynamic significance. No significant change compared to previous study on 01/14/2021.  EKG  08/21/2021: Sinus rhythm with rate variation and a rate of 53 bpm.  Left axis, left anterior fascicular block.  Right atrial enlargement.  Poor R wave progression, cannot exclude anteroseptal infarct old.  LVH with  secondary repolarization abnormality.  Unchanged compared to EKG 12/11/2020.  09/09/2019: Sinus bradycardia at rate of 58 bpm, right atrial enlargement, left axis deviation, left anterior fascicular block.  Poor R wave progression, cannot exclude anteroseptal infarct old.  IVCD, LVH with repolarization abnormality, cannot exclude high lateral ischemia.  No significant change from 09/28/2018  Assessment     ICD-10-CM   1. Primary hypertension  I10       Meds ordered this encounter  Medications   amLODipine (NORVASC) 10 MG tablet    Sig: Take 1 tablet (10 mg total) by mouth daily.    Dispense:  30 tablet    Refill:  3   Medications Discontinued During This Encounter  Medication Reason   amLODipine (NORVASC) 5 MG tablet    Recommendations:   Nancy Lambert  is a 81 y.o. 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 Nancy Lambert is on Adempas for pulm HTN and has chronic dyspnea, lumbar radiculopathy.  Nancy Lambert was last seen in the office 08/21/2021 at which time blood pressure was uncontrolled therefore switched Nancy Lambert from diltiazem to amlodipine.  Nancy Lambert now presents for follow-up of hypertension.  Nancy Lambert's blood pressure is presently well controlled.  We will continue amlodipine 10 mg p.o. daily.  Nancy Lambert is otherwise stable from a cardiovascular standpoint.  We will keep follow-up appointment as previously scheduled.   Alethia Berthold, PA-C 09/18/2021, 3:19 PM Office: [phone]

## 2021-10-26 ENCOUNTER — Other Ambulatory Visit: Payer: Self-pay | Admitting: Gastroenterology

## 2021-10-28 ENCOUNTER — Telehealth: Payer: Self-pay | Admitting: Gastroenterology

## 2021-10-28 MED ORDER — OMEPRAZOLE 20 MG PO CPDR: 20.0000 mg | DELAYED_RELEASE_CAPSULE | Freq: Every day | ORAL | 2 refills | Status: DC

## 2021-10-28 NOTE — Telephone Encounter (Signed)
Patient called and needs a refill of omeprazole.  Thank you.

## 2021-10-28 NOTE — Telephone Encounter (Signed)
Omeprazole sent to patients pharmacy.  

## 2021-11-05 ENCOUNTER — Telehealth: Payer: Self-pay | Admitting: Gastroenterology

## 2021-11-05 NOTE — Telephone Encounter (Signed)
PT is calling about Omeprazole. Says she was recently taking '40mg'$  but now its at '20mg'$ . Wants to know if she should take 2 pills or the 1 as prescribed. Please reach out to advise. Thank you

## 2021-11-07 NOTE — Telephone Encounter (Signed)
Patient is following up on message below. Please advise.

## 2021-11-07 NOTE — Telephone Encounter (Signed)
Spoke with the patient. She does not recall the Omeprazole being decreased to 20 mg daily. I cannot find in her chart where Dr Silverio Decamp  or another doctor decreasing the Omeprazole. The patient wants to try taking the Omeprazole 20 mg daily. If she has a return of her GERD symptoms, she will increase the Omeprazole to 40 mg daily in the morning ac breakfast and let this office know she has had to increase the dose.

## 2021-11-21 ENCOUNTER — Ambulatory Visit: Payer: Medicare HMO | Admitting: Cardiology

## 2021-11-21 ENCOUNTER — Ambulatory Visit (INDEPENDENT_AMBULATORY_CARE_PROVIDER_SITE_OTHER): Payer: Medicare HMO | Admitting: Internal Medicine

## 2021-11-21 ENCOUNTER — Encounter: Payer: Self-pay | Admitting: Internal Medicine

## 2021-11-21 VITALS — BP 120/76 | HR 72 | Temp 98.3°F | Ht 64.0 in | Wt 136.2 lb

## 2021-11-21 DIAGNOSIS — Z23 Encounter for immunization: Secondary | ICD-10-CM | POA: Diagnosis not present

## 2021-11-21 DIAGNOSIS — I10 Essential (primary) hypertension: Secondary | ICD-10-CM

## 2021-11-21 DIAGNOSIS — Z0000 Encounter for general adult medical examination without abnormal findings: Secondary | ICD-10-CM

## 2021-11-21 DIAGNOSIS — Z6823 Body mass index (BMI) 23.0-23.9, adult: Secondary | ICD-10-CM

## 2021-11-21 DIAGNOSIS — M349 Systemic sclerosis, unspecified: Secondary | ICD-10-CM | POA: Diagnosis not present

## 2021-11-21 DIAGNOSIS — I272 Pulmonary hypertension, unspecified: Secondary | ICD-10-CM

## 2021-11-21 DIAGNOSIS — Z79899 Other long term (current) drug therapy: Secondary | ICD-10-CM | POA: Diagnosis not present

## 2021-11-21 DIAGNOSIS — R296 Repeated falls: Secondary | ICD-10-CM | POA: Diagnosis not present

## 2021-11-21 DIAGNOSIS — L84 Corns and callosities: Secondary | ICD-10-CM

## 2021-11-21 DIAGNOSIS — Z2821 Immunization not carried out because of patient refusal: Secondary | ICD-10-CM

## 2021-11-21 DIAGNOSIS — R634 Abnormal weight loss: Secondary | ICD-10-CM | POA: Diagnosis not present

## 2021-11-21 LAB — POCT URINALYSIS DIPSTICK
Bilirubin, UA: NEGATIVE
Glucose, UA: NEGATIVE
Ketones, UA: NEGATIVE
Nitrite, UA: NEGATIVE
Protein, UA: NEGATIVE
Spec Grav, UA: 1.015 (ref 1.010–1.025)
Urobilinogen, UA: 0.2 E.U./dL
pH, UA: 6.5 (ref 5.0–8.0)

## 2021-11-21 NOTE — Patient Instructions (Signed)

## 2021-11-21 NOTE — Progress Notes (Incomplete)
Rich Brave [place],acting as a Education administrator for Maximino Greenland, MD.,have documented all relevant documentation on the behalf of Maximino Greenland, MD,as directed by  Maximino Greenland, MD while in the presence of Maximino Greenland, MD.   Subjective:     Patient ID: Nancy Lambert , female    DOB: [birth year] , 81 y.o.   MRN: [id number]   Chief Complaint  Patient presents with  . Annual Exam    HPI  The patient is here today for a physical examination.  She reports compliance with meds.  She denies headaches, chest pain and shortness of breath.      Past Medical History:  Diagnosis Date  . Anxiety   . Cardiac arrhythmia   . Glaucoma   . Hyperlipidemia   . Hypertension   . 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[place])   . Pulmonary hypertension ([place])   . Scleroderma ([place])   . 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 Breast cancer Mother   . Clotting disorder Mother   . Diabetes Mother   . Breast cancer Sister   . Clotting disorder Sister   . Prostate cancer Son   . Breast cancer Daughter   . Other Father        drowned  . Breast cancer Sister   . Breast cancer Other   . Colon cancer Neg Hx   . Colon polyps Neg Hx   . Esophageal cancer Neg Hx   . Rectal cancer Neg Hx   . Stomach cancer Neg Hx      Current Outpatient Medications:  .  amLODipine (NORVASC) 10 MG tablet, Take 1 tablet (10 mg total) by mouth daily., Disp: 30 tablet, Rfl: 3 .  aspirin EC 81 MG tablet, Take 81 mg by mouth daily., Disp: , Rfl:  .  omeprazole (PRILOSEC) 20 MG capsule, Take 1 capsule (20 mg total) by mouth daily., Disp: 90 capsule, Rfl: 2 .  Riociguat (ADEMPAS) 2 MG TABS, Take 1 tablet by mouth 3 (three) times daily., Disp: 252 tablet, Rfl: 0  Current Facility-Administered Medications:  .  0.9 %  sodium chloride infusion, 500 mL, Intravenous, Once, Nandigam, Kavitha V, MD   No Known Allergies    The  patient states she uses {contraceptive methods:5051} for birth control. Last LMP was No LMP recorded. Patient is postmenopausal.. {Dysmenorrhea-menorrhagia:21918}. Negative for: breast discharge, breast lump(s), breast pain and breast self exam. Associated symptoms include abnormal vaginal bleeding. Pertinent negatives include abnormal bleeding (hematology), anxiety, decreased libido, depression, difficulty falling sleep, dyspareunia, history of infertility, nocturia, sexual dysfunction, sleep disturbances, urinary incontinence, urinary urgency, vaginal discharge and vaginal itching. Diet regular.The patient states her exercise level is    . The patient's tobacco use is:  Social History   Tobacco Use  Smoking Status Former  . Packs/day: 0.25  . Years: 5.00  . Total pack years: 1.25  . Types: Cigarettes  . Quit date: 74  . Years since quitting: 52.6  Smokeless Tobacco Never  Tobacco Comments   very light use when she did smoke  . She has been exposed to passive smoke. The patient's alcohol use is:  Social History   Substance and Sexual Activity  Alcohol Use Yes  . Alcohol/week: 3.0 - 5.0 standard drinks of alcohol  . Types: 3 - 5 Standard drinks or equivalent per week   Comment: occasional  . Additional information: Last  pap ***, next one scheduled for ***.    Review of Systems  Constitutional: Negative.   HENT: Negative.    Eyes: Negative.   Respiratory: Negative.    Cardiovascular: Negative.   Gastrointestinal: Negative.   Endocrine: Negative.   Genitourinary: Negative.   Musculoskeletal: Negative.   Skin: Negative.   Allergic/Immunologic: Negative.   Neurological: Negative.   Hematological: Negative.   Psychiatric/Behavioral: Negative.       Today's Vitals   11/21/21 0953  BP: 120/76  Pulse: 72  Temp: 98.3 F (36.8 C)  Weight: 136 lb 3.2 oz (61.8 kg)  Height: '5\' 4"'$  (1.626 m)  PainSc: 0-No pain   Body mass index is 23.38 kg/m.  Wt Readings from Last 3  Encounters:  11/21/21 136 lb 3.2 oz (61.8 kg)  09/18/21 135 lb (61.2 kg)  08/27/21 146 lb (66.2 kg)     Objective:  Physical Exam      Assessment And Plan:     1. Encounter for general adult medical examination w/o abnormal findings  2. Pulmonary hypertension (HCC) - Microalbumin / Creatinine Urine Ratio - POCT Urinalysis Dipstick (81002)  3. Scleroderma ([place])  4. BMI 23.0-23.9, adult  5. Herpes zoster vaccination declined  6. Immunization due     Patient was given opportunity to ask questions. Patient verbalized understanding of the plan and was able to repeat key elements of the plan. All questions were answered to their satisfaction.   Maximino Greenland, MD   I, Maximino Greenland, MD, have reviewed all documentation for this visit. The documentation on 11/21/21 for the exam, diagnosis, procedures, and orders are all accurate and complete.   THE PATIENT IS ENCOURAGED TO PRACTICE SOCIAL DISTANCING DUE TO THE COVID-19 PANDEMIC.

## 2021-11-21 NOTE — Progress Notes (Signed)
Rich Brave [place],acting as a Education administrator for Maximino Greenland, MD.,have documented all relevant documentation on the behalf of Maximino Greenland, MD,as directed by  Maximino Greenland, MD while in the presence of Maximino Greenland, MD.   Subjective:     Patient ID: Nancy Lambert , female    DOB: [birth year] , 81 y.o.   MRN: [id number]   Chief Complaint  Patient presents with   Annual Exam    HPI  The patient is here today for a physical examination.  She reports compliance with meds.  She admits she doesn't get a lot of exercise, but does move around her home a lot. She does admit to frequent falls, feels she may be "off balance'.  She denies headaches, palpitations and chest pain.      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[place])    Pulmonary hypertension (HCC)    Scleroderma (HCC)    Status post ablation of incompetent vein using laser bilateral leg 01/2019     Family History  Problem Relation Age of Onset   Breast cancer Mother    Clotting disorder Mother    Diabetes Mother    Breast cancer Sister    Clotting disorder Sister    Prostate cancer Son    Breast cancer Daughter    Other Father        drowned   Breast cancer Sister    Breast cancer Other    Colon cancer Neg Hx    Colon polyps Neg Hx    Esophageal cancer Neg Hx    Rectal cancer Neg Hx    Stomach cancer Neg Hx      Current Outpatient Medications:    amLODipine (NORVASC) 10 MG tablet, Take 1 tablet (10 mg total) by mouth daily., Disp: 30 tablet, Rfl: 3   aspirin EC 81 MG tablet, Take 81 mg by mouth daily., Disp: , Rfl:    omeprazole (PRILOSEC) 20 MG capsule, Take 1 capsule (20 mg total) by mouth daily., Disp: 90 capsule, Rfl: 2   Riociguat (ADEMPAS) 2 MG TABS, Take 1 tablet by mouth 3 (three) times daily., Disp: 252 tablet, Rfl: 0  Current Facility-Administered Medications:    0.9 %   sodium chloride infusion, 500 mL, Intravenous, Once, Nandigam, Kavitha V, MD   No Known Allergies    The patient states she uses post menopausal status for birth control. Last LMP was No LMP recorded. Patient is postmenopausal.. Negative for Dysmenorrhea. Negative for: breast discharge, breast lump(s), breast pain and breast self exam. Associated symptoms include abnormal vaginal bleeding. Pertinent negatives include abnormal bleeding (hematology), anxiety, decreased libido, depression, difficulty falling sleep, dyspareunia, history of infertility, nocturia, sexual dysfunction, sleep disturbances, urinary incontinence, urinary urgency, vaginal discharge and vaginal itching. Diet regular.The patient states her exercise level is  minimal.  . The patient's tobacco use is:  Social History   Tobacco Use  Smoking Status Former   Packs/day: 0.25   Years: 5.00   Total pack years: 1.25   Types: Cigarettes   Quit date: 1971   Years since quitting: 52.6  Smokeless Tobacco Never  Tobacco Comments   very light use when she did smoke  . She has been exposed to passive smoke. The patient's alcohol use is:  Social History   Substance and Sexual Activity  Alcohol Use Yes  Alcohol/week: 3.0 - 5.0 standard drinks of alcohol   Types: 3 - 5 Standard drinks or equivalent per week   Comment: occasional   Review of Systems  Constitutional: Negative.   HENT: Negative.    Eyes: Negative.   Respiratory: Negative.    Cardiovascular: Negative.   Gastrointestinal: Negative.   Endocrine: Negative.   Genitourinary: Negative.   Musculoskeletal: Negative.   Skin: Negative.   Allergic/Immunologic: Negative.   Neurological: Negative.        She feels off balance. She denies dizziness.   Hematological: Negative.   Psychiatric/Behavioral: Negative.       Today's Vitals   11/21/21 0953  BP: 120/76  Pulse: 72  Temp: 98.3 F (36.8 C)  Weight: 136 lb 3.2 oz (61.8 kg)  Height: '5\' 4"'  (1.626 m)  PainSc:  0-No pain   Body mass index is 23.38 kg/m.  Wt Readings from Last 3 Encounters:  11/21/21 136 lb 3.2 oz (61.8 kg)  09/18/21 135 lb (61.2 kg)  08/27/21 146 lb (66.2 kg)     Objective:  Physical Exam Vitals and nursing note reviewed.  Constitutional:      Appearance: Normal appearance.  HENT:     Head: Normocephalic and atraumatic.     Right Ear: Tympanic membrane, ear canal and external ear normal.     Left Ear: Tympanic membrane, ear canal and external ear normal.     Nose: Nose normal.     Mouth/Throat:     Mouth: Mucous membranes are moist.     Pharynx: Oropharynx is clear.  Eyes:     Extraocular Movements: Extraocular movements intact.     Conjunctiva/sclera: Conjunctivae normal.     Pupils: Pupils are equal, round, and reactive to light.  Cardiovascular:     Rate and Rhythm: Normal rate and regular rhythm.     Pulses: Normal pulses.     Heart sounds: Murmur heard.  Pulmonary:     Effort: Pulmonary effort is normal.     Breath sounds: Normal breath sounds.  Abdominal:     General: Abdomen is flat. Bowel sounds are normal.     Palpations: Abdomen is soft.  Genitourinary:    Comments: deferred Musculoskeletal:        General: Normal range of motion.     Cervical back: Normal range of motion and neck supple.     Comments: Skin thickening on fingers. No ulceration on fingers. MTP swelling b/l  Feet:     Right foot:     Skin integrity: Callus and dry skin present.     Left foot:     Skin integrity: Callus and dry skin present.  Skin:    General: Skin is warm and dry.     Comments: Scattered erythematous lesions on anterior chest  Neurological:     General: No focal deficit present.     Mental Status: She is alert and oriented to person, place, and time.  Psychiatric:        Mood and Affect: Mood normal.        Behavior: Behavior normal.      Assessment And Plan:     1. Encounter for general adult medical examination w/o abnormal findings Comments: A full  exam was performed. Importance of monthly self breast exams was discussed with the patient. PATIENT IS ADVISED TO GET 30-45 MINUTES REGULAR EXERCISE NO LESS THAN FOUR TO FIVE DAYS PER WEEK - BOTH WEIGHTBEARING EXERCISES AND AEROBIC ARE RECOMMENDED.  PATIENT IS ADVISED TO FOLLOW  A HEALTHY DIET WITH AT LEAST SIX FRUITS/VEGGIES PER DAY, DECREASE INTAKE OF RED MEAT, AND TO INCREASE FISH INTAKE TO TWO DAYS PER WEEK.  MEATS/FISH SHOULD NOT BE FRIED, BAKED OR BROILED IS PREFERABLE.  IT IS ALSO IMPORTANT TO CUT BACK ON YOUR SUGAR INTAKE. PLEASE AVOID ANYTHING WITH ADDED SUGAR, CORN SYRUP OR OTHER SWEETENERS. IF YOU MUST USE A SWEETENER, YOU CAN TRY STEVIA. IT IS ALSO IMPORTANT TO AVOID ARTIFICIALLY SWEETENERS AND DIET BEVERAGES. LASTLY, I SUGGEST WEARING SPF 50 SUNSCREEN ON EXPOSED PARTS AND ESPECIALLY WHEN IN THE DIRECT SUNLIGHT FOR AN EXTENDED PERIOD OF TIME.  PLEASE AVOID FAST FOOD RESTAURANTS AND INCREASE YOUR WATER INTAKE.  2. Essential hypertension, benign Comments: Chronic, well controlled. She is encouraged to follow low sodium diet.  She will c/w amlodipine 11m daily. She will f/u in 6 months.   3. Pulmonary hypertension ([place] Comments: Chronic, Cardiology iput appreciated.  She will c/w Adempas 270mdaily.  - Microalbumin / Creatinine Urine Ratio - POCT Urinalysis Dipstick (81002) - CBC - CMP14+EGFR - Lipid panel  4. [place] Chronic, as per Rheumatology.  She is encouraged to follow an anti-inflammatory diet.   5. Callus Comments: I will refer her to podiatry for further evaluation. - Ambulatory referral to Podiatry  6. Frequent falls Comments: [facility]. She is in agreement wth her treatment plan. Vitamin B12 level is low end of normal. She may benefit from intramuscular B12 supplementation.  - Ambulatory referral to [facility]. BMI 23.0-23.9, adult Comments: She has lost 10 pounds since May 2023. I will check prealbumin level today.   8.  Immunization due - Pneumococcal conjugate vaccine 20-valent (Prevnar-20)  Patient was given opportunity to ask questions. Patient verbalized understanding of the plan and was able to repeat key elements of the plan. All questions were answered to their satisfaction.   I, RoMaximino GreenlandMD, have reviewed all documentation for this visit. The documentation on 11/21/21 for the exam, diagnosis, procedures, and orders are all accurate and complete.   THE PATIENT IS ENCOURAGED TO PRACTICE SOCIAL DISTANCING DUE TO THE COVID-19 PANDEMIC.

## 2021-11-22 LAB — LIPID PANEL
Chol/HDL Ratio: 2.8 ratio (ref 0.0–4.4)
Cholesterol, Total: 200 mg/dL — ABNORMAL HIGH (ref 100–199)
HDL: 71 mg/dL (ref >39)
LDL Chol Calc (NIH): 116 mg/dL — ABNORMAL HIGH (ref 0–99)
Triglycerides: 71 mg/dL (ref 0–149)
VLDL Cholesterol Cal: 13 mg/dL (ref 5–40)

## 2021-11-22 LAB — CBC
Hematocrit: 38.5 % (ref 34.0–46.6)
Hemoglobin: 12.5 g/dL (ref 11.1–15.9)
MCH: 30.3 pg (ref 26.6–33.0)
MCHC: 32.5 g/dL (ref 31.5–35.7)
MCV: 93 fL (ref 79–97)
Platelets: 316 10*3/uL (ref 150–450)
RBC: 4.12 x10E6/uL (ref 3.77–5.28)
RDW: 12.9 % (ref 11.7–15.4)
WBC: 3.3 10*3/uL — ABNORMAL LOW (ref 3.4–10.8)

## 2021-11-22 LAB — CMP14+EGFR
ALT: 18 IU/L (ref 0–32)
AST: 33 IU/L (ref 0–40)
Albumin/Globulin Ratio: 1.9 (ref 1.2–2.2)
Albumin: 4.7 g/dL (ref 3.8–4.8)
Alkaline Phosphatase: 71 IU/L (ref 44–121)
BUN/Creatinine Ratio: 20 (ref 12–28)
BUN: 14 mg/dL (ref 8–27)
Bilirubin Total: 0.6 mg/dL (ref 0.0–1.2)
CO2: 24 mmol/L (ref 20–29)
Calcium: 10.1 mg/dL (ref 8.7–10.3)
Chloride: 103 mmol/L (ref 96–106)
Creatinine, Ser: 0.69 mg/dL (ref 0.57–1.00)
Globulin, Total: 2.5 g/dL (ref 1.5–4.5)
Glucose: 86 mg/dL (ref 70–99)
Potassium: 4 mmol/L (ref 3.5–5.2)
Sodium: 143 mmol/L (ref 134–144)
Total Protein: 7.2 g/dL (ref 6.0–8.5)
eGFR: 88 mL/min/{1.73_m2} (ref >59)

## 2021-11-22 LAB — MICROALBUMIN / CREATININE URINE RATIO
Creatinine, Urine: 50.5 mg/dL
Microalb/Creat Ratio: 17 mg/g creat (ref 0–29)
Microalbumin, Urine: 8.5 ug/mL

## 2021-11-24 DIAGNOSIS — I10 Essential (primary) hypertension: Secondary | ICD-10-CM | POA: Insufficient documentation

## 2021-11-24 DIAGNOSIS — R296 Repeated falls: Secondary | ICD-10-CM | POA: Insufficient documentation

## 2021-11-24 DIAGNOSIS — L84 Corns and callosities: Secondary | ICD-10-CM | POA: Insufficient documentation

## 2021-11-27 LAB — PREALBUMIN: PREALBUMIN: 19 mg/dL (ref 9–32)

## 2021-11-27 LAB — SPECIMEN STATUS REPORT

## 2021-11-28 ENCOUNTER — Ambulatory Visit: Payer: Medicare HMO | Admitting: Cardiology

## 2021-11-29 ENCOUNTER — Encounter: Payer: Self-pay | Admitting: Cardiology

## 2021-11-29 ENCOUNTER — Ambulatory Visit: Payer: Medicare HMO | Admitting: Cardiology

## 2021-11-29 VITALS — BP 135/72 | HR 68 | Temp 98.2°F | Resp 96 | Ht 64.0 in | Wt 137.0 lb

## 2021-11-29 DIAGNOSIS — R0609 Other forms of dyspnea: Secondary | ICD-10-CM

## 2021-11-29 DIAGNOSIS — I272 Pulmonary hypertension, unspecified: Secondary | ICD-10-CM | POA: Diagnosis not present

## 2021-11-29 DIAGNOSIS — I10 Essential (primary) hypertension: Secondary | ICD-10-CM

## 2021-11-29 NOTE — Progress Notes (Signed)
Primary Physician/Referring:  Glendale Chard, MD  Patient ID: Nancy Lambert, female    DOB: 29-Dec-1940, 81 y.o.   MRN: [id number]  Chief Complaint  Patient presents with  . Hypertension  . Pulmonary Hypertention  . Follow-up    3 months   HPI:    Nancy Lambert  is a 81 y.o. 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 She is on Adempas for pulm HTN and has chronic dyspnea, lumbar radiculopathy.   Patient presents for a 6-week visit, doing well and is noticed blood pressure has remained stable since being on amlodipine.  No leg edema and no side effects.  Otherwise dyspnea is remained stable, she has noticed gait instability over the past 2 to 3 months.  No syncope.  No chest pain.  No palpitations. Past Medical History:  Diagnosis Date  . Anxiety   . Cardiac arrhythmia   . Glaucoma   . Hyperlipidemia   . Hypertension   . 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[place])   . Pulmonary hypertension ([place])   . Scleroderma ([place])   . Status post ablation of incompetent vein using laser bilateral leg 01/2019   Past Surgical History:  Procedure Laterality Date  . CARDIAC CATHETERIZATION N/A 07/24/2015   Procedure: Right Heart Cath;  Surgeon: Adrian Prows, MD;  Location: [facility];  Service: Cardiovascular;  Laterality: N/A;  . COLONOSCOPY    . steroid shots  2020   for pain  . VESICOVAGINAL FISTULA CLOSURE W/ TAH      Social History   Tobacco Use  . Smoking status: Former    Packs/day: 0.25    Years: 5.00    Total pack years: 1.25    Types: Cigarettes    Quit date: 1971    Years since quitting: 52.7  . Smokeless tobacco: Never  . Tobacco comments:    very light use when she did smoke  Substance Use Topics  . Alcohol use: Yes    Alcohol/week: 3.0 - 5.0 standard drinks of alcohol    Types: 3 - 5 Standard drinks or equivalent per week     Comment: occasional   Marital Status: Widowed   ROS  Review of Systems  Cardiovascular:  Positive for dyspnea on exertion. Negative for chest pain and leg swelling.   Objective  Blood pressure 135/72, pulse 68, temperature 98.2 F (36.8 C), temperature source Temporal, resp. rate 16, height '5\' 4"'  (1.626 m), weight 137 lb (62.1 kg), SpO2 96 %.     11/29/2021   10:50 AM 11/21/2021    9:53 AM 09/18/2021    2:50 PM  Vitals with BMI  Height '5\' 4"'  '5\' 4"'    Weight 137 lbs 136 lbs 3 oz   BMI 24.8 25.00   Systolic 370 488 891  Diastolic 72 76 63  Pulse 68 72 66    Orthostatic VS for the past 72 hrs (Last 3 readings):  Orthostatic BP Patient Position BP Location Cuff Size Orthostatic Pulse  11/29/21 1129 137/64 Standing Left Arm Normal 65  11/29/21 1128 134/59 Sitting Left Arm Normal 59  11/29/21 1127 134/59 Supine Left Arm Normal 57    Physical Exam Vitals reviewed.  Constitutional:      Appearance: She is well-developed.  Neck:     Vascular: No carotid bruit.  Cardiovascular:     Rate and Rhythm: Normal rate and regular rhythm.     Pulses:  Intact distal pulses.          Carotid pulses are 2+ on the right side and 2+ on the left side.      Radial pulses are 2+ on the right side and 2+ on the left side.       Dorsalis pedis pulses are 1+ on the right side and 1+ on the left side.       Posterior tibial pulses are 0 on the right side and 0 on the left side.     Heart sounds: Murmur heard.     Harsh midsystolic murmur is present with a grade of 2/6 at the upper right sternal border.     No gallop.     Comments: S1 normal and S2 loud.  Pulmonary:     Effort: Pulmonary effort is normal. No accessory muscle usage.     Breath sounds: Normal breath sounds.  Abdominal:     General: Bowel sounds are normal.     Palpations: Abdomen is soft.  Musculoskeletal:     Right lower leg: No edema.     Left lower leg: No edema.  Skin:    Comments: Abnormally dry due to scleroderma  Physical  exam unchanged compared to previous office visit.  Laboratory examination:   Lab Results  Component Value Date   NA 143 11/21/2021   K 4.0 11/21/2021   CO2 24 11/21/2021   GLUCOSE 86 11/21/2021   BUN 14 11/21/2021   CREATININE 0.69 11/21/2021   CALCIUM 10.1 11/21/2021   EGFR 88 11/21/2021   GFRNONAA 72 11/09/2019    estimated creatinine clearance is 49.2 mL/min (by C-G formula based on SCr of 0.69 mg/dL).     Latest Ref Rng & Units 11/21/2021   11:00 AM 06/12/2021   10:27 AM 11/15/2020    3:56 PM  CMP  Glucose 70 - 99 mg/dL 86  85  87   BUN 8 - 27 mg/dL '14  12  14   ' Creatinine 0.57 - 1.00 mg/dL 0.69  0.70  0.67   Sodium 134 - 144 mmol/L 143  144  143   Potassium 3.5 - 5.2 mmol/L 4.0  3.8  3.7   Chloride 96 - 106 mmol/L 103  102  102   CO2 20 - 29 mmol/L '24  28  27   ' Calcium 8.7 - 10.3 mg/dL 10.1  9.7  9.9   Total Protein 6.0 - 8.5 g/dL 7.2  6.9  7.1   Total Bilirubin 0.0 - 1.2 mg/dL 0.6  0.4  0.3   Alkaline Phos 44 - 121 IU/L 71  81  101   AST 0 - 40 IU/L 33  18  28   ALT 0 - 32 IU/L '18  10  13       ' Latest Ref Rng & Units 11/21/2021   11:00 AM 06/12/2021   10:27 AM 11/15/2020    3:56 PM  CBC  WBC 3.4 - 10.8 x10E3/uL 3.3  4.2  4.1   Hemoglobin 11.1 - 15.9 g/dL 12.5  12.1  12.6   Hematocrit 34.0 - 46.6 % 38.5  37.2  39.9   Platelets 150 - 450 x10E3/uL 316  516  306     Lipid Panel    Component Value Date/Time   CHOL 200 (H) 11/21/2021 1100   TRIG 71 11/21/2021 1100   HDL 71 11/21/2021 1100   CHOLHDL 2.8 11/21/2021 1100   LDLCALC 116 (H) 11/21/2021 1100    HEMOGLOBIN A1C No  results found for: "HGBA1C", "MPG" TSH Recent Labs    08/06/21 1642  TSH 3.060    Allergies  Not on File   Final Medications at End of Visit     Current Outpatient Medications:  .  amLODipine (NORVASC) 10 MG tablet, Take 1 tablet (10 mg total) by mouth daily., Disp: 30 tablet, Rfl: 3 .  aspirin EC 81 MG tablet, Take 81 mg by mouth daily., Disp: , Rfl:  .  omeprazole (PRILOSEC)  20 MG capsule, Take 1 capsule (20 mg total) by mouth daily., Disp: 90 capsule, Rfl: 2 .  Riociguat (ADEMPAS) 2 MG TABS, Take 1 tablet by mouth 3 (three) times daily., Disp: 252 tablet, Rfl: 0  Radiology:   No results found.  Cardiac Studies:   Lexiscan myoview stress test 06/18/2015: 1. Resting EKG demonstrates normal sinus rhythm, left axis deviation, cannot exclude inferior infarct old, Anterior infarct old.  Stress EKG was negative for myocardial ischemia.  Patient exercised for 4 minutes and 5 seconds and achieved 5.93 Mets.  There are frequent PVCs in the recovery phase of the stress test.  Stress terminated due to achieving target heart rate, 86% of MPHR.  Stress symptoms included dyspnea. 2. Myocardial perfusion imaging is normal. Overall left ventricular systolic function was normal without regional wall motion abnormalities. The left ventricular ejection fraction was 62%.  Right heart cath 07/24/2015: Right Heart Pressure:  RA A Wave  13 mmHg,   RA V Wave  9 mmHg,   RA Mean  8 mmHg  RV Systolic Pressure  48 mmHg,   RV Diastolic Pressure  4 mmHg, RV EDP  11 mmHg  PA Systolic Pressure  48 mmHg,   PA Diastolic Pressure  14 mmHg,   PA Mean  29 mmHg. PW A Wave  19 mmHg,   PW V Wave  17 mmHg,   PW Mean  15 mmHg  QP/QS  1    Total pulmonary vascular resistance 7 Wood units.   Impression: Mild to moderate pulmonary hypertension. Wedge pressure although normal, in the upper end of spectrum suggesting the pulmonary hypertension could also be related to mild LV diastolic heart failure. Findings probably more consistent with primary pulmonary hypertension. Clinical correlation recommended.   Lower Venous DVT Study 07/14/2019: BILATERAL:  - No evidence of deep vein thrombosis seen in the lower extremities, bilaterally.   PCV ECHOCARDIOGRAM COMPLETE 71/24/5809 Normal LV systolic function with EF 63%. Moderate concentric hypertrophy of the left ventricle. Normal global wall motion. Left  ventricle cavity is normal in size. Indeterminate diastolic filling pattern. Left atrial cavity is moderately dilated. Right atrial cavity is moderately dilated. Mild to moderate mitral regurgitation. Mild to moderate tricuspid regurgitation. Estimated pulmonary artery systolic pressure 29 mmHg. Mild pulmonic regurgitation. Moderate pericardial effusion. There is no hemodynamic significance. No significant change compared to previous study on 01/14/2021.  EKG   08/21/2021: Sinus rhythm with rate variation and a rate of 53 bpm.  Left axis, left anterior fascicular block.  Right atrial enlargement.  Poor R wave progression, cannot exclude anteroseptal infarct old.  LVH with secondary repolarization abnormality.  Unchanged compared to EKG 12/11/2020.  Assessment     ICD-10-CM   1. Primary hypertension  I10     2. Pulmonary hypertension (HCC)  I27.20     3. Dyspnea on exertion  R06.09       No orders of the defined types were placed in this encounter.  Medications Discontinued During This Encounter  Medication Reason  .  0.9 %  sodium chloride infusion    Recommendations:   Nancy Lambert  is a 81 y.o. 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 She is on Adempas for pulm HTN and has chronic dyspnea, lumbar radiculopathy.  Over the past 2 to 3 months she has noticed gait instability.  Otherwise states that her blood pressure has been well controlled since adding amlodipine.  She is not orthostatic today.  Dyspnea on exertion has remained stable, I reviewed her echocardiogram, PA pressures have essentially normalized.  No change in her physical exam.  I would like to see her back in 6 months and I will repeat 6-minute walk test at that time along with repeating her echocardiogram.  As she has remained stable with regard to [facility] therapy, I have not added second or third  agent.  Fortunately she has responded well to riociguat.   Adrian Prows, MD, [facility] 11/29/2021, 10:57 AM Office: [phone] Fax: [phone] Pager: [phone]

## 2021-12-11 NOTE — Telephone Encounter (Signed)
Its fine to try Omeprazole '20mg'$  daily. Thanks

## 2021-12-16 ENCOUNTER — Encounter: Payer: Self-pay | Admitting: Podiatry

## 2021-12-16 ENCOUNTER — Ambulatory Visit: Payer: Medicare HMO | Admitting: Podiatry

## 2021-12-16 ENCOUNTER — Other Ambulatory Visit (INDEPENDENT_AMBULATORY_CARE_PROVIDER_SITE_OTHER): Payer: Medicare HMO | Admitting: Internal Medicine

## 2021-12-16 DIAGNOSIS — I272 Pulmonary hypertension, unspecified: Secondary | ICD-10-CM

## 2021-12-16 DIAGNOSIS — M349 Systemic sclerosis, unspecified: Secondary | ICD-10-CM | POA: Diagnosis not present

## 2021-12-16 DIAGNOSIS — R002 Palpitations: Secondary | ICD-10-CM | POA: Diagnosis not present

## 2021-12-16 DIAGNOSIS — E7800 Pure hypercholesterolemia, unspecified: Secondary | ICD-10-CM | POA: Diagnosis not present

## 2021-12-16 DIAGNOSIS — R296 Repeated falls: Secondary | ICD-10-CM

## 2021-12-16 DIAGNOSIS — L84 Corns and callosities: Secondary | ICD-10-CM

## 2021-12-16 DIAGNOSIS — R0609 Other forms of dyspnea: Secondary | ICD-10-CM | POA: Diagnosis not present

## 2021-12-16 DIAGNOSIS — Z7982 Long term (current) use of aspirin: Secondary | ICD-10-CM | POA: Insufficient documentation

## 2021-12-16 DIAGNOSIS — I10 Essential (primary) hypertension: Secondary | ICD-10-CM | POA: Diagnosis not present

## 2021-12-16 NOTE — Progress Notes (Signed)
  Subjective:  Patient ID: Nancy Lambert, female    DOB: Aug 18, 1940,   MRN: [id number]  Chief Complaint  Patient presents with   Callouses    Callouses on bottom of bilateral feet     81 y.o. female presents for concern of thickened and painful calluses Skin. No lacerations or abrasions bilateral feet. Hyperkeratotic lesions noted sub second and fifth metatarsals and well has medial hallux and sub third digits bilateral.  Musculoskeletal: MMT 5/5 bilateral lower extremities in DF, PF, Inversion and Eversion. Deceased ROM in DF of ankle joint.  Neurological: Sensation intact to light touch.   Assessment:   1. Callus      Plan:  Patient was evaluated and treated and all questions answered. -Discussed corns and calluses with patient and treatment options.  -Hyperkeratotic tissue was debrided with chisel without incident.  -Encouraged daily moisturizing -Discussed use of pumice stone -Advised good supportive shoes and inserts -Patient to return to office as needed or sooner if condition worsens.   Lorenda Peck, DPM

## 2021-12-16 NOTE — Progress Notes (Signed)
CC: home health certification HPI   Received home health orders orders from [place]. Start of care 11/30/21.   Certification and orders from 11/30/21 through 01/28/22 are reviewed, signed and faxed back to home health company.  Need of intermittent skilled services at home: PT  The home health care plan has been established by me and will be reviewed and updated as needed to maximize patient recovery.  I certify that all home health services have been and will be furnished to the patient while under my care.  Face-to-face encounter in which the need for home health services was established: 11/21/21  Patient is receiving home health services for the following diagnoses: Problem List Items Addressed This Visit       Cardiovascular and Mediastinum   Pulmonary hypertension ([place]) - Primary   Essential hypertension, benign     Musculoskeletal and Integument   Callus     Other   Dyspnea on exertion   Scleroderma ([place])   Palpitations   Frequent falls   Pure hypercholesterolemia   Encounter for long-term (current) aspirin use     Maximino Greenland, MD

## 2021-12-25 ENCOUNTER — Telehealth: Payer: Self-pay | Admitting: *Deleted

## 2021-12-25 ENCOUNTER — Ambulatory Visit (INDEPENDENT_AMBULATORY_CARE_PROVIDER_SITE_OTHER): Payer: Medicare HMO

## 2021-12-25 VITALS — Ht 64.0 in | Wt 137.0 lb

## 2021-12-25 DIAGNOSIS — Z0000 Encounter for general adult medical examination without abnormal findings: Secondary | ICD-10-CM

## 2021-12-25 NOTE — Telephone Encounter (Signed)
   Telephone encounter was:  Successful.  12/25/2021 Name: Aubriauna Riner MRN: [id number] DOB: 08/25/1940  Oasis Goehring Sleep is a 81 y.o. year old female who is a primary care patient of Glendale Chard, MD . The community resource team was consulted for assistance with Food Insecurity patient has switched to another insurance plan and is afraid she has been scammed so I provided numbers to Medicaid , [employer] , and senior legal aide , will mail food bank lists and going to get a few months days Care guide performed the following interventions: Patient provided with information about care guide support team and interviewed to confirm resource needs.  Follow Up Plan:  Care guide will follow up with patient by phone over the next day  [place] [phone] [address] , [place] [address] Email : [email or web address]

## 2021-12-25 NOTE — Patient Instructions (Signed)
Nancy Lambert , Thank you for taking time to come for your Medicare Wellness Visit. I appreciate your ongoing commitment to your health goals. Please review the following plan we discussed and let me know if I can assist you in the future.   Screening recommendations/referrals: Colonoscopy: not required Mammogram: not required Bone Density: completed 08/31/2019 Recommended yearly ophthalmology/optometry visit for glaucoma screening and checkup Recommended yearly dental visit for hygiene and checkup  Vaccinations: Influenza vaccine: due Pneumococcal vaccine: completed 11/21/2021 Tdap vaccine: due Shingles vaccine: discussed   Covid-19: 01/21/2021, 01/21/2020, 07/04/2019, 06/12/2019  Advanced directives: Advance directive discussed with you today.    Conditions/risks identified: none  Next appointment: Follow up in one year for your annual wellness visit    Preventive Care 65 Years and Older, Female Preventive care refers to lifestyle choices and visits with your health care provider that can promote health and wellness. What does preventive care include? A yearly physical exam. This is also called an annual well check. Dental exams once or twice a year. Routine eye exams. Ask your health care provider how often you should have your eyes checked. Personal lifestyle choices, including: Daily care of your teeth and gums. Regular physical activity. Eating a healthy diet. Avoiding tobacco and drug use. Limiting alcohol use. Practicing safe sex. Taking low-dose aspirin every day. Taking vitamin and mineral supplements as recommended by your health care provider. What happens during an annual well check? The services and screenings done by your health care provider during your annual well check will depend on your age, overall health, lifestyle risk factors, and family history of disease. Counseling  Your health care provider may ask you questions about your: Alcohol use. Tobacco  use. Drug use. Emotional well-being. Home and relationship well-being. Sexual activity. Eating habits. History of falls. Memory and ability to understand (cognition). Work and work Statistician. Reproductive health. Screening  You may have the following tests or measurements: Height, weight, and BMI. Blood pressure. Lipid and cholesterol levels. These may be checked every 5 years, or more frequently if you are over 68 years old. Skin check. Lung cancer screening. You may have this screening every year starting at age 80 if you have a 30-pack-year history of smoking and currently smoke or have quit within the past 15 years. Fecal occult blood test (FOBT) of the stool. You may have this test every year starting at age 110. Flexible sigmoidoscopy or colonoscopy. You may have a sigmoidoscopy every 5 years or a colonoscopy every 10 years starting at age 40. Hepatitis C blood test. Hepatitis B blood test. Sexually transmitted disease (STD) testing. Diabetes screening. This is done by checking your blood sugar (glucose) after you have not eaten for a while (fasting). You may have this done every 1-3 years. Bone density scan. This is done to screen for osteoporosis. You may have this done starting at age 61. Mammogram. This may be done every 1-2 years. Talk to your health care provider about how often you should have regular mammograms. Talk with your health care provider about your test results, treatment options, and if necessary, the need for more tests. Vaccines  Your health care provider may recommend certain vaccines, such as: Influenza vaccine. This is recommended every year. Tetanus, diphtheria, and acellular pertussis (Tdap, Td) vaccine. You may need a Td booster every 10 years. Zoster vaccine. You may need this after age 55. Pneumococcal 13-valent conjugate (PCV13) vaccine. One dose is recommended after age 8. Pneumococcal polysaccharide (PPSV23) vaccine. One dose is recommended  after age 80. Talk to your health care provider about which screenings and vaccines you need and how often you need them. This information is not intended to replace advice given to you by your health care provider. Make sure you discuss any questions you have with your health care provider. Document Released: 04/13/2015 Document Revised: 12/05/2015 Document Reviewed: 01/16/2015 Elsevier Interactive Patient Education  2017 [employer] Prevention in the Home Falls can cause injuries. They can happen to people of all ages. There are many things you can do to make your home safe and to help prevent falls. What can I do on the outside of my home? Regularly fix the edges of walkways and driveways and fix any cracks. Remove anything that might make you trip as you walk through a door, such as a raised step or threshold. Trim any bushes or trees on the path to your home. Use bright outdoor lighting. Clear any walking paths of anything that might make someone trip, such as rocks or tools. Regularly check to see if handrails are loose or broken. Make sure that both sides of any steps have handrails. Any raised decks and porches should have guardrails on the edges. Have any leaves, snow, or ice cleared regularly. Use sand or salt on walking paths during winter. Clean up any spills in your garage right away. This includes oil or grease spills. What can I do in the bathroom? Use night lights. Install grab bars by the toilet and in the tub and shower. Do not use towel bars as grab bars. Use non-skid mats or decals in the tub or shower. If you need to sit down in the shower, use a plastic, non-slip stool. Keep the floor dry. Clean up any water that spills on the floor as soon as it happens. Remove soap buildup in the tub or shower regularly. Attach bath mats securely with double-sided non-slip rug tape. Do not have throw rugs and other things on the floor that can make you trip. What can I do  in the bedroom? Use night lights. Make sure that you have a light by your bed that is easy to reach. Do not use any sheets or blankets that are too big for your bed. They should not hang down onto the floor. Have a firm chair that has side arms. You can use this for support while you get dressed. Do not have throw rugs and other things on the floor that can make you trip. What can I do in the kitchen? Clean up any spills right away. Avoid walking on wet floors. Keep items that you use a lot in easy-to-reach places. If you need to reach something above you, use a strong step stool that has a grab bar. Keep electrical cords out of the way. Do not use floor polish or wax that makes floors slippery. If you must use wax, use non-skid floor wax. Do not have throw rugs and other things on the floor that can make you trip. What can I do with my stairs? Do not leave any items on the stairs. Make sure that there are handrails on both sides of the stairs and use them. Fix handrails that are broken or loose. Make sure that handrails are as long as the stairways. Check any carpeting to make sure that it is firmly attached to the stairs. Fix any carpet that is loose or worn. Avoid having throw rugs at the top or bottom of the stairs. If you do  have throw rugs, attach them to the floor with carpet tape. Make sure that you have a light switch at the top of the stairs and the bottom of the stairs. If you do not have them, ask someone to add them for you. What else can I do to help prevent falls? Wear shoes that: Do not have high heels. Have rubber bottoms. Are comfortable and fit you well. Are closed at the toe. Do not wear sandals. If you use a stepladder: Make sure that it is fully opened. Do not climb a closed stepladder. Make sure that both sides of the stepladder are locked into place. Ask someone to hold it for you, if possible. Clearly mark and make sure that you can see: Any grab bars or  handrails. First and last steps. Where the edge of each step is. Use tools that help you move around (mobility aids) if they are needed. These include: Canes. Walkers. Scooters. Crutches. Turn on the lights when you go into a dark area. Replace any light bulbs as soon as they burn out. Set up your furniture so you have a clear path. Avoid moving your furniture around. If any of your floors are uneven, fix them. If there are any pets around you, be aware of where they are. Review your medicines with your doctor. Some medicines can make you feel dizzy. This can increase your chance of falling. Ask your doctor what other things that you can do to help prevent falls. This information is not intended to replace advice given to you by your health care provider. Make sure you discuss any questions you have with your health care provider. Document Released: 01/11/2009 Document Revised: 08/23/2015 Document Reviewed: 04/21/2014 Elsevier Interactive Patient Education  2017 [employer].

## 2021-12-25 NOTE — Progress Notes (Signed)
I connected with Nancy Lambert today by telephone and verified that I am speaking with the correct person using two identifiers. Location patient: home Location provider: work Persons participating in the virtual visit: Nancy Lambert, Glenna Durand LPN.   I discussed the limitations, risks, security and privacy concerns of performing an evaluation and management service by telephone and the availability of in person appointments. I also discussed with the patient that there may be a patient responsible charge related to this service. The patient expressed understanding and verbally consented to this telephonic visit.    Interactive audio and video telecommunications were attempted between this provider and patient, however failed, due to patient having technical difficulties OR patient did not have access to video capability.  We continued and completed visit with audio only.     Vital signs may be patient reported or missing.  Subjective:   Nancy Lambert is a 81 y.o. female who presents for Medicare Annual (Subsequent) preventive examination.  Review of Systems     Cardiac Risk Factors include: advanced age (>39mn, >>6women);hypertension     Objective:    Today's Vitals   12/25/21 1227 12/25/21 1228  Weight: 137 lb (62.1 kg)   Height: '5\' 4"'$  (1.626 m)   PainSc:  6    Body mass index is 23.52 kg/m.     12/25/2021   12:40 PM 07/23/2021    9:47 PM 12/13/2020   10:10 AM 11/09/2019    2:23 PM 03/30/2019    4:34 PM 09/07/2018   11:14 AM 02/13/2017   12:52 PM  Advanced Directives  Does Patient Have a Medical Advance Directive? No No No No No No No  Would patient like information on creating a medical advance directive?    No - Patient declined No - Patient declined No - Patient declined     Current Medications (verified) Outpatient Encounter Medications as of 12/25/2021  Medication Sig   amLODipine (NORVASC) 10 MG tablet Take 1 tablet (10 mg total) by mouth daily.    aspirin EC 81 MG tablet Take 81 mg by mouth daily.   omeprazole (PRILOSEC) 20 MG capsule Take 1 capsule (20 mg total) by mouth daily.   Riociguat (ADEMPAS) 2 MG TABS Take 1 tablet by mouth 3 (three) times daily.   No facility-administered encounter medications on file as of 12/25/2021.    Allergies (verified) Patient has no known allergies.   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 01/2019   Past Surgical History:  Procedure Laterality Date   CARDIAC CATHETERIZATION N/A 07/24/2015   Procedure: Right Heart Cath;  Surgeon: JAdrian Prows MD;  [facility];  Service: Cardiovascular;  Laterality: N/A;   COLONOSCOPY     steroid shots  2020   for pain   VESICOVAGINAL FISTULA CLOSURE W/ TAH     Family History  Problem Relation Age of Onset   Breast cancer Mother    Clotting disorder Mother    Diabetes Mother    Breast cancer Sister    Clotting disorder Sister    Prostate cancer Son    Breast cancer Daughter    Other Father        drowned  Breast cancer Sister    Breast cancer Other    Colon cancer Neg Hx    Colon polyps Neg Hx    Esophageal cancer Neg Hx    Rectal cancer Neg Hx    Stomach cancer Neg Hx    Social History   Socioeconomic History   Marital status: Widowed    Spouse name: Not on file   Number of children: 4   Years of education: Not on file   Highest education level: Not on file  Occupational History   Occupation: Retired  Tobacco Use   Smoking status: Former    Packs/day: 0.25    Years: 5.00    Total pack years: 1.25    Types: Cigarettes    Quit date: 1971    Years since quitting: 52.7   Smokeless tobacco: Never   Tobacco comments:    very light use when she did smoke  Vaping Use    Vaping Use: Never used  Substance and Sexual Activity   Alcohol use: Yes    Alcohol/week: 3.0 - 5.0 standard drinks of alcohol    Types: 3 - 5 Standard drinks or equivalent per week    Comment: occasional   Drug use: No   Sexual activity: Not Currently  Other Topics Concern   Not on file  Social History Narrative   Lives at home with her daughter   Right handed   Social Determinants of Health   Financial Resource Strain: Medium Risk (12/25/2021)   Overall Financial Resource Strain (CARDIA)    Difficulty of Paying Living Expenses: Somewhat hard  Food Insecurity: Food Insecurity Present (12/25/2021)   Hunger Vital Sign    Worried About Running Out of Food in the Last Year: Often true    Ran Out of Food in the Last Year: Often true  Transportation Needs: No Transportation Needs (12/25/2021)   PRAPARE - Hydrologist (Medical): No    Lack of Transportation (Non-Medical): No  Physical Activity: Insufficiently Active (12/25/2021)   Exercise Vital Sign    Days of Exercise per Week: 5 days    Minutes of Exercise per Session: 20 min  Stress: Stress Concern Present (12/25/2021)   [place]    Feeling of Stress : To some extent  Social Connections: Not on file    Tobacco Counseling Counseling given: Not Answered Tobacco comments: very light use when she did smoke   Clinical Intake:  Pre-visit preparation completed: Yes  Pain : 0-10 Pain Score: 6  Pain Type: Acute pain Pain Location: Neck Pain Descriptors / Indicators: Aching Pain Onset: More than a month ago Pain Frequency: Intermittent     Nutritional Status: BMI of 19-24  Normal Nutritional Risks: Nausea/ vomitting/ diarrhea (diarrhea due to food) Diabetes: No  How often do you need to have someone help you when you read instructions, pamphlets, or other written materials from your doctor or pharmacy?: 1 -  Never  Diabetic?no  Interpreter Needed?: No  Information entered by :: NAllen LPN   Activities of Daily Living    12/25/2021   12:49 PM  In your present state of health, do you have any difficulty performing the following activities:  Hearing? 0  Vision? 1  Comment eyes feel tired  Difficulty concentrating or making decisions? 0  Walking or climbing stairs? 0  Dressing or bathing? 0  Doing errands, shopping? 0  Preparing Food and eating ? N  Using the  Toilet? N  In the past six months, have you accidently leaked urine? N  Do you have problems with loss of bowel control? N  Managing your Medications? N  Managing your Finances? N  Housekeeping or managing your Housekeeping? N    Patient Care Team: Glendale Chard, MD as PCP - General (Internal Medicine)  Indicate any recent Medical Services you may have received from other than [facility] providers in the past year (date may be approximate).     Assessment:   This is a routine wellness examination for Nancy Lambert.  Hearing/Vision screen Vision Screening - Comments:: Regular eye exams, [facility]  Dietary issues and exercise activities discussed: Current Exercise Habits: The patient does not participate in regular exercise at present   Goals Addressed             This Visit's Progress    Patient Stated       12/25/2021, wants to get stronger       Depression Screen    12/25/2021   12:44 PM 08/06/2021    3:58 PM 08/06/2021    3:56 PM 12/13/2020   10:14 AM 11/15/2020    2:49 PM 11/09/2019    2:26 PM 12/22/2018    3:48 PM  PHQ 2/9 Scores  PHQ - 2 Score 5 0 0 0 1 0 0  PHQ- 9 Score 6    1      Fall Risk    12/25/2021   12:42 PM 11/24/2021   10:46 PM 12/13/2020   10:13 AM 11/15/2020    2:49 PM 11/09/2019    2:24 PM  Fall Risk   Falls in the past year? '1 1 1 1 1  '$ Comment has callouses on feet that hurt trouble walking  tripped over side walk  fell off chair  Number falls in past yr: 1 1 0 0 0  Injury with Fall? 0 0  0 1 0  Risk for fall due to : Medication side effect;History of fall(s) History of fall(s);Impaired balance/gait Medication side effect  Medication side effect  Follow up Falls evaluation completed;Education provided;Falls prevention discussed Falls evaluation completed;Falls prevention discussed Falls evaluation completed;Education provided;Falls prevention discussed  Falls evaluation completed;Education provided;Falls prevention discussed  Comment  refer to [place]:  Any stairs in or around the home? Yes  If so, are there any without handrails? No  Home free of loose throw rugs in walkways, pet beds, electrical cords, etc? Yes  Adequate lighting in your home to reduce risk of falls? Yes   ASSISTIVE DEVICES UTILIZED TO PREVENT FALLS:  Life alert? No  Use of a cane, walker or w/c? No  Grab bars in the bathroom? No  Shower chair or bench in shower? No  Elevated toilet seat or a handicapped toilet? No   TIMED UP AND GO:  Was the test performed? No .      Cognitive Function:        12/25/2021   12:56 PM 12/13/2020   10:17 AM 11/09/2019    2:27 PM 09/07/2018   11:20 AM  6CIT Screen  What Year? 0 points 0 points 0 points 0 points  What month? 0 points 0 points 0 points 0 points  What time? 0 points 0 points 0 points 0 points  Count back from 20 0 points 0 points 2 points 0 points  Months in reverse 4 points 4 points 4 points 4 points  Repeat phrase 2 points 0 points 2 points 0 points  Total Score 6 points 4 points 8 points 4 points    Immunizations Immunization History  Administered Date(s) Administered   Influenza Split 12/30/2014   Influenza, High Dose Seasonal PF 01/28/2018   Influenza,inj,Quad PF,6+ Mos 01/30/2016   Influenza-Unspecified 01/29/2013   PFIZER(Purple Top)SARS-COV-2 Vaccination 06/12/2019, 07/04/2019, 01/21/2020, 01/21/2021   PNEUMOCOCCAL CONJUGATE-20 11/21/2021   Pneumococcal Conjugate-13 05/09/2015     TDAP status: Due, Education has been provided regarding the importance of this vaccine. Advised may receive this vaccine at local pharmacy or Health Dept. Aware to provide a copy of the vaccination record if obtained from local pharmacy or Health Dept. Verbalized acceptance and understanding.  Flu Vaccine status: Declined, Education has been provided regarding the importance of this vaccine but patient still declined. Advised may receive this vaccine at local pharmacy or Health Dept. Aware to provide a copy of the vaccination record if obtained from local pharmacy or Health Dept. Verbalized acceptance and understanding.  Pneumococcal vaccine status: Up to date  Covid-19 vaccine status: Completed vaccines  Qualifies for Shingles Vaccine? Yes   Zostavax completed No   Shingrix Completed?: No.    Education has been provided regarding the importance of this vaccine. Patient has been advised to call insurance company to determine out of pocket expense if they have not yet received this vaccine. Advised may also receive vaccine at local pharmacy or Health Dept. Verbalized acceptance and understanding.  Screening Tests Health Maintenance  Topic Date Due   COVID-19 Vaccine (5 - Pfizer risk series) 03/18/2021   INFLUENZA VACCINE  10/29/2021   Zoster Vaccines- Shingrix (1 of 2) 02/21/2022 (Originally 11/19/1959)   TETANUS/TDAP  08/07/2022 (Originally 05/28/2021)   COLONOSCOPY (Pts 45-41yr Insurance coverage will need to be confirmed)  08/15/2023   Pneumonia Vaccine 81 Years old  Completed   DEXA SCAN  Completed   Hepatitis C Screening  Completed   HPV VACCINES  Aged Out    Health Maintenance  Health Maintenance Due  Topic Date Due   COVID-19 Vaccine (5 - Pfizer risk series) 03/18/2021   INFLUENZA VACCINE  10/29/2021    Colorectal cancer screening: No longer required.   Mammogram status: No longer required due to age.  Bone Density status: Completed 08/31/2019.  Lung Cancer  Screening: (Low Dose CT Chest recommended if Age 79-80years, 30 pack-year currently smoking OR have quit w/in 15years.) does not qualify.   Lung Cancer Screening Referral: no  Additional Screening:  Hepatitis C Screening: does qualify; Completed 05/03/2013  Vision Screening: Recommended annual ophthalmology exams for early detection of glaucoma and other disorders of the eye. Is the patient up to date with their annual eye exam?  Yes  Who is the provider or what is the name of the office in which the patient attends annual eye exams? Dr. GKaty FitchIf pt is not established with a provider, would they like to be referred to a provider to establish care? No .   Dental Screening: Recommended annual dental exams for proper oral hygiene  Community Resource Referral / Chronic Care Management: CRR required this visit?  No   CCM required this visit?  No      Plan:     I have personally reviewed and noted the following in the patient's chart:   Medical and social history Use of alcohol, tobacco or illicit drugs  Current medications and supplements including opioid prescriptions. Patient is not currently taking opioid prescriptions. Functional ability and status  Nutritional status Physical activity Advanced directives List of other physicians Hospitalizations, surgeries, and ER visits in previous 12 months Vitals Screenings to include cognitive, depression, and falls Referrals and appointments  In addition, I have reviewed and discussed with patient certain preventive protocols, quality metrics, and best practice recommendations. A written personalized care plan for preventive services as well as general preventive health recommendations were provided to patient.     Kellie Simmering, LPN   [id number]   Nurse Notes: none  Due to this being a virtual visit, the after visit summary with patients personalized plan was offered to patient via mail or my-chart. Patient would like to access on  my-chart

## 2021-12-26 ENCOUNTER — Telehealth: Payer: Self-pay | Admitting: *Deleted

## 2021-12-26 NOTE — Telephone Encounter (Signed)
   Telephone encounter was:  Successful.  12/26/2021 Name: Hailey Stormer MRN: [id number] DOB: 1941/01/21  Liya Strollo Najera is a 81 y.o. year old female who is a primary care patient of Glendale Chard, MD . The community resource team was consulted for assistance with Transportation Needs  and insurance   Care guide performed the following interventions: Patient provided with information about care guide support team and interviewed to confirm resource needs. patient called to let me know she has resolved the [place] health care mix up and is going to get her food card back , will see if she qualifies for shortterm moms meals  Follow Up Plan:  No further follow up planned at this time. The patient has been provided with needed resources.  [place] [phone] [address] , [place] [address] Email : [email or web address]

## 2021-12-31 ENCOUNTER — Other Ambulatory Visit: Payer: Self-pay | Admitting: Gastroenterology

## 2021-12-31 NOTE — Telephone Encounter (Signed)
Patient called states her Omeprazole 20 mg is not working for her, states the 40 mg is better for her, and would like a refill on the 40 mg. Please call to advise.

## 2022-01-01 MED ORDER — OMEPRAZOLE 40 MG PO CPDR: 40.0000 mg | DELAYED_RELEASE_CAPSULE | Freq: Every day | ORAL | 3 refills | Status: DC

## 2022-01-01 NOTE — Telephone Encounter (Signed)
Yes its fine to send RX '40mg'$  daily X 30 days with 3 refills. Please schedule next available office visit with me or APP

## 2022-01-01 NOTE — Telephone Encounter (Signed)
Returned call to patient. She would like RX for Protonix sent to [facility] on file. Pt has been scheduled for a follow up appointment with Nicoletta Ba, PA-C on Monday, 02/03/22 at 10:30 am. Pt verbalized understanding and had no concerns at the end of the call.

## 2022-01-02 ENCOUNTER — Ambulatory Visit (INDEPENDENT_AMBULATORY_CARE_PROVIDER_SITE_OTHER): Payer: Medicare Other | Admitting: Internal Medicine

## 2022-01-02 ENCOUNTER — Ambulatory Visit: Payer: Medicare Other

## 2022-01-02 ENCOUNTER — Encounter: Payer: Self-pay | Admitting: Internal Medicine

## 2022-01-02 ENCOUNTER — Telehealth: Payer: Self-pay | Admitting: Gastroenterology

## 2022-01-02 VITALS — BP 122/80 | HR 60 | Temp 98.0°F | Ht 64.0 in | Wt 139.6 lb

## 2022-01-02 DIAGNOSIS — Z2821 Immunization not carried out because of patient refusal: Secondary | ICD-10-CM | POA: Diagnosis not present

## 2022-01-02 DIAGNOSIS — I10 Essential (primary) hypertension: Secondary | ICD-10-CM

## 2022-01-02 DIAGNOSIS — I272 Pulmonary hypertension, unspecified: Secondary | ICD-10-CM

## 2022-01-02 DIAGNOSIS — L989 Disorder of the skin and subcutaneous tissue, unspecified: Secondary | ICD-10-CM | POA: Diagnosis not present

## 2022-01-02 DIAGNOSIS — Z5941 Food insecurity: Secondary | ICD-10-CM | POA: Diagnosis not present

## 2022-01-02 DIAGNOSIS — Z6823 Body mass index (BMI) 23.0-23.9, adult: Secondary | ICD-10-CM | POA: Diagnosis not present

## 2022-01-02 MED ORDER — OMEPRAZOLE 40 MG PO CPDR: 40.0000 mg | DELAYED_RELEASE_CAPSULE | Freq: Every day | ORAL | 1 refills | Status: DC

## 2022-01-02 NOTE — Patient Instructions (Signed)
Hypertension, Adult ?Hypertension is another name for high blood pressure. High blood pressure forces your heart to work harder to pump blood. This can cause problems over time. ?There are two numbers in a blood pressure reading. There is a top number (systolic) over a bottom number (diastolic). It is best to have a blood pressure that is below 120/80. ?What are the causes? ?The cause of this condition is not known. Some other conditions can lead to high blood pressure. ?What increases the risk? ?Some lifestyle factors can make you more likely to develop high blood pressure: ?Smoking. ?Not getting enough exercise or physical activity. ?Being overweight. ?Having too much fat, sugar, calories, or salt (sodium) in your diet. ?Drinking too much alcohol. ?Other risk factors include: ?Having any of these conditions: ?Heart disease. ?Diabetes. ?High cholesterol. ?Kidney disease. ?Obstructive sleep apnea. ?Having a family history of high blood pressure and high cholesterol. ?Age. The risk increases with age. ?Stress. ?What are the signs or symptoms? ?High blood pressure may not cause symptoms. Very high blood pressure (hypertensive crisis) may cause: ?Headache. ?Fast or uneven heartbeats (palpitations). ?Shortness of breath. ?Nosebleed. ?Vomiting or feeling like you may vomit (nauseous). ?Changes in how you see. ?Very bad chest pain. ?Feeling dizzy. ?Seizures. ?How is this treated? ?This condition is treated by making healthy lifestyle changes, such as: ?Eating healthy foods. ?Exercising more. ?Drinking less alcohol. ?Your doctor may prescribe medicine if lifestyle changes do not help enough and if: ?Your top number is above 130. ?Your bottom number is above 80. ?Your personal target blood pressure may vary. ?Follow these instructions at home: ?Eating and drinking ? ?If told, follow the DASH eating plan. To follow this plan: ?Fill one half of your plate at each meal with fruits and vegetables. ?Fill one fourth of your plate  at each meal with whole grains. Whole grains include whole-wheat pasta, brown rice, and whole-grain bread. ?Eat or drink low-fat dairy products, such as skim milk or low-fat yogurt. ?Fill one fourth of your plate at each meal with low-fat (lean) proteins. Low-fat proteins include fish, chicken without skin, eggs, beans, and tofu. ?Avoid fatty meat, cured and processed meat, or chicken with skin. ?Avoid pre-made or processed food. ?Limit the amount of salt in your diet to less than 1,500 mg each day. ?Do not drink alcohol if: ?Your doctor tells you not to drink. ?You are pregnant, may be pregnant, or are planning to become pregnant. ?If you drink alcohol: ?Limit how much you have to: ?0-1 drink a day for women. ?0-2 drinks a day for men. ?Know how much alcohol is in your drink. In the [place] one drink equals one 12 oz bottle of beer (355 mL), one 5 oz glass of wine (148 mL), or one 1? oz glass of hard liquor (44 mL). ?Lifestyle ? ?Work with your doctor to stay at a healthy weight or to lose weight. Ask your doctor what the best weight is for you. ?Get at least 30 minutes of exercise that causes your heart to beat faster (aerobic exercise) most days of the week. This may include walking, swimming, or biking. ?Get at least 30 minutes of exercise that strengthens your muscles (resistance exercise) at least 3 days a week. This may include lifting weights or doing Pilates. ?Do not smoke or use any products that contain nicotine or tobacco. If you need help quitting, ask your doctor. ?Check your blood pressure at home as told by your doctor. ?Keep all follow-up visits. ?Medicines ?Take over-the-counter and prescription medicines   only as told by your doctor. Follow directions carefully. ?Do not skip doses of blood pressure medicine. The medicine does not work as well if you skip doses. Skipping doses also puts you at risk for problems. ?Ask your doctor about side effects or reactions to medicines that you should watch  for. ?Contact a doctor if: ?You think you are having a reaction to the medicine you are taking. ?You have headaches that keep coming back. ?You feel dizzy. ?You have swelling in your ankles. ?You have trouble with your vision. ?Get help right away if: ?You get a very bad headache. ?You start to feel mixed up (confused). ?You feel weak or numb. ?You feel faint. ?You have very bad pain in your: ?Chest. ?Belly (abdomen). ?You vomit more than once. ?You have trouble breathing. ?These symptoms may be an emergency. Get help right away. Call 911. ?Do not wait to see if the symptoms will go away. ?Do not drive yourself to the hospital. ?Summary ?Hypertension is another name for high blood pressure. ?High blood pressure forces your heart to work harder to pump blood. ?For most people, a normal blood pressure is less than 120/80. ?Making healthy choices can help lower blood pressure. If your blood pressure does not get lower with healthy choices, you may need to take medicine. ?This information is not intended to replace advice given to you by your health care provider. Make sure you discuss any questions you have with your health care provider. ?Document Revised: 01/03/2021 Document Reviewed: 01/03/2021 ?Elsevier Patient Education ? 2023 Elsevier Inc. ? ?

## 2022-01-02 NOTE — Progress Notes (Signed)
Rich Brave Llittleton,acting as a Education administrator for Maximino Greenland, MD.,have documented all relevant documentation on the behalf of Maximino Greenland, MD,as directed by  Maximino Greenland, MD while in the presence of Maximino Greenland, MD.    Subjective:     Patient ID: Nancy Lambert , female    DOB: 10-Jul-1940 , 81 y.o.   MRN: [id number]   Chief Complaint  Patient presents with   Hypertension    HPI  Patient presents today for a bp check. Patient reports compliance with her meds. She denies headaches, chest pain and worsening sob. She has completed PT, feels her leg strength has improved.   Hypertension This is a chronic problem. The current episode started more than 1 year ago. The problem has been gradually improving since onset. The problem is controlled.  x   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[place])    Pulmonary hypertension (HCC)    Scleroderma (HCC)    Status post ablation of incompetent vein using laser bilateral leg 01/2019     Family History  Problem Relation Age of Onset   Breast cancer Mother    Clotting disorder Mother    Diabetes Mother    Breast cancer Sister    Clotting disorder Sister    Prostate cancer Son    Breast cancer Daughter    Other Father        drowned   Breast cancer Sister    Breast cancer Other    Colon cancer Neg Hx    Colon polyps Neg Hx    Esophageal cancer Neg Hx    Rectal cancer Neg Hx    Stomach cancer Neg Hx      Current Outpatient Medications:    amLODipine (NORVASC) 10 MG tablet, Take 1 tablet (10 mg total) by mouth daily., Disp: 30 tablet, Rfl: 3   aspirin EC 81 MG tablet, Take 81 mg by mouth daily., Disp: , Rfl:    omeprazole (PRILOSEC) 40 MG capsule, Take 1 capsule (40 mg total) by mouth daily., Disp: 30 capsule, Rfl: 1   Riociguat (ADEMPAS) 2 MG TABS, Take 1 tablet by mouth 3 (three) times daily., Disp:  252 tablet, Rfl: 0   No Known Allergies   Review of Systems  Constitutional: Negative.   Respiratory: Negative.    Cardiovascular: Negative.   Gastrointestinal: Negative.   Neurological: Negative.   Psychiatric/Behavioral: Negative.       Today's Vitals   01/02/22 1003  BP: 122/80  Pulse: 60  Temp: 98 F (36.7 C)  Weight: 139 lb 9.6 oz (63.3 kg)  Height: '5\' 4"'$  (1.626 m)   Body mass index is 23.96 kg/m.  Wt Readings from Last 3 Encounters:  01/02/22 139 lb 9.6 oz (63.3 kg)  12/25/21 137 lb (62.1 kg)  11/29/21 137 lb (62.1 kg)     Objective:  Physical Exam Vitals and nursing note reviewed.  Constitutional:      Appearance: Normal appearance.  HENT:     Head: Normocephalic and atraumatic.  Cardiovascular:     Rate and Rhythm: Normal rate and regular rhythm.     Heart sounds: Murmur heard.  Pulmonary:     Effort: Pulmonary effort is normal.     Breath sounds: Normal breath sounds.  Musculoskeletal:     Cervical back: Normal range of motion.  Skin:    General: Skin is warm.     Comments: papular lesion on right parietal area, there is no overlying erythema  Neurological:     General: No focal deficit present.     Mental Status: She is alert.  Psychiatric:        Mood and Affect: Mood normal.        Behavior: Behavior normal.         Assessment And Plan:     1. Pulmonary hypertension ([place]) Comments: Chronic, Cardiology notes reviewed.  She will c/w Adempas daily.   2. Essential hypertension, benign Comments: Chronic, well controlled. She is encouraged to follow a low sodium diet.   3. Scalp lesion Comments: She has a papular lesion on right parietal area, declines Derm referral.  4. Food insecurity Comments: She was given information on [facility], along with contact number and times/days of operation.   5. BMI 23.0-23.9, adult Comments: She is encouraged to perform chair exercises and PT exercises to maintain her strength.   6. Influenza  vaccination declined   Patient was given opportunity to ask questions. Patient verbalized understanding of the plan and was able to repeat key elements of the plan. All questions were answered to their satisfaction.   I, Maximino Greenland, MD, have reviewed all documentation for this visit. The documentation on 01/02/22 for the exam, diagnosis, procedures, and orders are all accurate and complete.   IF YOU HAVE BEEN REFERRED TO A SPECIALIST, IT MAY TAKE 1-2 WEEKS TO SCHEDULE/PROCESS THE REFERRAL. IF YOU HAVE NOT HEARD FROM US/SPECIALIST IN TWO WEEKS, PLEASE GIVE [place] A CALL AT [phone].   THE PATIENT IS ENCOURAGED TO PRACTICE SOCIAL DISTANCING DUE TO THE COVID-19 PANDEMIC.

## 2022-01-02 NOTE — Telephone Encounter (Signed)
Omeprazole refilled as requested. She has an upcoming appointment.

## 2022-01-02 NOTE — Telephone Encounter (Signed)
Patient is requesting a refill on Omeprazole. 

## 2022-01-06 ENCOUNTER — Telehealth: Payer: Self-pay

## 2022-01-06 MED ORDER — AMLODIPINE BESYLATE 10 MG PO TABS: 10.0000 mg | ORAL_TABLET | Freq: Every day | ORAL | 3 refills | Status: DC

## 2022-01-06 NOTE — Telephone Encounter (Signed)
Patient called requesting refills on Amlodipine.

## 2022-01-06 NOTE — Telephone Encounter (Signed)
She was started on 5 mg, then eventually increased to 10 mg, when I last saw her she was on 10 mg and blood pressure is well controlled.  Hence needs 10 mg prescriptions, 90 days with 3 refills.

## 2022-01-06 NOTE — Telephone Encounter (Signed)
Called and spoke to patient she voiced understanding

## 2022-02-03 ENCOUNTER — Encounter: Payer: Self-pay | Admitting: Physician Assistant

## 2022-02-03 ENCOUNTER — Ambulatory Visit (INDEPENDENT_AMBULATORY_CARE_PROVIDER_SITE_OTHER): Payer: Medicare Other | Admitting: Physician Assistant

## 2022-02-03 VITALS — BP 160/92 | HR 62 | Ht 64.0 in | Wt 149.0 lb

## 2022-02-03 DIAGNOSIS — K219 Gastro-esophageal reflux disease without esophagitis: Secondary | ICD-10-CM

## 2022-02-03 DIAGNOSIS — M349 Systemic sclerosis, unspecified: Secondary | ICD-10-CM | POA: Diagnosis not present

## 2022-02-03 MED ORDER — OMEPRAZOLE 40 MG PO CPDR: 40.0000 mg | DELAYED_RELEASE_CAPSULE | Freq: Every morning | ORAL | 4 refills | Status: DC

## 2022-02-03 NOTE — Progress Notes (Signed)
Subjective:    Patient ID: Nancy Lambert, female    DOB: 12-16-1940, 81 y.o.   MRN: [id number]  HPI Nancy Lambert is a pleasant 81 year old African-American female established with Dr. Silverio Decamp who comes in today for follow-up of GERD. Patient was last seen here in April 2021. She has history of scleroderma, pulmonary hypertension, hypertension and adenomatous colon polyps. She says that her symptoms are generally well controlled with omeprazole 40 mg p.o. every morning.  She says that some days she forgets to take the medication or just may not take it but knows if she stays off of it for a few days that her symptoms will flare.  She has no complaints of dysphagia or odynophagia.  No abdominal pain.  Bowel movements have been normal, no melena or hematochezia.  Last EGD was done in 2018 with a moderately dilated esophagus, no stricture, there was concern for short segment Barrett's however biopsies did not show any evidence of Barrett's, did show reflux changes. Last colonoscopy May 2022 follow-up for adenomatous polyps, she was noted to have 2 large AVMs in the ascending colon, nonbleeding, scattered diverticulosis, internal and external hemorrhoids and 2 sessile polyps 5 to 7 mm in size. Path on the polyps consistent with tubular adenoma.  No follow-up planned due to age.  Review of Systems Pertinent positive and negative review of systems were noted in the above HPI section.  All other review of systems was otherwise negative.   Outpatient Encounter Medications as of 02/03/2022  Medication Sig   amLODipine (NORVASC) 10 MG tablet Take 1 tablet (10 mg total) by mouth daily.   aspirin EC 81 MG tablet Take 81 mg by mouth daily.   Riociguat (ADEMPAS) 2 MG TABS Take 1 tablet by mouth 3 (three) times daily.   [DISCONTINUED] omeprazole (PRILOSEC) 40 MG capsule Take 1 capsule (40 mg total) by mouth daily.   omeprazole (PRILOSEC) 40 MG capsule Take 1 capsule (40 mg total) by mouth in the morning.    No facility-administered encounter medications on file as of 02/03/2022.   No Known Allergies Patient Active Problem List   Diagnosis Date Noted   Pure hypercholesterolemia 12/16/2021   Encounter for long-term (current) aspirin use 12/16/2021   Callus 11/24/2021   Frequent falls 11/24/2021   Essential hypertension, benign 11/24/2021   Acute right-sided low back pain without sciatica 10/18/2018   Right leg pain 09/07/2018   Scleroderma ([place]) 05/04/2018   Palpitations 05/04/2018   Acute pain of right shoulder 04/07/2018   Dysphagia assoc with limited scleroderma  08/20/2016   Pulmonary hypertension ([place]) 07/23/2015   Dyspnea on exertion 05/28/2015   Social History   Socioeconomic History   Marital status: Widowed    Spouse name: Not on file   Number of children: 4   Years of education: Not on file   Highest education level: Not on file  Occupational History   Occupation: Retired  Tobacco Use   Smoking status: Former    Packs/day: 0.25    Years: 5.00    Total pack years: 1.25    Types: Cigarettes    Quit date: 1971    Years since quitting: 52.8   Smokeless tobacco: Never   Tobacco comments:    very light use when she did smoke  Vaping Use   Vaping Use: Never used  Substance and Sexual Activity   Alcohol use: Yes    Alcohol/week: 3.0 - 5.0 standard drinks of alcohol    Types: 3 - 5 Standard  drinks or equivalent per week    Comment: occasional   Drug use: No   Sexual activity: Not Currently  Other Topics Concern   Not on file  Social History Narrative   Lives at home with her daughter   Right handed   Social Determinants of Health   Financial Resource Strain: Medium Risk (12/25/2021)   Overall Financial Resource Strain (CARDIA)    Difficulty of Paying Living Expenses: Somewhat hard  Food Insecurity: Food Insecurity Present (12/25/2021)   Hunger Vital Sign    Worried About Running Out of Food in the Last Year: Often true    Ran Out of Food in the Last Year:  Often true  Transportation Needs: No Transportation Needs (12/25/2021)   PRAPARE - Hydrologist (Medical): No    Lack of Transportation (Non-Medical): No  Physical Activity: Insufficiently Active (12/25/2021)   Exercise Vital Sign    Days of Exercise per Week: 5 days    Minutes of Exercise per Session: 20 min  Stress: Stress Concern Present (12/25/2021)   [place]    Feeling of Stress : To some extent  Social Connections: Not on file  Intimate Partner Violence: Not At Risk (09/07/2018)   Humiliation, Afraid, Rape, and Kick questionnaire    Fear of Current or Ex-Partner: No    Emotionally Abused: No    Physically Abused: No    Sexually Abused: No    Ms. Gorter's family history includes Breast cancer in her daughter, mother, sister, sister, and another family member; Clotting disorder in her mother and sister; Diabetes in her mother; Other in her father; Prostate cancer in her son.      Objective:    Vitals:   02/03/22 1017  BP: (!) 160/92  Pulse: 62    Physical Exam Well-developed well-nourished eld AA female  in no acute distress.  Height, Weight,149 BMI 25.58  HEENT; nontraumatic normocephalic, EOMI, PE R LA, sclera anicteric. Oropharynx;not examined today  Neck; supple, no JVD, tight skin on neck  Cardiovascular; regular rate and rhythm with S1-S2, no murmur rub or gallop Pulmonary; Clear bilaterally Abdomen; soft, nontender, nondistended, no palpable mass or hepatosplenomegaly, bowel sounds are active Rectal;not done Skin; benign exam, no jaundice rash or appreciable lesions- thickened skin on hands/fingers Extremities; no clubbing cyanosis or edema skin warm and dry Neuro/Psych; alert and oriented x4, grossly nonfocal mood and affect appropriate        Assessment & Plan:   #36 81 year old female with history of chronic GERD, and scleroderma, here for routine follow-up and  medication refill  Symptoms generally well controlled with omeprazole 40 mg p.o. every morning. No complaints of dysphagia or odynophagia  #2 history of adenomatous colon polyps-colonoscopy May 2022, 2 polyps removed, no follow-up planned due to age #3 colonic AVMs, documented at last colonoscopy ascending colon -large  #4 scleroderma #5.  Pulmonary hypertension #6.  Hypertension  Plan; refill omeprazole 40 mg p.o. every morning AC breakfast #90 and 4 refills We discussed the association with scleroderma and GERD as well as esophageal dysmotility and strictures.  She is advised to call should she develop any symptoms of dysphagia, otherwise we will plan to see her back in 1 year.  Nancy Lambert Genia Harold PA-C 02/03/2022   Cc: Glendale Chard, MD

## 2022-02-03 NOTE — Patient Instructions (Signed)
_______________________________________________________  If you are age 79 or older, your body mass index should be between 23-30. Your Body mass index is 25.58 kg/m. If this is out of the aforementioned range listed, please consider follow up with your Primary Care Provider.  If you are age 77 or younger, your body mass index should be between 19-25. Your Body mass index is 25.58 kg/m. If this is out of the aformentioned range listed, please consider follow up with your Primary Care Provider.   We have sent the following medications to your pharmacy for you to pick up at your convenience: Omeprazole 40 mg daily in the morning  The [facility] providers would like to encourage you to use [facility] to communicate with providers for non-urgent requests or questions.  Due to long hold times on the telephone, sending your provider a message by [facility] may be a faster and more efficient way to get a response.  Please allow 48 business hours for a response.  Please remember that this is for non-urgent requests.   It was a pleasure to see you today!  Thank you for trusting me with your gastrointestinal care!

## 2022-02-13 ENCOUNTER — Telehealth: Payer: Self-pay

## 2022-03-04 ENCOUNTER — Ambulatory Visit: Payer: Medicare HMO | Admitting: Podiatry

## 2022-04-04 ENCOUNTER — Other Ambulatory Visit: Payer: Self-pay

## 2022-04-04 MED ORDER — ADEMPAS 2 MG PO TABS: 1.0000 | ORAL_TABLET | Freq: Three times a day (TID) | ORAL | 0 refills | Status: DC

## 2022-04-08 NOTE — Telephone Encounter (Signed)
Chmg-error.  

## 2022-05-19 ENCOUNTER — Encounter: Payer: Self-pay | Admitting: Pharmacist

## 2022-05-26 ENCOUNTER — Ambulatory Visit (INDEPENDENT_AMBULATORY_CARE_PROVIDER_SITE_OTHER): Payer: 59 | Admitting: Internal Medicine

## 2022-05-26 ENCOUNTER — Encounter: Payer: Self-pay | Admitting: Internal Medicine

## 2022-05-26 VITALS — BP 132/80 | HR 92 | Temp 98.2°F | Ht 64.0 in | Wt 136.0 lb

## 2022-05-26 DIAGNOSIS — Z2821 Immunization not carried out because of patient refusal: Secondary | ICD-10-CM | POA: Diagnosis not present

## 2022-05-26 DIAGNOSIS — M349 Systemic sclerosis, unspecified: Secondary | ICD-10-CM

## 2022-05-26 DIAGNOSIS — I272 Pulmonary hypertension, unspecified: Secondary | ICD-10-CM

## 2022-05-26 DIAGNOSIS — E7800 Pure hypercholesterolemia, unspecified: Secondary | ICD-10-CM | POA: Diagnosis not present

## 2022-05-26 DIAGNOSIS — R630 Anorexia: Secondary | ICD-10-CM | POA: Diagnosis not present

## 2022-05-26 LAB — CMP14+EGFR
ALT: 17 IU/L (ref 0–32)
AST: 25 IU/L (ref 0–40)
Albumin/Globulin Ratio: 2 (ref 1.2–2.2)
Albumin: 4.8 g/dL (ref 3.8–4.8)
Alkaline Phosphatase: 74 IU/L (ref 44–121)
BUN/Creatinine Ratio: 12 (ref 12–28)
BUN: 9 mg/dL (ref 8–27)
Bilirubin Total: 0.5 mg/dL (ref 0.0–1.2)
CO2: 24 mmol/L (ref 20–29)
Calcium: 9.9 mg/dL (ref 8.7–10.3)
Chloride: 102 mmol/L (ref 96–106)
Creatinine, Ser: 0.78 mg/dL (ref 0.57–1.00)
Globulin, Total: 2.4 g/dL (ref 1.5–4.5)
Glucose: 86 mg/dL (ref 70–99)
Potassium: 3.7 mmol/L (ref 3.5–5.2)
Sodium: 143 mmol/L (ref 134–144)
Total Protein: 7.2 g/dL (ref 6.0–8.5)
eGFR: 77 mL/min/{1.73_m2} (ref >59)

## 2022-05-26 NOTE — Patient Instructions (Addendum)
The 10-year ASCVD risk score (Arnett DK, et al., 2019) is: 21.9%   Values used to calculate the score:     Age: 82 years     Sex: Female     Is Non-Hispanic African American: Yes     Diabetic: No     Tobacco smoker: No     Systolic Blood Pressure: [id number] mmHg     Is BP treated: Yes     HDL Cholesterol: 71 mg/dL     Total Cholesterol: 200 mg/dL   Hypertension, Adult Hypertension is another name for high blood pressure. High blood pressure forces your heart to work harder to pump blood. This can cause problems over time. There are two numbers in a blood pressure reading. There is a top number (systolic) over a bottom number (diastolic). It is best to have a blood pressure that is below 120/80. What are the causes? The cause of this condition is not known. Some other conditions can lead to high blood pressure. What increases the risk? Some lifestyle factors can make you more likely to develop high blood pressure: Smoking. Not getting enough exercise or physical activity. Being overweight. Having too much fat, sugar, calories, or salt (sodium) in your diet. Drinking too much alcohol. Other risk factors include: Having any of these conditions: Heart disease. Diabetes. High cholesterol. Kidney disease. Obstructive sleep apnea. Having a family history of high blood pressure and high cholesterol. Age. The risk increases with age. Stress. What are the signs or symptoms? High blood pressure may not cause symptoms. Very high blood pressure (hypertensive crisis) may cause: Headache. Fast or uneven heartbeats (palpitations). Shortness of breath. Nosebleed. Vomiting or feeling like you may vomit (nauseous). Changes in how you see. Very bad chest pain. Feeling dizzy. Seizures. How is this treated? This condition is treated by making healthy lifestyle changes, such as: Eating healthy foods. Exercising more. Drinking less alcohol. Your doctor may prescribe medicine if lifestyle changes  do not help enough and if: Your top number is above 130. Your bottom number is above 80. Your personal target blood pressure may vary. Follow these instructions at home: Eating and drinking  If told, follow the DASH eating plan. To follow this plan: Fill one half of your plate at each meal with fruits and vegetables. Fill one fourth of your plate at each meal with whole grains. Whole grains include whole-wheat pasta, brown rice, and whole-grain bread. Eat or drink low-fat dairy products, such as skim milk or low-fat yogurt. Fill one fourth of your plate at each meal with low-fat (lean) proteins. Low-fat proteins include fish, chicken without skin, eggs, beans, and tofu. Avoid fatty meat, cured and processed meat, or chicken with skin. Avoid pre-made or processed food. Limit the amount of salt in your diet to less than 1,500 mg each day. Do not drink alcohol if: Your doctor tells you not to drink. You are pregnant, may be pregnant, or are planning to become pregnant. If you drink alcohol: Limit how much you have to: 0-1 drink a day for women. 0-2 drinks a day for men. Know how much alcohol is in your drink. In the [place] one drink equals one 12 oz bottle of beer (355 mL), one 5 oz glass of wine (148 mL), or one 1 oz glass of hard liquor (44 mL). Lifestyle  Work with your doctor to stay at a healthy weight or to lose weight. Ask your doctor what the best weight is for you. Get at least 30 minutes of  exercise that causes your heart to beat faster (aerobic exercise) most days of the week. This may include walking, swimming, or biking. Get at least 30 minutes of exercise that strengthens your muscles (resistance exercise) at least 3 days a week. This may include lifting weights or doing Pilates. Do not smoke or use any products that contain nicotine or tobacco. If you need help quitting, ask your doctor. Check your blood pressure at home as told by your doctor. Keep all follow-up  visits. Medicines Take over-the-counter and prescription medicines only as told by your doctor. Follow directions carefully. Do not skip doses of blood pressure medicine. The medicine does not work as well if you skip doses. Skipping doses also puts you at risk for problems. Ask your doctor about side effects or reactions to medicines that you should watch for. Contact a doctor if: You think you are having a reaction to the medicine you are taking. You have headaches that keep coming back. You feel dizzy. You have swelling in your ankles. You have trouble with your vision. Get help right away if: You get a very bad headache. You start to feel mixed up (confused). You feel weak or numb. You feel faint. You have very bad pain in your: Chest. Belly (abdomen). You vomit more than once. You have trouble breathing. These symptoms may be an emergency. Get help right away. Call 911. Do not wait to see if the symptoms will go away. Do not drive yourself to the hospital. Summary Hypertension is another name for high blood pressure. High blood pressure forces your heart to work harder to pump blood. For most people, a normal blood pressure is less than 120/80. Making healthy choices can help lower blood pressure. If your blood pressure does not get lower with healthy choices, you may need to take medicine. This information is not intended to replace advice given to you by your health care provider. Make sure you discuss any questions you have with your health care provider. Document Revised: 01/03/2021 Document Reviewed: 01/03/2021 Elsevier Patient Education  [place].

## 2022-05-26 NOTE — Progress Notes (Signed)
I,Victoria T Hamilton,acting as a scribe for Maximino Greenland, MD.,have documented all relevant documentation on the behalf of Maximino Greenland, MD,as directed by  Maximino Greenland, MD while in the presence of Maximino Greenland, MD.    Subjective:     Patient ID: Nancy Lambert , female    DOB: 04/04/1940 , 82 y.o.   MRN: [id number]   Chief Complaint  Patient presents with   Hypertension   Hyperlipidemia    HPI  Patient presents today for a bp & chol check. Patient reports compliance with her meds. She is not taking any chol meds. She denies headaches, chest pain and worsening sob or dizziness. She does admit to decreased appetite. She does report feeling stressed at times.  Pt has no specific questions or concerns.     Hypertension This is a chronic problem. The current episode started more than 1 year ago. The problem has been gradually improving since onset. The problem is controlled. Risk factors for coronary artery disease include post-menopausal state, sedentary lifestyle and dyslipidemia. Past treatments include ACE inhibitors. There is no history of kidney disease or CAD/MI.  Hyperlipidemia This is a chronic problem. The current episode started more than 1 year ago. The problem is uncontrolled. She has no history of diabetes or liver disease. She is currently on no antihyperlipidemic treatment.     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[place])    Pulmonary hypertension (HCC)    Scleroderma (HCC)    Status post ablation of incompetent vein using laser bilateral leg 01/2019     Family History  Problem Relation Age of Onset   Breast cancer Mother    Clotting disorder Mother    Diabetes Mother    Breast cancer Sister    Clotting disorder Sister    Prostate cancer Son    Breast cancer Daughter    Other Father        drowned   Breast cancer Sister     Breast cancer Other    Colon cancer Neg Hx    Colon polyps Neg Hx    Esophageal cancer Neg Hx    Rectal cancer Neg Hx    Stomach cancer Neg Hx      Current Outpatient Medications:    amLODipine (NORVASC) 10 MG tablet, Take 1 tablet (10 mg total) by mouth daily., Disp: 90 tablet, Rfl: 3   aspirin EC 81 MG tablet, Take 81 mg by mouth daily., Disp: , Rfl:    omeprazole (PRILOSEC) 40 MG capsule, Take 1 capsule (40 mg total) by mouth in the morning., Disp: 90 capsule, Rfl: 4   Riociguat (ADEMPAS) 2 MG TABS, Take 1 tablet by mouth 3 (three) times daily., Disp: 252 tablet, Rfl: 0   No Known Allergies   Review of Systems  Constitutional: Negative.   Respiratory: Negative.    Cardiovascular: Negative.   Gastrointestinal: Negative.   Neurological: Negative.   Psychiatric/Behavioral: Negative.       Today's Vitals   05/26/22 1015 05/26/22 1039  BP: 138/80 132/80  Pulse: 92   Temp: 98.2 F (36.8 C)   SpO2: 90%   Weight: 136 lb (61.7 kg)   Height: '5\' 4"'$  (1.626 m)    Body mass index is 23.34 kg/m.  Wt Readings from Last 3 Encounters:  05/26/22 136 lb (61.7 kg)  02/03/22 149 lb (67.6 kg)  01/02/22 139 lb 9.6 oz (63.3 kg)    Objective:  Physical Exam Vitals and nursing note reviewed.  Constitutional:      Appearance: Normal appearance.  HENT:     Head: Normocephalic and atraumatic.     Nose:     Comments: Masked     Mouth/Throat:     Comments: Masked  Eyes:     Extraocular Movements: Extraocular movements intact.  Cardiovascular:     Rate and Rhythm: Normal rate and regular rhythm.     Heart sounds: Murmur heard.  Pulmonary:     Effort: Pulmonary effort is normal.     Breath sounds: Normal breath sounds.  Musculoskeletal:     Cervical back: Normal range of motion.  Skin:    General: Skin is warm.  Neurological:     General: No focal deficit present.     Mental Status: She is alert.  Psychiatric:        Mood and Affect: Mood normal.        Behavior: Behavior  normal.      Assessment And Plan:     1. Pulmonary hypertension (HCC) Comments: Chronic, sx are stable. She will c/w Adempas. She sees Cardiology every 6 months, due March 2024. Their input is appreciated. Scheduled for echo 2/28. - CMP14+EGFR - Amb Referral To Provider Referral Exercise Program (P.R.E.P)  2. Pure hypercholesterolemia Comments: She is not on therapy, per her request. ASCVD risk is 21%. We discussed use of calcium score. - CMP14+EGFR - Amb Referral To Provider Referral Exercise Program (P.R.E.P) - CT CARDIAC SCORING (SELF PAY ONLY); Future  3. Decreased appetite Comments: She admits she is under stress. She declines starting mirtazapine.  4. Scleroderma ([place]) Comments: Chronic, unfortunately has not had recent Rheum referral.She has Raynaud's, GERD, telangiectasia and sclerodactyly. No fingerip ulcers. Sx stable. - Ambulatory referral to Rheumatology  5. Tetanus, diphtheria, and acellular pertussis (Tdap) vaccination declined  6. Herpes zoster vaccination declined   Patient was given opportunity to ask questions. Patient verbalized understanding of the plan and was able to repeat key elements of the plan. All questions were answered to their satisfaction.   I, Maximino Greenland, MD, have reviewed all documentation for this visit. The documentation on 05/26/22 for the exam, diagnosis, procedures, and orders are all accurate and complete.   IF YOU HAVE BEEN REFERRED TO A SPECIALIST, IT MAY TAKE 1-2 WEEKS TO SCHEDULE/PROCESS THE REFERRAL. IF YOU HAVE NOT HEARD FROM US/SPECIALIST IN TWO WEEKS, PLEASE GIVE [place] A CALL AT [phone].   THE PATIENT IS ENCOURAGED TO PRACTICE SOCIAL DISTANCING DUE TO THE COVID-19 PANDEMIC.

## 2022-05-28 ENCOUNTER — Ambulatory Visit: Payer: 59

## 2022-05-28 DIAGNOSIS — R0609 Other forms of dyspnea: Secondary | ICD-10-CM | POA: Diagnosis not present

## 2022-05-28 DIAGNOSIS — I272 Pulmonary hypertension, unspecified: Secondary | ICD-10-CM

## 2022-05-30 ENCOUNTER — Telehealth: Payer: Self-pay

## 2022-05-30 NOTE — Telephone Encounter (Signed)
Call to pt reference PREP referral. Explained program to pt. Is interested. Can start April 30th.  Will call her closer to start of class to schedule intake.  Confirmed pt has my number

## 2022-05-30 NOTE — Telephone Encounter (Signed)
LVMT pt requesting call back to discuss referral to PREP

## 2022-06-06 ENCOUNTER — Encounter: Payer: Self-pay | Admitting: Cardiology

## 2022-06-06 ENCOUNTER — Ambulatory Visit: Payer: 59 | Admitting: Cardiology

## 2022-06-06 VITALS — BP 138/74 | HR 91 | Resp 16 | Ht 64.0 in | Wt 139.0 lb

## 2022-06-06 DIAGNOSIS — I10 Essential (primary) hypertension: Secondary | ICD-10-CM

## 2022-06-06 DIAGNOSIS — R0609 Other forms of dyspnea: Secondary | ICD-10-CM | POA: Diagnosis not present

## 2022-06-06 DIAGNOSIS — I3139 Other pericardial effusion (noninflammatory): Secondary | ICD-10-CM | POA: Diagnosis not present

## 2022-06-06 DIAGNOSIS — I272 Pulmonary hypertension, unspecified: Secondary | ICD-10-CM

## 2022-06-06 NOTE — Progress Notes (Signed)
Primary Physician/Referring:  Glendale Chard, MD  Patient ID: Nancy Lambert, female    DOB: [birth year], 82 y.o.   MRN: [id number]  Chief Complaint  Patient presents with   Pulmonary hypertension   Follow-up    6 month   HPI:    Nancy Lambert  is a 82 y.o. 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 She is on Adempas for pulm HTN and has chronic dyspnea, lumbar radiculopathy.   Patient presents for a 80-monthoffice visit, doing well. No leg edema and no side effects.  Otherwise dyspnea is remained stable No chest pain.  No palpitations.  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    Pulmonary hypertension (HCC)    Scleroderma ([place]    Status post ablation of incompetent vein using laser bilateral leg 01/2019   Past Surgical History:  Procedure Laterality Date   CARDIAC CATHETERIZATION N/A 07/24/2015   Procedure: Right Heart Cath;  Surgeon: JAdrian Prows MD;  [facility];  Service: Cardiovascular;  Laterality: N/A;   COLONOSCOPY     steroid shots  2020   for pain   VESICOVAGINAL FISTULA CLOSURE W/ TAH      Social History   Tobacco Use   Smoking status: Former    Packs/day: 0.25    Years: 5.00    Total pack years: 1.25    Types: Cigarettes    Quit date: 1971    Years since quitting: 53.2   Smokeless tobacco: Never   Tobacco comments:    very light use when she did smoke  Substance Use Topics   Alcohol use: Yes    Alcohol/week: 3.0 - 5.0 standard drinks of alcohol    Types: 3 - 5 Standard drinks or equivalent per week    Comment: occasional   Marital Status: Widowed   ROS  Review of Systems  Cardiovascular:  Positive for dyspnea on exertion. Negative for chest pain and leg swelling.   Objective   Blood pressure 138/74, pulse 91, resp. rate 16, height '5\' 4"'$  (1.626 m), weight 139 lb (63 kg), SpO2 96 %.     06/06/2022    1:13 PM 05/26/2022   10:39 AM 05/26/2022   10:15 AM  Vitals with BMI  Height '5\' 4"'$   '5\' 4"'$   Weight 139 lbs  136 lbs  BMI 20000000 20000000 Systolic [id number] Diastolic 74 80 80  Pulse 91  92    Orthostatic VS for the past 72 hrs (Last 3 readings):  Patient Position BP Location Cuff Size  06/06/22 1313 Sitting Left Arm Normal    Physical Exam Vitals reviewed.  Constitutional:      Appearance: She is well-developed.  Neck:     Vascular: No carotid bruit or JVD.  Cardiovascular:     Rate and Rhythm: Normal rate and regular rhythm.     Pulses: Intact distal pulses.          Dorsalis pedis pulses are 1+ on the right side and 1+ on the left side.       Posterior tibial pulses are 0 on the right side and 0 on the left side.     Heart sounds: Murmur heard.     Harsh midsystolic murmur  is present with a grade of 2/6 at the upper right sternal border.     No gallop.  Pulmonary:     Effort: Pulmonary effort is normal. No accessory muscle usage.     Breath sounds: Normal breath sounds.  Abdominal:     General: Bowel sounds are normal.     Palpations: Abdomen is soft.  Musculoskeletal:     Right lower leg: No edema.     Left lower leg: No edema.  Skin:    Comments: Abnormally dry due to scleroderma     Laboratory examination:   Lab Results  Component Value Date   NA 143 05/26/2022   K 3.7 05/26/2022   CO2 24 05/26/2022   GLUCOSE 86 05/26/2022   BUN 9 05/26/2022   CREATININE 0.78 05/26/2022   CALCIUM 9.9 05/26/2022   EGFR 77 05/26/2022   GFRNONAA 72 11/09/2019    estimated creatinine clearance is 49.2 mL/min (by C-G formula based on SCr of 0.78 mg/dL).      Latest Ref Rng & Units 05/26/2022   11:16 AM 11/21/2021   11:00 AM 06/12/2021   10:27 AM  CMP  Glucose 70 - 99 mg/dL 86  86  85   BUN 8 - 27 mg/dL '9  14  12   '$ Creatinine 0.57 - 1.00 mg/dL 0.78   0.69  0.70   Sodium 134 - 144 mmol/L 143  143  144   Potassium 3.5 - 5.2 mmol/L 3.7  4.0  3.8   Chloride 96 - 106 mmol/L 102  103  102   CO2 20 - 29 mmol/L '24  24  28   '$ Calcium 8.7 - 10.3 mg/dL 9.9  10.1  9.7   Total Protein 6.0 - 8.5 g/dL 7.2  7.2  6.9   Total Bilirubin 0.0 - 1.2 mg/dL 0.5  0.6  0.4   Alkaline Phos 44 - 121 IU/L 74  71  81   AST 0 - 40 IU/L 25  33  18   ALT 0 - 32 IU/L '17  18  10       '$ Latest Ref Rng & Units 11/21/2021   11:00 AM 06/12/2021   10:27 AM 11/15/2020    3:56 PM  CBC  WBC 3.4 - 10.8 x10E3/uL 3.3  4.2  4.1   Hemoglobin 11.1 - 15.9 g/dL 12.5  12.1  12.6   Hematocrit 34.0 - 46.6 % 38.5  37.2  39.9   Platelets 150 - 450 x10E3/uL 316  516  306     Lipid Panel    Component Value Date/Time   CHOL 200 (H) 11/21/2021 1100   TRIG 71 11/21/2021 1100   HDL 71 11/21/2021 1100   CHOLHDL 2.8 11/21/2021 1100   LDLCALC 116 (H) 11/21/2021 1100  NHDL 129  HEMOGLOBIN A1C No results found for: "HGBA1C", "MPG" TSH Recent Labs    08/06/21 1642  TSH 3.060   Allergies  No Known Allergies   Final Medications at End of Visit     Current Outpatient Medications:    amLODipine (NORVASC) 10 MG tablet, Take 1 tablet (10 mg total) by mouth daily., Disp: 90 tablet, Rfl: 3   aspirin EC 81 MG tablet, Take 81 mg by mouth daily., Disp: , Rfl:    omeprazole (PRILOSEC) 40 MG capsule, Take 1 capsule (40 mg total) by mouth in the morning., Disp: 90 capsule, Rfl: 4   Riociguat (ADEMPAS) 2 MG TABS, Take 1 tablet by mouth 3 (three) times daily., Disp:  252 tablet, Rfl: 0  Radiology:   No results found.  Cardiac Studies:   Lexiscan myoview stress test 06/18/2015: 1. Resting EKG demonstrates normal sinus rhythm, left axis deviation, cannot exclude inferior infarct old, Anterior infarct old.  Stress EKG was negative for myocardial ischemia.  Patient exercised for 4 minutes and 5 seconds and achieved 5.93 Mets.  There are frequent PVCs in the recovery phase of the stress test.   Stress terminated due to achieving target heart rate, 86% of MPHR.  Stress symptoms included dyspnea. 2. Myocardial perfusion imaging is normal. Overall left ventricular systolic function was normal without regional wall motion abnormalities. The left ventricular ejection fraction was 62%.  Right heart cath 07/24/2015: Right Heart Pressure:  RA A Wave  13 mmHg,   RA V Wave  9 mmHg,   RA Mean  8 mmHg  RV Systolic Pressure  48 mmHg,   RV Diastolic Pressure  4 mmHg, RV EDP  11 mmHg  PA Systolic Pressure  48 mmHg,   PA Diastolic Pressure  14 mmHg,   PA Mean  29 mmHg. PW A Wave  19 mmHg,   PW V Wave  17 mmHg,   PW Mean  15 mmHg  QP/QS  1    Total pulmonary vascular resistance 7 Wood units.   Impression: Mild to moderate pulmonary hypertension. Wedge pressure although normal, in the upper end of spectrum suggesting the pulmonary hypertension could also be related to mild LV diastolic heart failure. Findings probably more consistent with primary pulmonary hypertension. Clinical correlation recommended.   Lower Venous DVT Study 07/14/2019: BILATERAL:  - No evidence of deep vein thrombosis seen in the lower extremities, bilaterally.   PCV ECHOCARDIOGRAM COMPLETE 05/28/2022  Narrative Echocardiogram 05/28/2022: Normal LV systolic function with visual EF 60-65%. Left ventricle cavity is normal in size.  Moderate concentric hypertrophy of the left ventricle. Normal global wall motion. Indeterminate diastolic filling pattern, normal LAP. Left atrial cavity is moderately dilated at 43.77 ml/m^2. Right atrial cavity is mildly dilated. Mild tricuspid regurgitation. Mild pulmonary hypertension. RVSP measures 38 mmHg. Small pericardial effusion, predominantly located posteriorly.  There is no hemodynamic significance. Compared to 08/13/2021 mild/moderate MR is not seen, mild PHTN is new, moderate pericardial effusion is now mild, otherwise no significant change.     Six Minute Walk - 06/06/22 1330        Six Minute Walk   Supplemental oxygen during test? No    Lap distance in meters  34 meters    Laps Completed 14    Partial lap (in meters) 0 meters    Baseline BP (sitting) 138/74    Baseline Heartrate 94    Baseline SPO2 98 %      Interval Oxygen Saturation and HR    2 Minute Oxygen Saturation % 98 %    2 Minute HR 113    4 Minute Oxygen Saturation % 98 %    4 Minute HR 108    6 Minute Oxygen Saturation % 98 %    6 Minute HR 105      End of Test Values    BP (sitting) 126/58    Heartrate 82    SPO2 98 %      2 Minutes Post Walk Values   Heartrate 113    SPO2 98 %    Stopped or paused before six minutes? No      Interpretation   Distance completed 476 meters  EKG   EKG 06/06/2022: Normal sinus rhythm at rate of 89 bpm, left axis deviation, left anterior fascicular block.  Prolonged progression, cannot exclude anterolateral infarct old.  LVH with repolarization abnormality.  Compared to 08/21/2021, no significant change.  Assessment     ICD-10-CM   1. Pulmonary hypertension (HCC)  I27.20 EKG 12-Lead    6 minute walk    2. Primary hypertension  I10     3. Pericardial effusion  I31.39       No orders of the defined types were placed in this encounter.  There are no discontinued medications.  Recommendations:   Nancy Lambert  is a 81 y.o. 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 She is on Adempas for pulm HTN and has chronic dyspnea, lumbar radiculopathy.  1. Pulmonary hypertension ([place]) I reviewed her 6-minute walk test, excellent walking distance, echocardiogram also reveals preserved LVEF with no significant change in tricuspid regurgitation.  As she has remained stable with regard to [facility] therapy, I have not added second or third agent.  Fortunately she has responded well to riociguat.  2. Primary hypertension Blood pressure  is well-controlled.  Renal function is normal.  3. Pericardial effusion Small pericardial effusion without clinical hemodynamic compromise, most commonly found in pulmonary hypertension.  No clinical evidence of heart failure.  I reviewed her external labs, although LDL is >100, her HDL is high and non-HDL cholesterol is <130 hence I do not think she needs statin therapy in the absence of known vascular disease.  I will see her back in 6 months or sooner if problems.    Adrian Prows, MD, [facility] 06/06/2022, 2:10 PM Office: [phone] Fax: [phone] Pager: [phone]

## 2022-07-01 ENCOUNTER — Other Ambulatory Visit: Payer: Self-pay

## 2022-07-01 MED ORDER — ADEMPAS 2 MG PO TABS: 1.0000 | ORAL_TABLET | Freq: Three times a day (TID) | ORAL | 0 refills | Status: DC

## 2022-07-04 ENCOUNTER — Telehealth: Payer: Self-pay

## 2022-07-04 NOTE — Telephone Encounter (Signed)
Pt returned call. Excited to start PREP on 07/29/22.  Intake scheduled for 07/22/22 at 1pm at [facility]. Will meet pt in lobby. Has my number for contact.

## 2022-07-04 NOTE — Telephone Encounter (Signed)
Call from pt, VM. Checking in on starting PREP.  Returned call to pt and left message confirming her for the 07/29/22 class T/TH at [facility] from 1p-215p. I will contact her the week prior to starting to do intake appt for measurements and goal setting.

## 2022-07-22 NOTE — Progress Notes (Signed)
[facility] PREP Evaluation  Patient Details  Name: Nancy Lambert MRN: [id number] Date of Birth: [birth year] Age: 82 y.o. PCP: Dorothyann Peng, MD  Vitals:   07/22/22 1300  BP: 122/60  Pulse: 60  Weight: 139 lb 3.2 oz (63.1 kg)     [facility] Eval - 07/22/22 1600       [facility] "PREP" Location   [facility] "PREP" Location [facility]      Referral    Referring Provider Allyne Gee    Reason for referral Inactivity;Hypertension    Program Start Date 07/29/22   T/Th 1pm-215pm x 12 wks     Measurement   Waist Circumference 35 inches    Hip Circumference 36 inches    Body fat 36.3 percent      Information for Trainer   Goals Get into an exercise regimen, stay independent    Current Exercise house work    Orthopedic Concerns Low back pain    Pertinent Medical History Pulm HTN, HTN, Scleroderma, dyspnea on exertion    Current Barriers none    Restrictions/Precautions Fall risk    Medications that affect exercise Medication causing dizziness/drowsiness      Timed Up and Go (TUGS)   Timed Up and Go Low risk <9 seconds      Mobility and Daily Activities   I find it easy to walk up or down two or more flights of stairs. 4    I have no trouble taking out the trash. 4    I do housework such as vacuuming and dusting on my own without difficulty. 4    I can easily lift a gallon of milk (8lbs). 4    I can easily walk a mile. 4    I have no trouble reaching into high cupboards or reaching down to pick up something from the floor. 4    I do not have trouble doing out-door work such as Loss adjuster, chartered, raking leaves, or gardening. 2      Mobility and Daily Activities   I feel younger than my age. 2    I feel independent. 4    I feel energetic. 2    I live an active life.  1    I feel strong. 4    I feel healthy. 2    I feel active as other people my age. 4      How fit and strong are you.   Fit and Strong Total Score 45            Past Medical History:  Diagnosis Date   Anxiety     Cardiac arrhythmia    Glaucoma    Hyperlipidemia    Hypertension    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HCC)    Pulmonary hypertension (HCC)    Scleroderma (HCC)    Status post ablation of incompetent vein using laser bilateral leg 01/2019   Past Surgical History:  Procedure Laterality Date   CARDIAC CATHETERIZATION N/A 07/24/2015   Procedure: Right Heart Cath;  Surgeon: Yates Decamp, MD;  Location: [facility] INVASIVE CV LAB;  Service: Cardiovascular;  Laterality: N/A;   COLONOSCOPY     steroid shots  2020   for pain   VESICOVAGINAL FISTULA CLOSURE W/ TAH     Social History   Tobacco Use  Smoking Status Former   Packs/day: 0.25   Years: 5.00   Additional pack years: 0.00   Total pack years: 1.25   Types: Cigarettes   Quit date: 61  Years since quitting: 53.3  Smokeless Tobacco Never  Tobacco Comments   very light use when she did smoke    [name] 07/22/2022, 4:20 PM

## 2022-07-28 ENCOUNTER — Telehealth: Payer: Self-pay

## 2022-07-28 NOTE — Telephone Encounter (Signed)
Call to pt reference PREP start change from 07/29/22 to 08/05/22 at [facility] due to Brewing technologist

## 2022-08-07 NOTE — Progress Notes (Signed)
[facility] PREP Weekly Session  Patient Details  Name: Nancy Lambert MRN: [id number] Date of Birth: October 28, 1940 Age: 82 y.o. PCP: Dorothyann Peng, MD  There were no vitals filed for this visit.   [facility] Weekly seesion - 08/07/22 1500       [facility] "PREP" Location   [facility] "PREP" Location [facility]      Weekly Session   Topic Discussed Goal setting and welcome to the program    Classes attended to date 1             Nancy Lambert 08/07/2022, 3:50 PM

## 2022-08-14 NOTE — Progress Notes (Signed)
[facility] PREP Weekly Session  Patient Details  Name: Nancy Lambert MRN: [id number] Date of Birth: 30-Apr-1940 Age: 82 y.o. PCP: Dorothyann Peng, MD  Vitals:   08/12/22 1430  Weight: 139 lb (63 kg)     [facility] Weekly seesion - 08/14/22 1600       [facility] "PREP" Location   [facility] "PREP" Location [facility]      Weekly Session   Topic Discussed Importance of resistance training;Other ways to be active    Minutes exercised this week --   none   Classes attended to date 2             Bonnye Fava 08/14/2022, 4:08 PM

## 2022-08-21 ENCOUNTER — Ambulatory Visit: Payer: 59 | Admitting: Family Medicine

## 2022-09-02 ENCOUNTER — Ambulatory Visit (HOSPITAL_COMMUNITY): Payer: Medicare Other

## 2022-09-03 ENCOUNTER — Telehealth: Payer: Self-pay

## 2022-09-03 NOTE — Telephone Encounter (Signed)
Call to pt reference PREP. Still struggling with diarrhea, doesn't know what's going on. Unsure if she has MD f/u on Friday but recommended she f/u with MD.  She will call MD office and find out.  Asked if she wanted to hold on continuing PREP. Wants to check with MD first before deciding. Asked that she check and call me back with answer

## 2022-09-10 ENCOUNTER — Other Ambulatory Visit: Payer: Self-pay

## 2022-09-10 MED ORDER — ADEMPAS 2 MG PO TABS: 1.0000 | ORAL_TABLET | Freq: Three times a day (TID) | ORAL | 0 refills | Status: DC

## 2022-09-26 ENCOUNTER — Other Ambulatory Visit: Payer: Self-pay

## 2022-09-26 MED ORDER — ADEMPAS 2 MG PO TABS: 1.0000 | ORAL_TABLET | Freq: Three times a day (TID) | ORAL | 0 refills | Status: DC

## 2022-09-30 ENCOUNTER — Ambulatory Visit (INDEPENDENT_AMBULATORY_CARE_PROVIDER_SITE_OTHER): Payer: 59 | Admitting: Internal Medicine

## 2022-09-30 ENCOUNTER — Encounter: Payer: Self-pay | Admitting: Internal Medicine

## 2022-09-30 VITALS — BP 120/64 | HR 75 | Temp 98.2°F | Ht 64.0 in | Wt 131.8 lb

## 2022-09-30 DIAGNOSIS — Z6822 Body mass index (BMI) 22.0-22.9, adult: Secondary | ICD-10-CM

## 2022-09-30 DIAGNOSIS — R634 Abnormal weight loss: Secondary | ICD-10-CM

## 2022-09-30 DIAGNOSIS — R197 Diarrhea, unspecified: Secondary | ICD-10-CM | POA: Diagnosis not present

## 2022-09-30 DIAGNOSIS — Z2821 Immunization not carried out because of patient refusal: Secondary | ICD-10-CM | POA: Diagnosis not present

## 2022-09-30 DIAGNOSIS — E7800 Pure hypercholesterolemia, unspecified: Secondary | ICD-10-CM | POA: Diagnosis not present

## 2022-09-30 DIAGNOSIS — I272 Pulmonary hypertension, unspecified: Secondary | ICD-10-CM | POA: Diagnosis not present

## 2022-09-30 MED ORDER — MIRTAZAPINE 7.5 MG PO TABS: 7.5000 mg | ORAL_TABLET | Freq: Every day | ORAL | 1 refills | Status: AC

## 2022-09-30 NOTE — Patient Instructions (Signed)
Hypertension, Adult Hypertension is another name for high blood pressure. High blood pressure forces your heart to work harder to pump blood. This can cause problems over time. There are two numbers in a blood pressure reading. There is a top number (systolic) over a bottom number (diastolic). It is best to have a blood pressure that is below 120/80. What are the causes? The cause of this condition is not known. Some other conditions can lead to high blood pressure. What increases the risk? Some lifestyle factors can make you more likely to develop high blood pressure: Smoking. Not getting enough exercise or physical activity. Being overweight. Having too much fat, sugar, calories, or salt (sodium) in your diet. Drinking too much alcohol. Other risk factors include: Having any of these conditions: Heart disease. Diabetes. High cholesterol. Kidney disease. Obstructive sleep apnea. Having a family history of high blood pressure and high cholesterol. Age. The risk increases with age. Stress. What are the signs or symptoms? High blood pressure may not cause symptoms. Very high blood pressure (hypertensive crisis) may cause: Headache. Fast or uneven heartbeats (palpitations). Shortness of breath. Nosebleed. Vomiting or feeling like you may vomit (nauseous). Changes in how you see. Very bad chest pain. Feeling dizzy. Seizures. How is this treated? This condition is treated by making healthy lifestyle changes, such as: Eating healthy foods. Exercising more. Drinking less alcohol. Your doctor may prescribe medicine if lifestyle changes do not help enough and if: Your top number is above 130. Your bottom number is above 80. Your personal target blood pressure may vary. Follow these instructions at home: Eating and drinking  If told, follow the DASH eating plan. To follow this plan: Fill one half of your plate at each meal with fruits and vegetables. Fill one fourth of your plate  at each meal with whole grains. Whole grains include whole-wheat pasta, brown rice, and whole-grain bread. Eat or drink low-fat dairy products, such as skim milk or low-fat yogurt. Fill one fourth of your plate at each meal with low-fat (lean) proteins. Low-fat proteins include fish, chicken without skin, eggs, beans, and tofu. Avoid fatty meat, cured and processed meat, or chicken with skin. Avoid pre-made or processed food. Limit the amount of salt in your diet to less than 1,500 mg each day. Do not drink alcohol if: Your doctor tells you not to drink. You are pregnant, may be pregnant, or are planning to become pregnant. If you drink alcohol: Limit how much you have to: 0-1 drink a day for women. 0-2 drinks a day for men. Know how much alcohol is in your drink. In the [place] one drink equals one 12 oz bottle of beer (355 mL), one 5 oz glass of wine (148 mL), or one 1 oz glass of hard liquor (44 mL). Lifestyle  Work with your doctor to stay at a healthy weight or to lose weight. Ask your doctor what the best weight is for you. Get at least 30 minutes of exercise that causes your heart to beat faster (aerobic exercise) most days of the week. This may include walking, swimming, or biking. Get at least 30 minutes of exercise that strengthens your muscles (resistance exercise) at least 3 days a week. This may include lifting weights or doing Pilates. Do not smoke or use any products that contain nicotine or tobacco. If you need help quitting, ask your doctor. Check your blood pressure at home as told by your doctor. Keep all follow-up visits. Medicines Take over-the-counter and prescription medicines   only as told by your doctor. Follow directions carefully. Do not skip doses of blood pressure medicine. The medicine does not work as well if you skip doses. Skipping doses also puts you at risk for problems. Ask your doctor about side effects or reactions to medicines that you should watch  for. Contact a doctor if: You think you are having a reaction to the medicine you are taking. You have headaches that keep coming back. You feel dizzy. You have swelling in your ankles. You have trouble with your vision. Get help right away if: You get a very bad headache. You start to feel mixed up (confused). You feel weak or numb. You feel faint. You have very bad pain in your: Chest. Belly (abdomen). You vomit more than once. You have trouble breathing. These symptoms may be an emergency. Get help right away. Call 911. Do not wait to see if the symptoms will go away. Do not drive yourself to the hospital. Summary Hypertension is another name for high blood pressure. High blood pressure forces your heart to work harder to pump blood. For most people, a normal blood pressure is less than 120/80. Making healthy choices can help lower blood pressure. If your blood pressure does not get lower with healthy choices, you may need to take medicine. This information is not intended to replace advice given to you by your health care provider. Make sure you discuss any questions you have with your health care provider. Document Revised: 01/03/2021 Document Reviewed: 01/03/2021 Elsevier Patient Education  2024 [employer] Inc[employer]  

## 2022-09-30 NOTE — Progress Notes (Signed)
I,[name], CMA,acting as a Neurosurgeon for [name], MD.,have documented all relevant documentation on the behalf of [name], MD,as directed by  [name], MD while in the presence of [name], MD.  Subjective:  Patient ID: Nancy Lambert , female    DOB: [birth year] , 82 y.o.   MRN: [id number]  Chief Complaint  Patient presents with   Hypertension   Hyperlipidemia    HPI  Patient presents today for a bp & chol check. Patient reports compliance with her meds. She does not take anything prescribed for cholesterol. She denies headaches, chest pain and worsening sob or dizziness.   She admits visiting [facility] program twice. She reports not caring for the program, states there were too many people and she didn't like having to wipe down her own equipment.   Lastly, she admits she has recently had increased diarrhea. She is not sure what is contributing to her symptoms. .  She states her sx started about 2 weeks ago. No fever/chills. Denies eating at any new restaurants.  No recent travel.    Hypertension This is a chronic problem. The current episode started more than 1 year ago. The problem has been gradually improving since onset. The problem is controlled. Risk factors for coronary artery disease include post-menopausal state, sedentary lifestyle and dyslipidemia. Past treatments include ACE inhibitors. There is no history of kidney disease or CAD/MI.  Hyperlipidemia This is a chronic problem. The current episode started more than 1 year ago. The problem is uncontrolled. She has no history of diabetes or liver disease. She is currently on no antihyperlipidemic treatment. The current treatment provides moderate improvement of lipids. Risk factors for coronary artery disease include hypertension, post-menopausal, a sedentary lifestyle and dyslipidemia.     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     Family History  Problem Relation Age of Onset   Breast cancer Mother    Clotting disorder Mother    Diabetes Mother    Breast cancer Sister    Clotting disorder Sister    Prostate cancer Son    Breast cancer Daughter    Other Father        drowned   Breast cancer Sister    Breast cancer Other    Colon cancer Neg Hx    Colon polyps Neg Hx    Esophageal cancer Neg Hx    Rectal cancer Neg Hx    Stomach cancer Neg Hx      Current Outpatient Medications:    amLODipine (NORVASC) 10 MG tablet, Take 1 tablet (10 mg total) by mouth daily., Disp: 90 tablet, Rfl: 3   aspirin EC 81 MG tablet, Take 81 mg by mouth daily., Disp: , Rfl:    mirtazapine (REMERON) 7.5 MG tablet, Take 1 tablet (7.5 mg total) by mouth at bedtime., Disp: 30 tablet, Rfl: 1   omeprazole (PRILOSEC) 40 MG capsule, Take 1 capsule (40 mg total) by mouth in the morning., Disp: 90 capsule, Rfl: 4   Riociguat (ADEMPAS) 2 MG TABS, Take 1 tablet by mouth 3 (three) times daily., Disp: 252 tablet, Rfl: 0   No Known Allergies   Review  of Systems  Constitutional: Negative.   HENT: Negative.    Respiratory: Negative.    Cardiovascular: Negative.   Gastrointestinal:  Positive for diarrhea.  Musculoskeletal: Negative.   Neurological: Negative.   Psychiatric/Behavioral: Negative.       Today's Vitals   09/30/22 1035  BP: 120/64  Pulse: 75  Temp: 98.2 F (36.8 C)  SpO2: 98%  Weight: 131 lb 12.8 oz (59.8 kg)  Height: 5\' 4"  (1.626 m)   Body mass index is 22.62 kg/m.  Wt Readings from Last 3 Encounters:  09/30/22 131 lb 12.8 oz (59.8 kg)  08/12/22 139 lb (63 kg)  07/22/22 139 lb 3.2 oz (63.1 kg)    The 10-year ASCVD risk score (Arnett DK, et al., 2019) is: 17.4%   Values used to calculate the score:      Age: 37 years     Sex: Female     Is Non-Hispanic African American: Yes     Diabetic: No     Tobacco smoker: No     Systolic Blood Pressure: 120 mmHg     Is BP treated: Yes     HDL Cholesterol: 67 mg/dL     Total Cholesterol: 178 mg/dL   Objective:  Physical Exam Vitals and nursing note reviewed.  Constitutional:      Appearance: Normal appearance.  HENT:     Head: Normocephalic and atraumatic.  Eyes:     Extraocular Movements: Extraocular movements intact.  Cardiovascular:     Rate and Rhythm: Normal rate and regular rhythm.     Heart sounds: Normal heart sounds.  Pulmonary:     Effort: Pulmonary effort is normal.     Breath sounds: Normal breath sounds.  Abdominal:     General: Bowel sounds are normal. There is no distension.     Palpations: Abdomen is soft.     Tenderness: There is no abdominal tenderness.  Musculoskeletal:     Cervical back: Normal range of motion.  Skin:    General: Skin is warm.  Neurological:     General: No focal deficit present.     Mental Status: She is alert.  Psychiatric:        Mood and Affect: Mood normal.        Behavior: Behavior normal.         Assessment And Plan:  Pulmonary hypertension (HCC) Assessment & Plan: Per Cardiology, she has responded well to riociguat. Most recent Cardiology notes reviewed in detail.    Pure hypercholesterolemia Assessment & Plan: ASCVD risk is 17%. She is not on statin therapy at this time. I will recheck a lipid panel today. She is encouraged to follow heart healthy lifestyle.   Orders: -     Lipid panel -     TSH  Diarrhea, unspecified type Assessment & Plan: Resolving. Advised to eat mashed/baked potatoes. She will let me know if her sx recur. If so, will consider stool studies. I will also check electrolytes today.   Orders: -     CBC -     CMP14+EGFR -     Magnesium -     TSH  Weight loss, unintentional Assessment & Plan: She has lost 8 lbs since May 2024, likely lost most of it  during episodes of diarrhea. She is encouraged to increase her intake of nutrient rich foods and healthy snacks Also, given samples of Boost to supplement her caloric intake. Advised to drink only 1/2 bottle daily. She also agrees to start mirtazapine 7.5mg  nightly  to possibly increase her appetite. She agrees to f/u in eight weeks.   Orders: -     Prealbumin  Herpes zoster vaccination declined  Other orders -     Mirtazapine; Take 1 tablet (7.5 mg total) by mouth at bedtime.  Dispense: 30 tablet; Refill: 1     Return if symptoms worsen or fail to improve.  Patient was given opportunity to ask questions. Patient verbalized understanding of the plan and was able to repeat key elements of the plan. All questions were answered to their satisfaction.    I, [name], MD, have reviewed all documentation for this visit. The documentation on 09/30/22 for the exam, diagnosis, procedures, and orders are all accurate and complete.   IF YOU HAVE BEEN REFERRED TO A SPECIALIST, IT MAY TAKE 1-2 WEEKS TO SCHEDULE/PROCESS THE REFERRAL. IF YOU HAVE NOT HEARD FROM US/SPECIALIST IN TWO WEEKS, PLEASE GIVE [place] A CALL AT [phone].   THE PATIENT IS ENCOURAGED TO PRACTICE SOCIAL DISTANCING DUE TO THE COVID-19 PANDEMIC.

## 2022-10-01 LAB — CBC
Hematocrit: 37 % (ref 34.0–46.6)
Hemoglobin: 12.4 g/dL (ref 11.1–15.9)
MCH: 30.3 pg (ref 26.6–33.0)
MCHC: 33.5 g/dL (ref 31.5–35.7)
MCV: 91 fL (ref 79–97)
Platelets: 316 10*3/uL (ref 150–450)
RBC: 4.09 x10E6/uL (ref 3.77–5.28)
RDW: 13.1 % (ref 11.7–15.4)
WBC: 3.8 10*3/uL (ref 3.4–10.8)

## 2022-10-01 LAB — LIPID PANEL
Chol/HDL Ratio: 2.7 ratio (ref 0.0–4.4)
Cholesterol, Total: 178 mg/dL (ref 100–199)
HDL: 67 mg/dL (ref >39)
LDL Chol Calc (NIH): 98 mg/dL (ref 0–99)
Triglycerides: 66 mg/dL (ref 0–149)
VLDL Cholesterol Cal: 13 mg/dL (ref 5–40)

## 2022-10-01 LAB — CMP14+EGFR
ALT: 16 IU/L (ref 0–32)
AST: 24 IU/L (ref 0–40)
Albumin: 4.5 g/dL (ref 3.8–4.8)
Alkaline Phosphatase: 76 IU/L (ref 44–121)
BUN/Creatinine Ratio: 19 (ref 12–28)
BUN: 14 mg/dL (ref 8–27)
Bilirubin Total: 0.6 mg/dL (ref 0.0–1.2)
CO2: 23 mmol/L (ref 20–29)
Calcium: 9.7 mg/dL (ref 8.7–10.3)
Chloride: 103 mmol/L (ref 96–106)
Creatinine, Ser: 0.75 mg/dL (ref 0.57–1.00)
Globulin, Total: 2.4 g/dL (ref 1.5–4.5)
Glucose: 89 mg/dL (ref 70–99)
Potassium: 4 mmol/L (ref 3.5–5.2)
Sodium: 143 mmol/L (ref 134–144)
Total Protein: 6.9 g/dL (ref 6.0–8.5)
eGFR: 81 mL/min/{1.73_m2} (ref >59)

## 2022-10-01 LAB — PREALBUMIN: PREALBUMIN: 17 mg/dL (ref 9–32)

## 2022-10-01 LAB — TSH: TSH: 2.45 u[IU]/mL (ref 0.450–4.500)

## 2022-10-01 LAB — MAGNESIUM: Magnesium: 1.8 mg/dL (ref 1.6–2.3)

## 2022-10-06 ENCOUNTER — Other Ambulatory Visit (INDEPENDENT_AMBULATORY_CARE_PROVIDER_SITE_OTHER): Payer: 59

## 2022-10-06 DIAGNOSIS — R634 Abnormal weight loss: Secondary | ICD-10-CM | POA: Diagnosis not present

## 2022-10-06 LAB — HEMOCCULT GUIAC POC 1CARD (OFFICE)
Card #1 Date: 7052024
Card #2 Date: 7062024
Card #2 Fecal Occult Blod, POC: NEGATIVE
Card #3 Date: 7082024
Card #3 Fecal Occult Blood, POC: NEGATIVE
Fecal Occult Blood, POC: NEGATIVE

## 2022-10-23 DIAGNOSIS — R634 Abnormal weight loss: Secondary | ICD-10-CM | POA: Insufficient documentation

## 2022-10-23 DIAGNOSIS — R197 Diarrhea, unspecified: Secondary | ICD-10-CM | POA: Insufficient documentation

## 2022-10-23 DIAGNOSIS — Z6823 Body mass index (BMI) 23.0-23.9, adult: Secondary | ICD-10-CM | POA: Insufficient documentation

## 2022-10-23 DIAGNOSIS — Z6822 Body mass index (BMI) 22.0-22.9, adult: Secondary | ICD-10-CM | POA: Insufficient documentation

## 2022-10-23 NOTE — Assessment & Plan Note (Addendum)
She has lost 8 lbs since May 2024, likely lost most of it during episodes of diarrhea. She is encouraged to increase her intake of nutrient rich foods and healthy snacks Also, given samples of Boost to supplement her caloric intake. Advised to drink only 1/2 bottle daily. She also agrees to start mirtazapine 7.5mg  nightly to possibly increase her appetite. She agrees to f/u in eight weeks.

## 2022-10-23 NOTE — Assessment & Plan Note (Signed)
Per Cardiology, she has responded well to riociguat. Most recent Cardiology notes reviewed in detail.

## 2022-10-23 NOTE — Assessment & Plan Note (Signed)
ASCVD risk is 17%. She is not on statin therapy at this time. I will recheck a lipid panel today. She is encouraged to follow heart healthy lifestyle.

## 2022-10-23 NOTE — Assessment & Plan Note (Deleted)
She has lost 8 lbs since May 2024, likely lost most of it during episodes of diarrhea. She is encouraged to increase her intake of nutrient rich foods and healthy snacks Also, given samples of Boost to supplement her caloric intake. Advised to drink only 1/2 bottle daily.

## 2022-10-23 NOTE — Assessment & Plan Note (Addendum)
Resolving. Advised to eat mashed/baked potatoes. She will let me know if her sx recur. If so, will consider stool studies. I will also check electrolytes today.

## 2022-12-03 ENCOUNTER — Encounter: Payer: Self-pay | Admitting: Internal Medicine

## 2022-12-03 ENCOUNTER — Ambulatory Visit (INDEPENDENT_AMBULATORY_CARE_PROVIDER_SITE_OTHER): Payer: 59 | Admitting: Internal Medicine

## 2022-12-03 VITALS — BP 108/70 | Temp 97.9°F | Ht 64.0 in | Wt 137.4 lb

## 2022-12-03 DIAGNOSIS — Z1231 Encounter for screening mammogram for malignant neoplasm of breast: Secondary | ICD-10-CM | POA: Diagnosis not present

## 2022-12-03 DIAGNOSIS — Z6823 Body mass index (BMI) 23.0-23.9, adult: Secondary | ICD-10-CM

## 2022-12-03 DIAGNOSIS — Z0000 Encounter for general adult medical examination without abnormal findings: Secondary | ICD-10-CM | POA: Diagnosis not present

## 2022-12-03 DIAGNOSIS — E2839 Other primary ovarian failure: Secondary | ICD-10-CM | POA: Insufficient documentation

## 2022-12-03 DIAGNOSIS — I119 Hypertensive heart disease without heart failure: Secondary | ICD-10-CM | POA: Diagnosis not present

## 2022-12-03 DIAGNOSIS — E7800 Pure hypercholesterolemia, unspecified: Secondary | ICD-10-CM

## 2022-12-03 DIAGNOSIS — M349 Systemic sclerosis, unspecified: Secondary | ICD-10-CM | POA: Diagnosis not present

## 2022-12-03 DIAGNOSIS — M8580 Other specified disorders of bone density and structure, unspecified site: Secondary | ICD-10-CM | POA: Insufficient documentation

## 2022-12-03 DIAGNOSIS — Z638 Other specified problems related to primary support group: Secondary | ICD-10-CM | POA: Insufficient documentation

## 2022-12-03 DIAGNOSIS — M85851 Other specified disorders of bone density and structure, right thigh: Secondary | ICD-10-CM

## 2022-12-03 DIAGNOSIS — I272 Pulmonary hypertension, unspecified: Secondary | ICD-10-CM

## 2022-12-03 DIAGNOSIS — R1319 Other dysphagia: Secondary | ICD-10-CM

## 2022-12-03 LAB — POCT URINALYSIS DIPSTICK
Bilirubin, UA: NEGATIVE
Glucose, UA: NEGATIVE
Ketones, UA: NEGATIVE
Leukocytes, UA: NEGATIVE
Nitrite, UA: NEGATIVE
Protein, UA: NEGATIVE
Spec Grav, UA: 1.02 (ref 1.010–1.025)
Urobilinogen, UA: 1 U/dL
pH, UA: 6 (ref 5.0–8.0)

## 2022-12-03 NOTE — Patient Instructions (Signed)

## 2022-12-03 NOTE — Assessment & Plan Note (Signed)
She was congratulated on her 6lb weight gain. She was reminded of need for adequate nutrition for improved immunity during cold/flu/COVID season. She has been using Ensure as meal supplements every other day. Unable to take daily due to the cost. She was given more samples today.

## 2022-12-03 NOTE — Assessment & Plan Note (Signed)
Chronic, not on statin therapy. She does not wish to take additional meds.

## 2022-12-03 NOTE — Assessment & Plan Note (Signed)
She admits to tension with her sister. She agrees to SW evaluation.

## 2022-12-03 NOTE — Assessment & Plan Note (Signed)
Chronic, well controlled.  She will continue with amlodipine 10mg  daily. She is encouraged to follow low sodium diet. EKG performed, no new changes. She will f/u in four months for re-evaluation.

## 2022-12-03 NOTE — Progress Notes (Signed)
I,[name], CMA,acting as a Neurosurgeon for [name], MD.,have documented all relevant documentation on the behalf of [name], MD,as directed by  [name], MD while in the presence of [name], MD.  Subjective:    Patient ID: Nancy Lambert , female    DOB: [birth year] , 82 y.o.   MRN: [id number]  Chief Complaint  Patient presents with   Annual Exam   Hypertension   Hyperlipidemia    HPI  The patient is here today for a physical examination.  She reports compliance with meds.  She admits she doesn't get a lot of exercise, but does move around her home a lot. She denies headaches, palpitations and chest pain.  She adds that the diarrhea she was experiencing has lessened as well.   Hypertension This is a chronic problem. The current episode started more than 1 year ago. The problem has been gradually improving since onset. The problem is controlled. Risk factors for coronary artery disease include post-menopausal state, sedentary lifestyle and dyslipidemia. Past treatments include ACE inhibitors. There is no history of kidney disease or CAD/MI.  Hyperlipidemia This is a chronic problem. The current episode started more than 1 year ago. The problem is uncontrolled. She has no history of diabetes or liver disease. She is currently on no antihyperlipidemic treatment. The current treatment provides moderate improvement of lipids. Risk factors for coronary artery disease include hypertension, post-menopausal, a sedentary lifestyle and dyslipidemia.     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HCC)    Pulmonary hypertension (HCC)    Scleroderma (HCC)    Status post ablation of incompetent vein using laser bilateral leg 01/2019     Family History  Problem Relation Age of Onset   Breast cancer Mother    Clotting disorder Mother     Diabetes Mother    Other Father        drowned   Breast cancer Sister    Clotting disorder Sister    Breast cancer Sister    Breast cancer Daughter    Prostate cancer Son    Breast cancer Other    Colon cancer Neg Hx    Colon polyps Neg Hx    Esophageal cancer Neg Hx    Rectal cancer Neg Hx    Stomach cancer Neg Hx      Current Outpatient Medications:    aspirin EC 81 MG tablet, Take 81 mg by mouth daily., Disp: , Rfl:    mirtazapine (REMERON) 7.5 MG tablet, Take 1 tablet (7.5 mg total) by mouth at bedtime., Disp: 30 tablet, Rfl: 1   omeprazole (PRILOSEC) 40 MG capsule, Take 1 capsule (40 mg total) by mouth in the morning., Disp: 90 capsule, Rfl: 4   Riociguat (ADEMPAS) 2 MG TABS, Take 1 tablet by mouth 3 (three) times daily., Disp: 252 tablet, Rfl: 0   amLODipine (NORVASC) 5 MG tablet, Take 1 tablet (5 mg total) by mouth daily., Disp: , Rfl:    losartan-hydrochlorothiazide (HYZAAR) 50-12.5 MG tablet, Take 1 tablet by mouth every morning., Disp: 30 tablet, Rfl: 2   No Known Allergies    The patient states she uses post menopausal status for birth control. No LMP recorded. Patient is postmenopausal.. Negative for Dysmenorrhea. Negative for: breast discharge, breast lump(s), breast pain and breast self exam. Associated symptoms  include abnormal vaginal bleeding. Pertinent negatives include abnormal bleeding (hematology), anxiety, decreased libido, depression, difficulty falling sleep, dyspareunia, history of infertility, nocturia, sexual dysfunction, sleep disturbances, urinary incontinence, urinary urgency, vaginal discharge and vaginal itching. Diet regular.The patient states her exercise level is intermittent.   . The patient's tobacco use is:  Social History   Tobacco Use  Smoking Status Former   Current packs/day: 0.00   Average packs/day: 0.3 packs/day for 5.0 years (1.3 ttl pk-yrs)   Types: Cigarettes   Start date: 76   Quit date: 1971   Years since quitting: 53.7   Smokeless Tobacco Never  Tobacco Comments   very light use when she did smoke  . She has been exposed to passive smoke. The patient's alcohol use is:  Social History   Substance and Sexual Activity  Alcohol Use Yes   Alcohol/week: 3.0 - 5.0 standard drinks of alcohol   Types: 3 - 5 Standard drinks or equivalent per week   Comment: occasional   Review of Systems  Constitutional: Negative.   HENT: Negative.    Eyes: Negative.   Respiratory: Negative.    Cardiovascular: Negative.   Gastrointestinal: Negative.   Endocrine: Negative.   Genitourinary: Negative.   Musculoskeletal: Negative.   Skin: Negative.   Allergic/Immunologic: Negative.   Neurological: Negative.   Hematological: Negative.   Psychiatric/Behavioral: Negative.         She admits she is under a lot of stress. Sometimes there is conflict with family members.      Today's Vitals   12/03/22 0854  BP: 108/70  Temp: 97.9 F (36.6 C)  SpO2: 98%  Weight: 137 lb 6.4 oz (62.3 kg)  Height: 5\' 4"  (1.626 m)   Body mass index is 23.58 kg/m.  Wt Readings from Last 3 Encounters:  12/08/22 137 lb 3.2 oz (62.2 kg)  12/03/22 137 lb 6.4 oz (62.3 kg)  09/30/22 131 lb 12.8 oz (59.8 kg)     Objective:  Physical Exam Vitals and nursing note reviewed.  Constitutional:      Appearance: Normal appearance.  HENT:     Head: Normocephalic and atraumatic.     Right Ear: Tympanic membrane, ear canal and external ear normal.     Left Ear: Tympanic membrane, ear canal and external ear normal.     Nose: Nose normal.     Mouth/Throat:     Mouth: Mucous membranes are moist.     Pharynx: Oropharynx is clear.  Eyes:     Extraocular Movements: Extraocular movements intact.     Conjunctiva/sclera: Conjunctivae normal.     Pupils: Pupils are equal, round, and reactive to light.  Cardiovascular:     Rate and Rhythm: Normal rate and regular rhythm.     Pulses: Normal pulses.     Heart sounds: Murmur heard.  Pulmonary:      Effort: Pulmonary effort is normal.     Breath sounds: Normal breath sounds.  Abdominal:     General: Abdomen is flat. Bowel sounds are normal.     Palpations: Abdomen is soft.  Genitourinary:    Comments: deferred Musculoskeletal:        General: Normal range of motion.     Cervical back: Normal range of motion and neck supple.     Comments: Skin thickening on fingers. No ulceration on fingers. MTP swelling b/l  Feet:     Right foot:     Skin integrity: Callus and dry skin present.     Left foot:  Skin integrity: Callus and dry skin present.  Skin:    General: Skin is warm and dry.     Comments: Scattered erythematous lesions on anterior chest  Neurological:     General: No focal deficit present.     Mental Status: She is alert and oriented to person, place, and time.  Psychiatric:        Mood and Affect: Mood normal.        Behavior: Behavior normal.         Assessment And Plan:     Encounter for annual health examination Assessment & Plan: A full exam was performed.  Importance of monthly self breast exams was discussed with the patient.  She is advised to get 30-45 minutes of regular exercise, no less than four to five days per week. Both weight-bearing and aerobic exercises are recommended.  She is advised to follow a healthy diet with at least six fruits/veggies per day, decrease intake of red meat and other saturated fats and to increase fish intake to twice weekly.  Meats/fish should not be fried -- baked, boiled or broiled is preferable. It is also important to cut back on your sugar intake.  Be sure to read labels - try to avoid anything with added sugar, high fructose corn syrup or other sweeteners.  If you must use a sweetener, you can try stevia or monkfruit.  It is also important to avoid artificially sweetened foods/beverages and diet drinks. Lastly, wear SPF 50 sunscreen on exposed skin and when in direct sunlight for an extended period of time.  Be sure to avoid  fast food restaurants and aim for at least 60 ounces of water daily.       Hypertensive heart disease without heart failure Assessment & Plan: Chronic, well controlled.  She will continue with amlodipine 10mg  daily. She is encouraged to follow low sodium diet. EKG performed, no new changes. She will f/u in four months for re-evaluation.   Orders: -     POCT urinalysis dipstick -     Microalbumin / creatinine urine ratio -     EKG 12-Lead -     AMB Referral to Community Care Coordinaton ([facility] Patients)  Pulmonary hypertension (HCC) Assessment & Plan: Chronic.  Per Cardiology, she has responded well to riociguat. Most recent Cardiology notes reviewed in detail. She does not endorse worsening sob and/or LE edema.   Orders: -     EKG 12-Lead -     AMB Referral to Community Care Coordinaton ([facility] Patients)  Pure hypercholesterolemia Assessment & Plan: Chronic, not on statin therapy. She does not wish to take additional meds.   Orders: -     AMB Referral to Community Care Coordinaton ([facility] Patients)  Scleroderma (HCC) -     AMB Referral to [facility] Coordinaton ([facility] Patients)  Osteopenia of neck of right femur Assessment & Plan: Her last bone density was June 2021.  She was found to have osteopenia in right femoral neck. She agrees to repeat study. Will also check vitamin D level and will supplement as needed. She did participate in the PREP program, admits she did not enjoy it because she did not like cleaning the equipment prior to use. Encouraged to engage in weight-bearing exercises at least three days per week.    Orders: -     VITAMIN D 25 Hydroxy (Vit-D Deficiency, Fractures) -     DG Bone Density; Future  Stress due to family tension Assessment & Plan: She admits  to tension with her sister. She agrees to SW evaluation.   Orders: -     AMB Referral to Community Care Coordinaton ([facility] Patients)  Body mass index (BMI) of 23.0-23.9 in adult Assessment & Plan: She was  congratulated on her 6lb weight gain. She was reminded of need for adequate nutrition for improved immunity during cold/flu/COVID season. She has been using Ensure as meal supplements every other day. Unable to take daily due to the cost. She was given more samples today.    Encounter for screening mammogram for malignant neoplasm of breast -     3D Screening Mammogram, Left and Right; Future     Return for 1 year physical, 4 month bp check. Patient was given opportunity to ask questions. Patient verbalized understanding of the plan and was able to repeat key elements of the plan. All questions were answered to their satisfaction.   I, [name], MD, have reviewed all documentation for this visit. The documentation on 12/03/22 for the exam, diagnosis, procedures, and orders are all accurate and complete.

## 2022-12-03 NOTE — Assessment & Plan Note (Addendum)
Chronic.  Per Cardiology, she has responded well to riociguat. Most recent Cardiology notes reviewed in detail. She does not endorse worsening sob and/or LE edema.

## 2022-12-03 NOTE — Assessment & Plan Note (Addendum)
Her last bone density was June 2021.  She was found to have osteopenia in right femoral neck. She agrees to repeat study. Will also check vitamin D level and will supplement as needed. She did participate in the PREP program, admits she did not enjoy it because she did not like cleaning the equipment prior to use. Encouraged to engage in weight-bearing exercises at least three days per week.

## 2022-12-03 NOTE — Assessment & Plan Note (Signed)

## 2022-12-03 NOTE — Assessment & Plan Note (Addendum)
Chronic, she is followed by Rheumatology. Most recent notes reviewed in Media. Will request more recent visit notes. Pt feels her sx are stable.

## 2022-12-04 ENCOUNTER — Telehealth: Payer: Self-pay | Admitting: *Deleted

## 2022-12-04 ENCOUNTER — Other Ambulatory Visit: Payer: Self-pay | Admitting: Internal Medicine

## 2022-12-04 DIAGNOSIS — R3129 Other microscopic hematuria: Secondary | ICD-10-CM

## 2022-12-04 LAB — MICROALBUMIN / CREATININE URINE RATIO
Creatinine, Urine: 72.8 mg/dL
Microalb/Creat Ratio: 47 mg/g{creat} — ABNORMAL HIGH (ref 0–29)
Microalbumin, Urine: 34.5 ug/mL

## 2022-12-04 LAB — VITAMIN D 25 HYDROXY (VIT D DEFICIENCY, FRACTURES): Vit D, 25-Hydroxy: 13.7 ng/mL — ABNORMAL LOW (ref 30.0–100.0)

## 2022-12-04 NOTE — Progress Notes (Signed)
  Care Coordination  Outreach Note  12/04/2022 Name: Nancy Lambert MRN: [id number] DOB: 1940/09/26   Care Coordination Outreach Attempts: An unsuccessful telephone outreach was attempted today to offer the patient information about available care coordination services.  Follow Up Plan:  Additional outreach attempts will be made to offer the patient care coordination information and services.   Encounter Outcome:  No Answer   Gwenevere Ghazi  Care Coordination Care Guide  Direct Dial: [phone]

## 2022-12-05 NOTE — Progress Notes (Signed)
  Care Coordination   Note   12/05/2022 Name: Nancy Lambert MRN: [id number] DOB: [birth year]  Nancy Lambert is a 82 y.o. year old female who sees Dorothyann Peng, MD for primary care. I reached out to [employer] by phone today to offer care coordination services.  Ms. Panzer was given information about Care Coordination services today including:   The Care Coordination services include support from the care team which includes your Nurse Coordinator, Clinical Social Worker, or Pharmacist.  The Care Coordination team is here to help remove barriers to the health concerns and goals most important to you. Care Coordination services are voluntary, and the patient may decline or stop services at any time by request to their care team member.   Care Coordination Consent Status: Patient agreed to services and verbal consent obtained.   Follow up plan:  Telephone appointment with care coordination team member scheduled for:  12/10/22  Encounter Outcome:  Patient Scheduled  [facility] Coordination Care Guide  Direct Dial: [phone]

## 2022-12-08 ENCOUNTER — Ambulatory Visit: Payer: 59 | Admitting: Cardiology

## 2022-12-08 ENCOUNTER — Encounter: Payer: Self-pay | Admitting: Cardiology

## 2022-12-08 VITALS — BP 130/61 | HR 63 | Resp 16 | Ht 64.0 in | Wt 137.2 lb

## 2022-12-08 DIAGNOSIS — R600 Localized edema: Secondary | ICD-10-CM | POA: Diagnosis not present

## 2022-12-08 DIAGNOSIS — I272 Pulmonary hypertension, unspecified: Secondary | ICD-10-CM

## 2022-12-08 DIAGNOSIS — R0609 Other forms of dyspnea: Secondary | ICD-10-CM | POA: Diagnosis not present

## 2022-12-08 DIAGNOSIS — I10 Essential (primary) hypertension: Secondary | ICD-10-CM | POA: Diagnosis not present

## 2022-12-08 MED ORDER — LOSARTAN POTASSIUM-HCTZ 50-12.5 MG PO TABS
1.0000 | ORAL_TABLET | ORAL | 2 refills | Status: AC
Start: 2022-12-08 — End: ?

## 2022-12-08 MED ORDER — AMLODIPINE BESYLATE 5 MG PO TABS: 5.0000 mg | ORAL_TABLET | Freq: Every day | ORAL | Status: DC

## 2022-12-08 NOTE — Patient Instructions (Signed)
Please decrease amlodipine from 10 mg to 5 mg daily.  Keep your legs elevated above your head level for at least 10 to 15 minutes twice a day.  Wear support stockings, knee-high.  I have added 1 more blood pressure medication and also water pill, losartan HCT, 1 tablet once a day in the morning.  Please go to [facility] in 2 weeks after you start the new medication to get blood work done.

## 2022-12-08 NOTE — Progress Notes (Signed)
Primary Physician/Referring:  Dorothyann Peng, MD  Patient ID: Nancy Lambert, female    DOB: October 02, 1940, 82 y.o.   MRN: [id number]  Chief Complaint  Patient presents with   Pulmonary hypertension    Primary hypertension   Follow-up    6 months   HPI:    Nancy Lambert  is a 82 y.o. 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 She is on Adempas for pulm HTN and has chronic dyspnea, lumbar radiculopathy.   Patient presents for a 62-month office visit, doing well.  Dyspnea has remained stable.  She has noticed bilateral ankle edema over the past 2 to 3 months.  No PND or orthopnea.   No chest pain.  No palpitations.  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HCC)    Pulmonary hypertension (HCC)    Scleroderma (HCC)    Status post ablation of incompetent vein using laser bilateral leg 01/2019   Past Surgical History:  Procedure Laterality Date   CARDIAC CATHETERIZATION N/A 07/24/2015   Procedure: Right Heart Cath;  Surgeon: Yates Decamp, MD;  Location: [facility] INVASIVE CV LAB;  Service: Cardiovascular;  Laterality: N/A;   COLONOSCOPY     steroid shots  2020   for pain   VESICOVAGINAL FISTULA CLOSURE W/ TAH      Social History   Tobacco Use   Smoking status: Former    Current packs/day: 0.00    Average packs/day: 0.3 packs/day for 5.0 years (1.3 ttl pk-yrs)    Types: Cigarettes    Start date: 82    Quit date: 56    Years since quitting: 53.7   Smokeless tobacco: Never   Tobacco comments:    very light use when she did smoke  Substance Use Topics   Alcohol use: Yes    Alcohol/week: 3.0 - 5.0 standard drinks of alcohol    Types: 3 - 5 Standard drinks or equivalent per week    Comment: occasional   Marital Status: Widowed   ROS   Review of Systems  Cardiovascular:  Positive for dyspnea on exertion and leg swelling. Negative for chest pain.   Objective  Blood pressure 130/61, pulse 63, resp. rate 16, height 5\' 4"  (1.626 m), weight 137 lb 3.2 oz (62.2 kg), SpO2 99%.     12/08/2022   10:01 AM 12/03/2022    8:54 AM 09/30/2022   10:35 AM  Vitals with BMI  Height 5\' 4"  5\' 4"  5\' 4"   Weight 137 lbs 3 oz 137 lbs 6 oz 131 lbs 13 oz  BMI 23.54 23.57 22.61  Systolic 130 108 409  Diastolic 61 70 64  Pulse 63  75    Orthostatic VS for the past 72 hrs (Last 3 readings):  Patient Position BP Location Cuff Size  12/08/22 1001 Sitting Left Arm Normal    Physical Exam Vitals reviewed.  Constitutional:      Appearance: She is well-developed.  Neck:     Vascular: No carotid bruit or JVD.  Cardiovascular:     Rate and Rhythm: Normal rate and regular rhythm.     Pulses: Intact distal pulses.          Dorsalis pedis pulses are 2+ on the right side and 2+ on the left side.  Posterior tibial pulses are 0 on the right side and 0 on the left side.     Heart sounds: Murmur heard.     Harsh midsystolic murmur is present with a grade of 2/6 at the upper right sternal border.     No gallop.  Pulmonary:     Effort: Pulmonary effort is normal. No accessory muscle usage.     Breath sounds: Examination of the right-lower field reveals rales. Examination of the left-lower field reveals rales. Rales present.  Abdominal:     General: Bowel sounds are normal.     Palpations: Abdomen is soft.  Musculoskeletal:     Right lower leg: Edema (2+ pitting ankle edema) present.     Left lower leg: Edema (2 + pitting ankle edema) present.  Skin:    Comments: Abnormally dry due to scleroderma     Laboratory examination:   Lab Results  Component Value Date   NA 143 09/30/2022   K 4.0 09/30/2022   CO2 23 09/30/2022   GLUCOSE 89 09/30/2022   BUN 14 09/30/2022   CREATININE 0.75 09/30/2022   CALCIUM 9.7 09/30/2022   EGFR 81 09/30/2022    GFRNONAA 72 11/09/2019    CrCl cannot be calculated (Patient's most recent lab result is older than the maximum 21 days allowed.).      Latest Ref Rng & Units 09/30/2022   11:33 AM 05/26/2022   11:16 AM 11/21/2021   11:00 AM  CMP  Glucose 70 - 99 mg/dL 89  86  86   BUN 8 - 27 mg/dL 14  9  14    Creatinine 0.57 - 1.00 mg/dL 2.95  6.21  3.08   Sodium 134 - 144 mmol/L 143  143  143   Potassium 3.5 - 5.2 mmol/L 4.0  3.7  4.0   Chloride 96 - 106 mmol/L 103  102  103   CO2 20 - 29 mmol/L 23  24  24    Calcium 8.7 - 10.3 mg/dL 9.7  9.9  65.7   Total Protein 6.0 - 8.5 g/dL 6.9  7.2  7.2   Total Bilirubin 0.0 - 1.2 mg/dL 0.6  0.5  0.6   Alkaline Phos 44 - 121 IU/L 76  74  71   AST 0 - 40 IU/L 24  25  33   ALT 0 - 32 IU/L 16  17  18        Latest Ref Rng & Units 09/30/2022   11:33 AM 11/21/2021   11:00 AM 06/12/2021   10:27 AM  CBC  WBC 3.4 - 10.8 x10E3/uL 3.8  3.3  4.2   Hemoglobin 11.1 - 15.9 g/dL 84.6  96.2  95.2   Hematocrit 34.0 - 46.6 % 37.0  38.5  37.2   Platelets 150 - 450 x10E3/uL 316  316  516     Lipid Panel    Component Value Date/Time   CHOL 178 09/30/2022 1133   TRIG 66 09/30/2022 1133   HDL 67 09/30/2022 1133   CHOLHDL 2.7 09/30/2022 1133   LDLCALC 98 09/30/2022 1133    TSH Recent Labs    09/30/22 1133  TSH 2.450   Allergies  No Known Allergies   Final Medications at End of Visit     Current Outpatient Medications:    aspirin EC 81 MG tablet, Take 81 mg by mouth daily., Disp: , Rfl:    losartan-hydrochlorothiazide (HYZAAR) 50-12.5 MG tablet, Take 1 tablet by mouth every morning., Disp: 30 tablet, Rfl: 2  mirtazapine (REMERON) 7.5 MG tablet, Take 1 tablet (7.5 mg total) by mouth at bedtime., Disp: 30 tablet, Rfl: 1   omeprazole (PRILOSEC) 40 MG capsule, Take 1 capsule (40 mg total) by mouth in the morning., Disp: 90 capsule, Rfl: 4   Riociguat (ADEMPAS) 2 MG TABS, Take 1 tablet by mouth 3 (three) times daily., Disp: 252 tablet, Rfl: 0   amLODipine  (NORVASC) 5 MG tablet, Take 1 tablet (5 mg total) by mouth daily., Disp: , Rfl:   Radiology:   No results found.  Cardiac Studies:   Lexiscan myoview stress test 06/18/2015: 1. Resting EKG demonstrates normal sinus rhythm, left axis deviation, cannot exclude inferior infarct old, Anterior infarct old.  Stress EKG was negative for myocardial ischemia.  Patient exercised for 4 minutes and 5 seconds and achieved 5.93 Mets.  There are frequent PVCs in the recovery phase of the stress test.  Stress terminated due to achieving target heart rate, 86% of MPHR.  Stress symptoms included dyspnea. 2. Myocardial perfusion imaging is normal. Overall left ventricular systolic function was normal without regional wall motion abnormalities. The left ventricular ejection fraction was 62%.  Right heart cath 07/24/2015: Right Heart Pressure:  RA A Wave  13 mmHg,   RA V Wave  9 mmHg,   RA Mean  8 mmHg  RV Systolic Pressure  48 mmHg,   RV Diastolic Pressure  4 mmHg, RV EDP  11 mmHg  PA Systolic Pressure  48 mmHg,   PA Diastolic Pressure  14 mmHg,   PA Mean  29 mmHg. PW A Wave  19 mmHg,   PW V Wave  17 mmHg,   PW Mean  15 mmHg  QP/QS  1    Total pulmonary vascular resistance 7 Wood units.   Impression: Mild to moderate pulmonary hypertension. Wedge pressure although normal, in the upper end of spectrum suggesting the pulmonary hypertension could also be related to mild LV diastolic heart failure. Findings probably more consistent with primary pulmonary hypertension. Clinical correlation recommended.   Lower Venous DVT Study 07/14/2019: BILATERAL:  - No evidence of deep vein thrombosis seen in the lower extremities, bilaterally.   PCV ECHOCARDIOGRAM COMPLETE 05/28/2022  Narrative Echocardiogram 05/28/2022: Normal LV systolic function with visual EF 60-65%. Left ventricle cavity is normal in size.  Moderate concentric hypertrophy of the left ventricle. Normal global wall motion. Indeterminate diastolic filling  pattern, normal LAP. Left atrial cavity is moderately dilated at 43.77 ml/m^2. Right atrial cavity is mildly dilated. Mild tricuspid regurgitation. Mild pulmonary hypertension. RVSP measures 38 mmHg. Small pericardial effusion, predominantly located posteriorly.  There is no hemodynamic significance. Compared to 08/13/2021 mild/moderate MR is not seen, mild PHTN is new, moderate pericardial effusion is now mild, otherwise no significant change.   EKG   EKG 12/08/2022: Normal sinus rhythm at the rate of 65 bpm, right atrial enlargement, left anterior fascicular block.  IVCD, LVH.  Poor R progression, cannot exclude anterolateral infarct old.  T wave abnormality, cannot exclude high lateral ischemia.  PACs, bigeminal pattern versus grouped beating.  Compared to 12/03/2022, atrial bigeminy not present.  Assessment     ICD-10-CM   1. Pulmonary hypertension (HCC)  I27.20 EKG 12-Lead    Basic metabolic panel    Pro b natriuretic peptide (BNP)    ECHOCARDIOGRAM COMPLETE    DG Chest 2 View    2. Primary hypertension  I10 amLODipine (NORVASC) 5 MG tablet    losartan-hydrochlorothiazide (HYZAAR) 50-12.5 MG tablet    3. Dyspnea on  exertion  R06.09 ECHOCARDIOGRAM COMPLETE    DG Chest 2 View    4. Bilateral leg edema  R60.0       Meds ordered this encounter  Medications   amLODipine (NORVASC) 5 MG tablet    Sig: Take 1 tablet (5 mg total) by mouth daily.   losartan-hydrochlorothiazide (HYZAAR) 50-12.5 MG tablet    Sig: Take 1 tablet by mouth every morning.    Dispense:  30 tablet    Refill:  2    Change amlodipine to 5 mg from 10 mg leg swelling   Medications Discontinued During This Encounter  Medication Reason   amLODipine (NORVASC) 10 MG tablet     Recommendations:   Nancy Lambert  is a 82 y.o. 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 She is on Adempas for pulm  HTN and has chronic dyspnea, lumbar radiculopathy.  1. Pulmonary hypertension (HCC) Patient with primary pulm hypertension and systemic sclerosis/scleroderma, presently on Adempas for pulm hypertension, dyspnea has remained stable.  However on physical exam today, I clearly hear very coarse rhonchi bilateral bases and I am concerned about potential for pulmonary fibrosis.  Will obtain a chest x-ray.  She denies any worsening dyspnea although she has noticed bilateral ankle edema for the past several months. She continues to remain active.  - EKG 12-Lead - Basic metabolic panel - Pro b natriuretic peptide (BNP) - ECHOCARDIOGRAM COMPLETE; Future  2. Primary hypertension I have decreased the dose of amlodipine from 10 mg to 5 mg, to see whether her leg edema would improve and added losartan HCT for systemic hypertension at 50/12.5 mg in the morning, obtain BMP and NT proBNP in 2 weeks after starting the medication.  - amLODipine (NORVASC) 5 MG tablet; Take 1 tablet (5 mg total) by mouth daily. - losartan-hydrochlorothiazide (HYZAAR) 50-12.5 MG tablet; Take 1 tablet by mouth every morning.  Dispense: 30 tablet; Refill: 2  3. Dyspnea on exertion A chest x-ray PA and lateral view has been ordered to exclude pulmonary fibrosis, he had a rhonchi bilateral bases this is new finding.  She may need referral to pulmonary medicine to exclude IPF.  Patient advised to contact me if she notices worsening dyspnea or leg edema is not improving.  - ECHOCARDIOGRAM COMPLETE; Future  4. Bilateral leg edema As dictated above, I have decreased amlodipine to 5 mg and added losartan HCT 50/12.5 mg in the morning.  I will see her back in 6 months with repeat echocardiogram.  So far the pulmonary hypertension appears to be stable on single agent and she just turned 82 years of age.     Yates Decamp, MD, [facility] 12/08/2022, 10:32 AM Office: [phone] Fax: [phone] Pager: [phone]

## 2022-12-10 ENCOUNTER — Ambulatory Visit: Payer: Self-pay | Admitting: Licensed Clinical Social Worker

## 2022-12-10 DIAGNOSIS — R600 Localized edema: Secondary | ICD-10-CM | POA: Diagnosis not present

## 2022-12-10 DIAGNOSIS — I272 Pulmonary hypertension, unspecified: Secondary | ICD-10-CM | POA: Diagnosis not present

## 2022-12-11 ENCOUNTER — Telehealth: Payer: Self-pay | Admitting: Licensed Clinical Social Worker

## 2022-12-11 LAB — BASIC METABOLIC PANEL
BUN/Creatinine Ratio: 19 (ref 12–28)
BUN: 13 mg/dL (ref 8–27)
CO2: 25 mmol/L (ref 20–29)
Calcium: 9.4 mg/dL (ref 8.7–10.3)
Chloride: 106 mmol/L (ref 96–106)
Creatinine, Ser: 0.67 mg/dL (ref 0.57–1.00)
Glucose: 84 mg/dL (ref 70–99)
Potassium: 3.6 mmol/L (ref 3.5–5.2)
Sodium: 145 mmol/L — ABNORMAL HIGH (ref 134–144)
eGFR: 88 mL/min/{1.73_m2} (ref >59)

## 2022-12-11 LAB — PRO B NATRIURETIC PEPTIDE: NT-Pro BNP: 225 pg/mL (ref 0–738)

## 2022-12-11 NOTE — Patient Outreach (Signed)
  Care Coordination   Follow Up Visit Note   12/11/2022 Name: Nancy Lambert MRN: [id number] DOB: February 03, 1941  Nancy Lambert is a 82 y.o. year old female who sees Dorothyann Peng, MD for primary care. I  mailed supportive resources   What matters to the patients health and wellness today?  Transportation    Goals Addressed             This Visit's Progress    LCSW-Management of MH Symptoms   On track    Activities and task to complete in order to accomplish goals.   Keep all upcoming appointments discussed today Continue with compliance of taking medication prescribed by Doctor Implement healthy coping skills discussed to assist with management of symptoms Review supportive resources on [facility] [place] (SCAT)         SDOH assessments and interventions completed:  Yes  SDOH Interventions Today    Flowsheet Row Most Recent Value  SDOH Interventions   Transportation Interventions Patient Resources (Friends/Family), SCAT ([employer])        Care Coordination Interventions:  Yes, provided  Interventions Today    Flowsheet Row Most Recent Value  Chronic Disease   Chronic disease during today's visit Hypertension (HTN)  General Interventions   General Interventions Discussed/Reviewed [employer] provided supportive information on [employer] [place] (SCAT) to assist with transportation needs]       Follow up plan: Follow up call scheduled for 2-4 weeks    Encounter Outcome:  Patient Visit Completed   [name], MSW, LCSW [facility] Care Management [facility]  [facility] [email or web address] Phone [phone] 7:18 PM

## 2022-12-11 NOTE — Patient Instructions (Signed)
Visit Information  Thank you for taking time to visit with me today. Please don't hesitate to contact me if I can be of assistance to you.   Following are the goals we discussed today:   Goals Addressed             This Visit's Progress    LCSW-Management of MH Symptoms   On track    Activities and task to complete in order to accomplish goals.   Keep all upcoming appointments discussed today Continue with compliance of taking medication prescribed by Doctor Implement healthy coping skills discussed to assist with management of symptoms Review supportive resources on [facility] [place] (SCAT)         Our next appointment is by telephone on 09/26 at 1 PM  Please call the care guide team at [phone] if you need to cancel or reschedule your appointment.   If you are experiencing a Mental Health or Behavioral Health Crisis or need someone to talk to, please call the Suicide and Crisis Lifeline: 988 call 911   Patient verbalizes understanding of instructions and care plan provided today and agrees to view in MyChart. Active MyChart status and patient understanding of how to access instructions and care plan via MyChart confirmed with patient.     [name], MSW, LCSW [facility] Care Management [place]  [facility] [email or web address] Phone [phone] 7:20 PM

## 2022-12-11 NOTE — Patient Instructions (Signed)
Visit Information  Thank you for taking time to visit with me today. Please don't hesitate to contact me if I can be of assistance to you.   Following are the goals we discussed today:   Goals Addressed             This Visit's Progress    LCSW-Management of MH Symptoms   On track    Activities and task to complete in order to accomplish goals.   Keep all upcoming appointments discussed today Continue with compliance of taking medication prescribed by Doctor Implement healthy coping skills discussed to assist with management of symptoms         Our next appointment is by telephone on 09/26 at 1 PM  Please call the care guide team at [phone] if you need to cancel or reschedule your appointment.   If you are experiencing a Mental Health or Behavioral Health Crisis or need someone to talk to, please call the Suicide and Crisis Lifeline: 988 call 911   Patient verbalizes understanding of instructions and care plan provided today and agrees to view in MyChart. Active MyChart status and patient understanding of how to access instructions and care plan via MyChart confirmed with patient.     [name], MSW, LCSW [facility] Care Management [place]  [facility] [email or web address] Phone [phone] 12:03 PM

## 2022-12-11 NOTE — Patient Outreach (Signed)
  Care Coordination   Initial Visit Note   12/10/2022 Name: Nancy Lambert MRN: [id number] DOB: 1940-04-22  Nancy Lambert is a 82 y.o. year old female who sees Dorothyann Peng, MD for primary care. I spoke with  [name] by phone today.  What matters to the patients health and wellness today?  Symptom Management and Supportive Resources    Goals Addressed             This Visit's Progress    LCSW-Management of MH Symptoms   On track    Activities and task to complete in order to accomplish goals.   Keep all upcoming appointments discussed today Continue with compliance of taking medication prescribed by Doctor Implement healthy coping skills discussed to assist with management of symptoms         SDOH assessments and interventions completed:  Yes  SDOH Interventions Today    Flowsheet Row Most Recent Value  SDOH Interventions   Housing Interventions Intervention Not Indicated  Utilities Interventions Intervention Not Indicated        Care Coordination Interventions:  Yes, provided  Interventions Today    Flowsheet Row Most Recent Value  Chronic Disease   Chronic disease during today's visit Hypertension (HTN), Other  [Stress due to family tension]  General Interventions   General Interventions Discussed/Reviewed General Interventions Discussed, Doctor Visits, Community Resources  [Informed pt of SCAT]  Doctor Visits Discussed/Reviewed Doctor Visits Discussed  Mental Health Interventions   Mental Health Discussed/Reviewed Mental Health Discussed, Coping Strategies, Anxiety, Depression, Grief and Loss  Nutrition Interventions   Nutrition Discussed/Reviewed Nutrition Discussed  Pharmacy Interventions   Pharmacy Dicussed/Reviewed Pharmacy Topics Discussed, Medication Adherence  Safety Interventions   Safety Discussed/Reviewed Safety Discussed, Home Safety, Fall Risk       Follow up plan: Follow up call scheduled for 1-2 weeks     Encounter Outcome:  Patient Visit Completed   [name], MSW, LCSW [facility] Care Management [facility]  [facility] [email or web address] Phone [phone] 12:02 PM

## 2022-12-12 ENCOUNTER — Ambulatory Visit
Admission: RE | Admit: 2022-12-12 | Discharge: 2022-12-12 | Disposition: A | Discharge status: Home / Self Care | Payer: 59 | Source: Ambulatory Visit | Attending: Internal Medicine | Admitting: Internal Medicine

## 2022-12-12 DIAGNOSIS — R3129 Other microscopic hematuria: Secondary | ICD-10-CM

## 2022-12-12 DIAGNOSIS — R319 Hematuria, unspecified: Secondary | ICD-10-CM | POA: Diagnosis not present

## 2022-12-25 ENCOUNTER — Ambulatory Visit: Payer: Self-pay | Admitting: Licensed Clinical Social Worker

## 2022-12-26 NOTE — Patient Instructions (Signed)
Visit Information  Thank you for taking time to visit with me today. Please don't hesitate to contact me if I can be of assistance to you.   Following are the goals we discussed today:   Goals Addressed             This Visit's Progress    LCSW-Management of MH Symptoms   On track    Activities and task to complete in order to accomplish goals.   Keep all upcoming appointments discussed today Continue with compliance of taking medication prescribed by Doctor Implement healthy coping skills discussed to assist with management of symptoms Review supportive resources on [facility] [place] (SCAT)         Our next appointment is by telephone on 10/10 at 3:30 pm  Please call the care guide team at [phone] if you need to cancel or reschedule your appointment.   If you are experiencing a Mental Health or Behavioral Health Crisis or need someone to talk to, please call the Suicide and Crisis Lifeline: 988 call 911   Patient verbalizes understanding of instructions and care plan provided today and agrees to view in MyChart. Active MyChart status and patient understanding of how to access instructions and care plan via MyChart confirmed with patient.     [name], MSW, LCSW [facility] Care Management [place]  [facility] [email or web address] Phone [phone] 7:49 AM

## 2022-12-26 NOTE — Patient Outreach (Signed)
  Care Coordination   Follow Up Visit Note   12/25/2022 Name: Nancy Lambert MRN: [id number] DOB: 1940/10/11  Nancy Lambert is a 82 y.o. year old female who sees Dorothyann Peng, MD for primary care. I spoke with  [name] by phone today.  What matters to the patients health and wellness today?  Symptom Management    Goals Addressed             This Visit's Progress    LCSW-Management of MH Symptoms   On track    Activities and task to complete in order to accomplish goals.   Keep all upcoming appointments discussed today Continue with compliance of taking medication prescribed by Doctor Implement healthy coping skills discussed to assist with management of symptoms Review supportive resources on [facility] [place] (SCAT)         SDOH assessments and interventions completed:  No     Care Coordination Interventions:  Yes, provided  Interventions Today    Flowsheet Row Most Recent Value  Chronic Disease   Chronic disease during today's visit Hypertension (HTN)  General Interventions   General Interventions Discussed/Reviewed General Interventions Reviewed, Doctor Visits  [Had an Ultrasound about a week ago, waiting for results.]  Doctor Visits Discussed/Reviewed PCP, Doctor Visits Reviewed  Mental Health Interventions   Mental Health Discussed/Reviewed Mental Health Reviewed, Coping Strategies  [Discussed strategies to strenghten respect from family and prioritizing self-care and rest]  Nutrition Interventions   Nutrition Discussed/Reviewed Nutrition Reviewed  Pharmacy Interventions   Pharmacy Dicussed/Reviewed Pharmacy Topics Reviewed, Medication Adherence  Safety Interventions   Safety Discussed/Reviewed Safety Reviewed       Follow up plan: Follow up call scheduled for 2-4 weeks    Encounter Outcome:  Patient Visit Completed   [name], MSW, LCSW [facility] Care Management [facility]  [facility] [email or web address] Phone [phone] 7:47 AM

## 2022-12-29 ENCOUNTER — Encounter: Payer: Self-pay | Admitting: Internal Medicine

## 2022-12-30 ENCOUNTER — Other Ambulatory Visit: Payer: Self-pay

## 2022-12-30 MED ORDER — ADEMPAS 2 MG PO TABS: 1.0000 | ORAL_TABLET | Freq: Three times a day (TID) | ORAL | 3 refills | Status: DC

## 2023-01-01 ENCOUNTER — Ambulatory Visit: Payer: 59

## 2023-01-01 VITALS — BP 108/58 | HR 75 | Temp 98.1°F | Ht 64.6 in | Wt 133.4 lb

## 2023-01-01 DIAGNOSIS — Z0000 Encounter for general adult medical examination without abnormal findings: Secondary | ICD-10-CM

## 2023-01-01 NOTE — Patient Instructions (Signed)
[name] , Thank you for taking time to come for your Medicare Wellness Visit. I appreciate your ongoing commitment to your health goals. Please review the following plan we discussed and let me know if I can assist you in the future.   Referrals/Orders/Follow-Ups/Clinician Recommendations: none  This is a list of the screening recommended for you and due dates:  Health Maintenance  Topic Date Due   COVID-19 Vaccine (5 - 2023-24 season) 11/30/2022   Zoster (Shingles) Vaccine (1 of 2) 04/03/2023*   Flu Shot  06/29/2023*   Colon Cancer Screening  08/15/2023   Medicare Annual Wellness Visit  01/01/2024   Pneumonia Vaccine  Completed   DEXA scan (bone density measurement)  Completed   HPV Vaccine  Aged Out   DTaP/Tdap/Td vaccine  Discontinued   Hepatitis C Screening  Discontinued  *Topic was postponed. The date shown is not the original due date.    Advanced directives: (Declined) Advance directive discussed with you today. Even though you declined this today, please call our office should you change your mind, and we can give you the proper paperwork for you to fill out.  Next Medicare Annual Wellness Visit scheduled for next year: No, office will schedule appointment  Insert Preventive Care attachment Insert FALL PREVENTION attachment if needed

## 2023-01-01 NOTE — Progress Notes (Signed)
Subjective:   Nancy Lambert is a 82 y.o. female who presents for Medicare Annual (Subsequent) preventive examination.  Visit Complete: In person    Cardiac Risk Factors include: advanced age (>59men, >34 women);hypertension     Objective:    Today's Vitals   01/01/23 1544  BP: (!) 108/58  Pulse: 75  Temp: 98.1 F (36.7 C)  TempSrc: Oral  SpO2: 92%  Weight: 133 lb 6.4 oz (60.5 kg)  Height: 5' 4.6" (1.641 m)   Body mass index is 22.47 kg/m.     01/01/2023    3:53 PM 12/25/2021   12:40 PM 07/23/2021    9:47 PM 12/13/2020   10:10 AM 11/09/2019    2:23 PM 03/30/2019    4:34 PM 09/07/2018   11:14 AM  Advanced Directives  Does Patient Have a Medical Advance Directive? No No No No No No No  Would patient like information on creating a medical advance directive? No - Patient declined    No - Patient declined No - Patient declined No - Patient declined    Current Medications (verified) Outpatient Encounter Medications as of 01/01/2023  Medication Sig   amLODipine (NORVASC) 5 MG tablet Take 1 tablet (5 mg total) by mouth daily.   aspirin EC 81 MG tablet Take 81 mg by mouth daily.   losartan-hydrochlorothiazide (HYZAAR) 50-12.5 MG tablet Take 1 tablet by mouth every morning.   mirtazapine (REMERON) 7.5 MG tablet Take 1 tablet (7.5 mg total) by mouth at bedtime.   omeprazole (PRILOSEC) 40 MG capsule Take 1 capsule (40 mg total) by mouth in the morning.   Riociguat (ADEMPAS) 2 MG TABS Take 1 tablet by mouth 3 (three) times daily.   No facility-administered encounter medications on file as of 01/01/2023.    Allergies (verified) Patient has no known allergies.   History: 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HCC)    Pulmonary hypertension (HCC)    Scleroderma (HCC)    Status post ablation of incompetent vein using laser bilateral leg 01/2019    Past Surgical History:  Procedure Laterality Date   CARDIAC CATHETERIZATION N/A 07/24/2015   Procedure: Right Heart Cath;  Surgeon: Yates Decamp, MD;  Location[facility] INVASIVE CV LAB;  Service: Cardiovascular;  Laterality: N/A;   COLONOSCOPY     steroid shots  2020   for pain   VESICOVAGINAL FISTULA CLOSURE W/ TAH     Family History  Problem Relation Age of Onset   Breast cancer Mother    Clotting disorder Mother    Diabetes Mother    Other Father        drowned   Breast cancer Sister    Clotting disorder Sister    Breast cancer Sister    Breast cancer Daughter    Prostate cancer Son    Breast cancer Other    Colon cancer Neg Hx    Colon polyps Neg Hx    Esophageal cancer Neg Hx    Rectal cancer Neg Hx    Stomach cancer Neg Hx    Social History   Socioeconomic History   Marital status: Widowed    Spouse name: Not on file   Number of children: 4   Years of education: Not on file   Highest education level: Not on file  Occupational History   Occupation: Retired  Tobacco Use  Smoking status: Former    Current packs/day: 0.00    Average packs/day: 0.3 packs/day for 5.0 years (1.3 ttl pk-yrs)    Types: Cigarettes    Start date: 76    Quit date: 1971    Years since quitting: 53.7   Smokeless tobacco: Never   Tobacco comments:    very light use when she did smoke  Vaping Use   Vaping status: Never Used  Substance and Sexual Activity   Alcohol use: Yes    Alcohol/week: 3.0 - 5.0 standard drinks of alcohol    Types: 3 - 5 Standard drinks or equivalent per week    Comment: occasional   Drug use: No   Sexual activity: Not Currently  Other Topics Concern   Not on file  Social History Narrative   Lives at home with her daughter   Right handed   Social Determinants of Health   Financial Resource Strain: Low Risk  (01/01/2023)   Overall Financial Resource Strain (CARDIA)    Difficulty of Paying Living Expenses: Not hard at all  Food Insecurity: No Food Insecurity  (01/01/2023)   Hunger Vital Sign    Worried About Running Out of Food in the Last Year: Never true    Ran Out of Food in the Last Year: Never true  Transportation Needs: No Transportation Needs (01/01/2023)   PRAPARE - Administrator, Civil Service (Medical): No    Lack of Transportation (Non-Medical): No  Recent Concern: Transportation Needs - Unmet Transportation Needs (12/10/2022)   PRAPARE - Administrator, Civil Service (Medical): Yes    Lack of Transportation (Non-Medical): No  Physical Activity: Insufficiently Active (01/01/2023)   Exercise Vital Sign    Days of Exercise per Week: 7 days    Minutes of Exercise per Session: 20 min  Stress: No Stress Concern Present (01/01/2023)   [employer] of Occupational Health - Occupational Stress Questionnaire    Feeling of Stress : Only a little  Social Connections: Socially Isolated (01/01/2023)   Social Connection and Isolation Panel [NHANES]    Frequency of Communication with Friends and Family: More than three times a week    Frequency of Social Gatherings with Friends and Family: Once a week    Attends Religious Services: Never    Database administrator or Organizations: No    Attends Banker Meetings: Never    Marital Status: Widowed    Tobacco Counseling Counseling given: Not Answered Tobacco comments: very light use when she did smoke   Clinical Intake:  Pre-visit preparation completed: Yes  Pain : No/denies pain     Nutritional Risks: None Diabetes: No  How often do you need to have someone help you when you read instructions, pamphlets, or other written materials from your doctor or pharmacy?: 1 - Never  Interpreter Needed?: No  Information entered by :: NAllen LPN   Activities of Daily Living    01/01/2023    3:46 PM  In your present state of health, do you have any difficulty performing the following activities:  Hearing? 0  Vision? 0  Difficulty concentrating or  making decisions? 0  Walking or climbing stairs? 0  Dressing or bathing? 0  Doing errands, shopping? 0  Preparing Food and eating ? N  Using the Toilet? N  In the past six months, have you accidently leaked urine? N  Do you have problems with loss of bowel control? N  Managing your Medications? N  Managing your Finances? N  Housekeeping or managing your Housekeeping? N    Patient Care Team: Dorothyann Peng, MD as PCP - General (Internal Medicine) [facility]  Indicate any recent Medical Services you may have received from other than [facility] providers in the past year (date may be approximate).     Assessment:   This is a routine wellness examination for [place].  Hearing/Vision screen Hearing Screening - Comments:: Denies hearing issues Vision Screening - Comments:: Regular eye exams, [facility]   Goals Addressed             This Visit's Progress    Patient Stated       01/01/2023, keep mind clear       Depression Screen    01/01/2023    3:56 PM 12/03/2022    9:12 AM 05/26/2022   10:14 AM 12/25/2021   12:44 PM 08/06/2021    3:58 PM 08/06/2021    3:56 PM 12/13/2020   10:14 AM  PHQ 2/9 Scores  PHQ - 2 Score 0 4 0 5 0 0 0  PHQ- 9 Score 0 5  6       Fall Risk    01/01/2023    3:55 PM 12/03/2022    9:12 AM 05/26/2022   10:14 AM 12/25/2021   12:42 PM 11/24/2021   10:46 PM  Fall Risk   Falls in the past year? 0 0 0 1 1  Comment    has callouses on feet that hurt trouble walking   Number falls in past yr: 0 0 0 1 1  Injury with Fall? 0 0 0 0 0  Risk for fall due to : Medication side effect No Fall Risks No Fall Risks Medication side effect;History of fall(s) History of fall(s);Impaired balance/gait  Follow up Falls prevention discussed;Falls evaluation completed Falls evaluation completed Falls evaluation completed Falls evaluation completed;Education provided;Falls prevention discussed Falls evaluation completed;Falls prevention discussed  Comment      refer to [facility] PT    MEDICARE RISK AT HOME: Medicare Risk at Home Any stairs in or around the home?: Yes If so, are there any without handrails?: No Home free of loose throw rugs in walkways, pet beds, electrical cords, etc?: Yes Adequate lighting in your home to reduce risk of falls?: Yes Life alert?: No Use of a cane, walker or w/c?: No Grab bars in the bathroom?: No Shower chair or bench in shower?: No Elevated toilet seat or a handicapped toilet?: Yes  TIMED UP AND GO:  Was the test performed?  Yes  Length of time to ambulate 10 feet: 5 sec Gait steady and fast without use of assistive device    Cognitive Function:        01/01/2023    3:59 PM 12/25/2021   12:56 PM 12/13/2020   10:17 AM 11/09/2019    2:27 PM 09/07/2018   11:20 AM  6CIT Screen  What Year? 0 points 0 points 0 points 0 points 0 points  What month? 0 points 0 points 0 points 0 points 0 points  What time? 0 points 0 points 0 points 0 points 0 points  Count back from 20 2 points 0 points 0 points 2 points 0 points  Months in reverse 4 points 4 points 4 points 4 points 4 points  Repeat phrase 2 points 2 points 0 points 2 points 0 points  Total Score 8 points 6 points 4 points 8 points 4 points    Immunizations Immunization  History  Administered Date(s) Administered   Influenza Split 12/30/2014   Influenza, High Dose Seasonal PF 01/28/2018   Influenza,inj,Quad PF,6+ Mos 01/30/2016   Influenza-Unspecified 01/29/2013   PFIZER(Purple Top)SARS-COV-2 Vaccination 06/12/2019, 07/04/2019, 01/21/2020, 01/21/2021   PNEUMOCOCCAL CONJUGATE-20 11/21/2021   Pneumococcal Conjugate-13 05/09/2015    TDAP status: Up to date  Flu Vaccine status: Declined, Education has been provided regarding the importance of this vaccine but patient still declined. Advised may receive this vaccine at local pharmacy or Health Dept. Aware to provide a copy of the vaccination record if obtained from local pharmacy or Health Dept. Verbalized  acceptance and understanding.  Pneumococcal vaccine status: Up to date  Covid-19 vaccine status: Information provided on how to obtain vaccines.   Qualifies for Shingles Vaccine? Yes   Zostavax completed No   Shingrix Completed?: No.    Education has been provided regarding the importance of this vaccine. Patient has been advised to call insurance company to determine out of pocket expense if they have not yet received this vaccine. Advised may also receive vaccine at local pharmacy or Health Dept. Verbalized acceptance and understanding.  Screening Tests Health Maintenance  Topic Date Due   COVID-19 Vaccine (5 - 2023-24 season) 11/30/2022   Zoster Vaccines- Shingrix (1 of 2) 04/03/2023 (Originally 11/19/1959)   INFLUENZA VACCINE  06/29/2023 (Originally 10/30/2022)   Colonoscopy  08/15/2023   Medicare Annual Wellness (AWV)  01/01/2024   Pneumonia Vaccine 7+ Years old  Completed   DEXA SCAN  Completed   HPV VACCINES  Aged Out   DTaP/Tdap/Td  Discontinued   Hepatitis C Screening  Discontinued    Health Maintenance  Health Maintenance Due  Topic Date Due   COVID-19 Vaccine (5 - 2023-24 season) 11/30/2022    Colorectal cancer screening: No longer required.   Mammogram status: Ordered 12/13/2022. Pt provided with contact info and advised to call to schedule appt.   Bone Density status: Ordered 12/03/2022. Pt provided with contact info and advised to call to schedule appt.  Lung Cancer Screening: (Low Dose CT Chest recommended if Age 56-80 years, 20 pack-year currently smoking OR have quit w/in 15years.) does not qualify.   Lung Cancer Screening Referral: no  Additional Screening:  Hepatitis C Screening: does not qualify;   Vision Screening: Recommended annual ophthalmology exams for early detection of glaucoma and other disorders of the eye. Is the patient up to date with their annual eye exam?  Yes  Who is the provider or what is the name of the office in which the patient  attends annual eye exams? [facility] If pt is not established with a provider, would they like to be referred to a provider to establish care? No .   Dental Screening: Recommended annual dental exams for proper oral hygiene  Diabetic Foot Exam: n/a  Community Resource Referral / Chronic Care Management: CRR required this visit?  No   CCM required this visit?  No     Plan:     I have personally reviewed and noted the following in the patient's chart:   Medical and social history Use of alcohol, tobacco or illicit drugs  Current medications and supplements including opioid prescriptions. Patient is not currently taking opioid prescriptions. Functional ability and status Nutritional status Physical activity Advanced directives List of other physicians Hospitalizations, surgeries, and ER visits in previous 12 months Vitals Screenings to include cognitive, depression, and falls Referrals and appointments  In addition, I have reviewed and discussed with patient certain preventive protocols,  quality metrics, and best practice recommendations. A written personalized care plan for preventive services as well as general preventive health recommendations were provided to patient.     Barb Merino, LPN   41/05/2438   After Visit Summary: (In Person-Printed) AVS printed and given to the patient  Nurse Notes: none

## 2023-01-08 ENCOUNTER — Ambulatory Visit: Payer: Self-pay | Admitting: Licensed Clinical Social Worker

## 2023-01-09 NOTE — Patient Outreach (Signed)
  Care Coordination   Follow Up Visit Note   01/08/2023 Name: Nancy Lambert MRN: [id number] DOB: 03-10-1941  Nancy Lambert is a 82 y.o. year old female who sees Dorothyann Peng, MD for primary care. I spoke with  [name] by phone today.  What matters to the patients health and wellness today?  Transportation    Goals Addressed             This Visit's Progress    LCSW-Management of MH Symptoms   On track    Activities and task to complete in order to accomplish goals.   Keep all upcoming appointments discussed today Continue with compliance of taking medication prescribed by Doctor Implement healthy coping skills discussed to assist with management of symptoms Review supportive resources on [facility] [place] (SCAT)         SDOH assessments and interventions completed:  No     Care Coordination Interventions:  Yes, provided  Interventions Today    Flowsheet Row Most Recent Value  Chronic Disease   Chronic disease during today's visit Hypertension (HTN)  General Interventions   General Interventions Discussed/Reviewed General Interventions Reviewed, Doctor Visits  [Patient reports that she spoke with daughter, who has decided to continue taking her to medical appts. Will think about applying for [facility] [place] to foster independence, in the future]  Doctor Visits Discussed/Reviewed Doctor Visits Reviewed  Mental Health Interventions   Mental Health Discussed/Reviewed Mental Health Reviewed, Coping Strategies  Nutrition Interventions   Nutrition Discussed/Reviewed Nutrition Reviewed  Pharmacy Interventions   Pharmacy Dicussed/Reviewed Pharmacy Topics Reviewed, Medication Adherence  Safety Interventions   Safety Discussed/Reviewed Safety Reviewed       Follow up plan: Follow up call scheduled for 4-6 weeks    Encounter Outcome:  Patient Visit Completed   [name], MSW, LCSW [facility] Care Management [facility]  [facility] [email or web address] Phone [phone] 6:17 AM

## 2023-01-09 NOTE — Patient Instructions (Signed)
Visit Information  Thank you for taking time to visit with me today. Please don't hesitate to contact me if I can be of assistance to you.   Following are the goals we discussed today:   Goals Addressed             This Visit's Progress    LCSW-Management of MH Symptoms   On track    Activities and task to complete in order to accomplish goals.   Keep all upcoming appointments discussed today Continue with compliance of taking medication prescribed by Doctor Implement healthy coping skills discussed to assist with management of symptoms Review supportive resources on [facility] [place] (SC[facility])         Our next appointment is by telephone on 11/14 at 1:30 PM  Please call the care guide team at [phone] if you need to cancel or reschedule your appointment.   If you are experiencing a Mental Health or Behavioral Health Crisis or need someone to talk to, please call the Suicide and Crisis Lifeline: 988 call 911   Patient verbalizes understanding of instructions and care plan provided today and agrees to view in MyChart. Active MyChart status and patient understanding of how to access instructions and care plan via MyChart confirmed with patient.     [name], MSW, LCSW [facility] Care Management [place]  [facility] [email or web address] Phone [phone] 6:19 AM

## 2023-01-12 ENCOUNTER — Other Ambulatory Visit (INDEPENDENT_AMBULATORY_CARE_PROVIDER_SITE_OTHER): Payer: 59

## 2023-01-12 DIAGNOSIS — I272 Pulmonary hypertension, unspecified: Secondary | ICD-10-CM | POA: Diagnosis not present

## 2023-01-12 LAB — POCT URINALYSIS DIP (CLINITEK)
Bilirubin, UA: NEGATIVE
Blood, UA: NEGATIVE
Glucose, UA: NEGATIVE mg/dL
Leukocytes, UA: NEGATIVE
Nitrite, UA: NEGATIVE
POC PROTEIN,UA: NEGATIVE
Spec Grav, UA: 1.025 (ref 1.010–1.025)
Urobilinogen, UA: 1 U/dL
pH, UA: 5.5 (ref 5.0–8.0)

## 2023-01-12 NOTE — Progress Notes (Unsigned)
I,Nida Manfredi T Ashrita Chrismer, CMA,acting as a Neurosurgeon for [employer] documented all relevant documentation on the behalf of TIMA-NURSE,as directed by  [facility] while in the presence of TIMA-NURSE.  Subjective:  Patient ID: Nancy Lambert , female    DOB: 1941/03/24 , 82 y.o.   MRN: [id number]  No chief complaint on file.   HPI  HPI   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HCC)    Pulmonary hypertension (HCC)    Scleroderma (HCC)    Status post ablation of incompetent vein using laser bilateral leg 01/2019     Family History  Problem Relation Age of Onset   Breast cancer Mother    Clotting disorder Mother    Diabetes Mother    Other Father        drowned   Breast cancer Sister    Clotting disorder Sister    Breast cancer Sister    Breast cancer Daughter    Prostate cancer Son    Breast cancer Other    Colon cancer Neg Hx    Colon polyps Neg Hx    Esophageal cancer Neg Hx    Rectal cancer Neg Hx    Stomach cancer Neg Hx      Current Outpatient Medications:    amLODipine (NORVASC) 5 MG tablet, Take 1 tablet (5 mg total) by mouth daily., Disp: , Rfl:    aspirin EC 81 MG tablet, Take 81 mg by mouth daily., Disp: , Rfl:    losartan-hydrochlorothiazide (HYZAAR) 50-12.5 MG tablet, Take 1 tablet by mouth every morning., Disp: 30 tablet, Rfl: 2   mirtazapine (REMERON) 7.5 MG tablet, Take 1 tablet (7.5 mg total) by mouth at bedtime., Disp: 30 tablet, Rfl: 1   omeprazole (PRILOSEC) 40 MG capsule, Take 1 capsule (40 mg total) by mouth in the morning., Disp: 90 capsule, Rfl: 4   Riociguat (ADEMPAS) 2 MG TABS, Take 1 tablet by mouth 3 (three) times daily., Disp: 270 tablet, Rfl: 3   No Known Allergies   Review of Systems   There were no vitals filed for this visit. There is no height or weight on file to calculate BMI.  Wt Readings from Last 3  Encounters:  01/01/23 133 lb 6.4 oz (60.5 kg)  12/08/22 137 lb 3.2 oz (62.2 kg)  12/03/22 137 lb 6.4 oz (62.3 kg)     Objective:  Physical Exam      Assessment And Plan:  Pulmonary hypertension (HCC) -     POCT URINALYSIS DIP (CLINITEK)     No follow-ups on file.  Patient was given opportunity to ask questions. Patient verbalized understanding of the plan and was able to repeat key elements of the plan. All questions were answered to their satisfaction.  TIMA-NURSE  I, TIMA-NURSE, have reviewed all documentation for this visit. The documentation on 01/12/23 for the exam, diagnosis, procedures, and orders are all accurate and complete.   IF YOU HAVE BEEN REFERRED TO A SPECIALIST, IT MAY TAKE 1-2 WEEKS TO SCHEDULE/PROCESS THE REFERRAL. IF YOU HAVE NOT HEARD FROM US/SPECIALIST IN TWO WEEKS, PLEASE GIVE [place] A CALL AT [phone].   THE PATIENT IS ENCOURAGED TO PRACTICE SOCIAL DISTANCING DUE TO THE COVID-19 PANDEMIC.

## 2023-01-26 ENCOUNTER — Ambulatory Visit
Admission: RE | Admit: 2023-01-26 | Discharge: 2023-01-26 | Disposition: A | Discharge status: Home / Self Care | Payer: 59 | Source: Ambulatory Visit | Attending: Cardiology | Admitting: Cardiology

## 2023-01-26 DIAGNOSIS — R0609 Other forms of dyspnea: Secondary | ICD-10-CM

## 2023-01-26 DIAGNOSIS — I272 Pulmonary hypertension, unspecified: Secondary | ICD-10-CM

## 2023-01-26 DIAGNOSIS — R0602 Shortness of breath: Secondary | ICD-10-CM | POA: Diagnosis not present

## 2023-01-26 DIAGNOSIS — I517 Cardiomegaly: Secondary | ICD-10-CM | POA: Diagnosis not present

## 2023-01-31 ENCOUNTER — Other Ambulatory Visit: Payer: Self-pay | Admitting: Cardiology

## 2023-01-31 DIAGNOSIS — I10 Essential (primary) hypertension: Secondary | ICD-10-CM

## 2023-02-02 ENCOUNTER — Other Ambulatory Visit: Payer: Self-pay

## 2023-02-02 ENCOUNTER — Other Ambulatory Visit (HOSPITAL_COMMUNITY): Payer: Self-pay

## 2023-02-02 DIAGNOSIS — I10 Essential (primary) hypertension: Secondary | ICD-10-CM

## 2023-02-02 MED ORDER — AMLODIPINE BESYLATE 5 MG PO TABS: 5.0000 mg | ORAL_TABLET | Freq: Every day | ORAL | 3 refills | Status: DC

## 2023-02-02 MED ORDER — AMLODIPINE BESYLATE 5 MG PO TABS
5.0000 mg | ORAL_TABLET | Freq: Every day | ORAL | 2 refills | Status: DC
  Filled 2023-02-02: qty 90, 90d supply, fill #0

## 2023-02-02 MED ORDER — AMLODIPINE BESYLATE 5 MG PO TABS: 5.0000 mg | ORAL_TABLET | Freq: Every day | ORAL | 2 refills | Status: DC

## 2023-02-02 MED ORDER — AMLODIPINE BESYLATE 5 MG PO TABS
5.0000 mg | ORAL_TABLET | Freq: Every day | ORAL | 3 refills | Status: AC
Start: 2023-02-02 — End: 2024-01-13

## 2023-02-02 NOTE — Addendum Note (Signed)
Addended by: Erby Pian on: 02/02/2023 04:45 PM   Modules accepted: Orders

## 2023-02-03 ENCOUNTER — Other Ambulatory Visit (HOSPITAL_COMMUNITY): Payer: Self-pay

## 2023-02-12 ENCOUNTER — Ambulatory Visit: Payer: Self-pay | Admitting: Licensed Clinical Social Worker

## 2023-02-12 NOTE — Patient Instructions (Signed)
Visit Information  Thank you for taking time to visit with me today. Please don't hesitate to contact me if I can be of assistance to you.   Following are the goals we discussed today:   Goals Addressed             This Visit's Progress    LCSW-Management of MH Symptoms   On track    Activities and task to complete in order to accomplish goals.   Keep all upcoming appointments discussed today Continue with compliance of taking medication prescribed by Doctor Implement healthy coping skills discussed to assist with management of symptoms Review supportive resources on [facility] [place] ([facility])         Our next appointment is by telephone on 1/12 at 1:30 PM  Please call the care guide team at [phone] if you need to cancel or reschedule your appointment.   If you are experiencing a Mental Health or Behavioral Health Crisis or need someone to talk to, please call the Suicide and Crisis Lifeline: 988 call 911   Patient verbalizes understanding of instructions and care plan provided today and agrees to view in MyChart. Active MyChart status and patient understanding of how to access instructions and care plan via MyChart confirmed with patient.     [name], MSW, LCSW [facility] Care Management [place]  [facility] [email or web address] Phone [phone] 1:59 PM

## 2023-02-12 NOTE — Patient Outreach (Signed)
  Care Coordination   Follow Up Visit Note   02/12/2023 Name: Nancy Lambert MRN: [id number] DOB: 1940/08/16  Nancy Lambert is a 82 y.o. year old female who sees Dorothyann Peng, MD for primary care. I spoke with  [name] by phone today.  What matters to the patients health and wellness today?  Stress Management and Self-care    Goals Addressed             This Visit's Progress    LCSW-Management of MH Symptoms   On track    Activities and task to complete in order to accomplish goals.   Keep all upcoming appointments discussed today Continue with compliance of taking medication prescribed by Doctor Implement healthy coping skills discussed to assist with management of symptoms Review supportive resources on [facility] [place] ([facility])         SDOH assessments and interventions completed:  No     Care Coordination Interventions:  Yes, provided  Interventions Today    Flowsheet Row Most Recent Value  Chronic Disease   Chronic disease during today's visit Hypertension (HTN)  General Interventions   General Interventions Discussed/Reviewed General Interventions Reviewed, Doctor Visits  Doctor Visits Discussed/Reviewed Doctor Visits Reviewed  Mental Health Interventions   Mental Health Discussed/Reviewed Mental Health Reviewed, Coping Strategies  [Discussed strategies to promote boundary setting with family and promote positive mood. Pt is practicing gratitude thinking. Self-care encouraged]  Nutrition Interventions   Nutrition Discussed/Reviewed Nutrition Reviewed  Pharmacy Interventions   Pharmacy Dicussed/Reviewed Pharmacy Topics Reviewed, Medication Adherence  Safety Interventions   Safety Discussed/Reviewed Safety Reviewed       Follow up plan: Follow up call scheduled for 6-8 weeks    Encounter Outcome:  Patient Visit Completed   [name], MSW, LCSW [facility] Care Management [facility] Health  [facility] [name] Phone [phone] 1:58 PM

## 2023-02-18 NOTE — Progress Notes (Signed)
Chest x-ray two-view 02/17/2023 and comparison 03/29/2021: Chronic cardiomegaly, unchanged from prior exam. Stable mediastinal contours. Hilar prominence may be related to enlarged pulmonary arteries.  No pulmonary edema, focal airspace disease, pleural effusion or pneumothorax. No acute osseous findings.

## 2023-03-12 ENCOUNTER — Encounter: Payer: Self-pay | Admitting: Licensed Clinical Social Worker

## 2023-03-13 ENCOUNTER — Telehealth: Payer: Self-pay | Admitting: Licensed Clinical Social Worker

## 2023-03-13 NOTE — Patient Outreach (Signed)
  Care Coordination   03/12/2023 Name: Janelis Guck MRN: [id number] DOB: [birth year]   Care Coordination Outreach Attempts:  An unsuccessful telephone outreach was attempted for a scheduled appointment today.  Follow Up Plan:  Additional outreach attempts will be made to offer the patient care coordination information and services.   Encounter Outcome:  No Answer   Care Coordination Interventions:  No, not indicated    [name], MSW, LCSW [facility] Care Management [place]  [facility] [email or web address] Phone [phone] 5:16 PM

## 2023-03-21 ENCOUNTER — Other Ambulatory Visit: Payer: Self-pay | Admitting: Physician Assistant

## 2023-03-30 ENCOUNTER — Telehealth: Payer: Self-pay | Admitting: Pharmacy Technician

## 2023-03-30 ENCOUNTER — Other Ambulatory Visit (HOSPITAL_COMMUNITY): Payer: Self-pay

## 2023-03-30 NOTE — Telephone Encounter (Signed)
Pharmacy Patient Advocate Encounter   Received notification from CoverMyMeds that prior authorization for Adempas 2MG  tablets is required/requested.   Insurance verification completed.   The patient is insured through [facility] .   Per test claim: PA required; PA submitted to above mentioned insurance via CoverMyMeds Key/confirmation #/EOC [id number] Status is pending

## 2023-03-30 NOTE — Telephone Encounter (Signed)
Pharmacy Patient Advocate Encounter  Received notification from [facility] that Prior Authorization for Adempas 2MG  tablets has been APPROVED from 03/30/23 to 03/30/24   PA #/Case ID/Reference #: [id number]

## 2023-04-09 ENCOUNTER — Ambulatory Visit: Payer: 59 | Admitting: Internal Medicine

## 2023-04-09 ENCOUNTER — Telehealth: Payer: Self-pay

## 2023-04-09 ENCOUNTER — Emergency Department (HOSPITAL_BASED_OUTPATIENT_CLINIC_OR_DEPARTMENT_OTHER)
Admission: EM | Admit: 2023-04-09 | Discharge: 2023-04-09 | Disposition: A | Discharge status: Home / Self Care | Payer: 59 | Attending: Emergency Medicine | Admitting: Emergency Medicine

## 2023-04-09 ENCOUNTER — Encounter (HOSPITAL_BASED_OUTPATIENT_CLINIC_OR_DEPARTMENT_OTHER): Payer: Self-pay

## 2023-04-09 ENCOUNTER — Other Ambulatory Visit: Payer: Self-pay

## 2023-04-09 ENCOUNTER — Other Ambulatory Visit (HOSPITAL_BASED_OUTPATIENT_CLINIC_OR_DEPARTMENT_OTHER): Payer: Self-pay

## 2023-04-09 DIAGNOSIS — J101 Influenza due to other identified influenza virus with other respiratory manifestations: Secondary | ICD-10-CM | POA: Diagnosis not present

## 2023-04-09 DIAGNOSIS — R059 Cough, unspecified: Secondary | ICD-10-CM | POA: Diagnosis present

## 2023-04-09 DIAGNOSIS — Z7982 Long term (current) use of aspirin: Secondary | ICD-10-CM | POA: Insufficient documentation

## 2023-04-09 DIAGNOSIS — R197 Diarrhea, unspecified: Secondary | ICD-10-CM | POA: Diagnosis not present

## 2023-04-09 DIAGNOSIS — Z79899 Other long term (current) drug therapy: Secondary | ICD-10-CM | POA: Insufficient documentation

## 2023-04-09 DIAGNOSIS — Z20822 Contact with and (suspected) exposure to covid-19: Secondary | ICD-10-CM | POA: Insufficient documentation

## 2023-04-09 DIAGNOSIS — I10 Essential (primary) hypertension: Secondary | ICD-10-CM | POA: Insufficient documentation

## 2023-04-09 LAB — CBC
HCT: 44.7 % (ref 36.0–46.0)
Hemoglobin: 14.3 g/dL (ref 12.0–15.0)
MCH: 30.4 pg (ref 26.0–34.0)
MCHC: 32 g/dL (ref 30.0–36.0)
MCV: 94.9 fL (ref 80.0–100.0)
Platelets: 189 10*3/uL (ref 150–400)
RBC: 4.71 MIL/uL (ref 3.87–5.11)
RDW: 14.4 % (ref 11.5–15.5)
WBC: 4.4 10*3/uL (ref 4.0–10.5)
nRBC: 0 % (ref 0.0–0.2)

## 2023-04-09 LAB — URINALYSIS, ROUTINE W REFLEX MICROSCOPIC
Glucose, UA: NEGATIVE mg/dL
Ketones, ur: NEGATIVE mg/dL
Leukocytes,Ua: NEGATIVE
Nitrite: NEGATIVE
Protein, ur: 100 mg/dL — AB
Specific Gravity, Urine: >=1.03 (ref 1.005–1.030)
pH: 5.5 (ref 5.0–8.0)

## 2023-04-09 LAB — COMPREHENSIVE METABOLIC PANEL
ALT: 28 U/L (ref 0–44)
AST: 59 U/L — ABNORMAL HIGH (ref 15–41)
Albumin: 4.5 g/dL (ref 3.5–5.0)
Alkaline Phosphatase: 57 U/L (ref 38–126)
Anion gap: 12 (ref 5–15)
BUN: 21 mg/dL (ref 8–23)
CO2: 27 mmol/L (ref 22–32)
Calcium: 9.4 mg/dL (ref 8.9–10.3)
Chloride: 101 mmol/L (ref 98–111)
Creatinine, Ser: 0.91 mg/dL (ref 0.44–1.00)
GFR, Estimated: >60 mL/min (ref >60)
Glucose, Bld: 87 mg/dL (ref 70–99)
Potassium: 3.1 mmol/L — ABNORMAL LOW (ref 3.5–5.1)
Sodium: 140 mmol/L (ref 135–145)
Total Bilirubin: 0.8 mg/dL (ref 0.0–1.2)
Total Protein: 7.8 g/dL (ref 6.5–8.1)

## 2023-04-09 LAB — RESP PANEL BY RT-PCR (RSV, FLU A&B, COVID)  RVPGX2
Influenza A by PCR: POSITIVE — AB
Influenza B by PCR: NEGATIVE
Resp Syncytial Virus by PCR: NEGATIVE
SARS Coronavirus 2 by RT PCR: NEGATIVE

## 2023-04-09 LAB — URINALYSIS, MICROSCOPIC (REFLEX)

## 2023-04-09 LAB — LIPASE, BLOOD: Lipase: 30 U/L (ref 11–51)

## 2023-04-09 MED ORDER — ACETAMINOPHEN 500 MG PO TABS
1000.0000 mg | ORAL_TABLET | Freq: Once | ORAL | Status: AC
  Administered 2023-04-09: 1000 mg via ORAL
  Filled 2023-04-09: qty 2

## 2023-04-09 MED ORDER — OSELTAMIVIR PHOSPHATE 75 MG PO CAPS
75.0000 mg | ORAL_CAPSULE | Freq: Two times a day (BID) | ORAL | 0 refills | Status: DC
Start: 2023-04-09 — End: 2023-06-01
  Filled 2023-04-09: qty 10, 5d supply, fill #0

## 2023-04-09 MED ORDER — ONDANSETRON 4 MG PO TBDP
4.0000 mg | ORAL_TABLET | Freq: Three times a day (TID) | ORAL | 0 refills | Status: DC | PRN
  Filled 2023-04-09: qty 20, 7d supply, fill #0

## 2023-04-09 MED ORDER — ONDANSETRON HCL 4 MG/2ML IJ SOLN
4.0000 mg | Freq: Once | INTRAMUSCULAR | Status: AC
  Administered 2023-04-09: 4 mg via INTRAVENOUS
  Filled 2023-04-09: qty 2

## 2023-04-09 MED ORDER — LACTATED RINGERS IV BOLUS
1000.0000 mL | Freq: Once | INTRAVENOUS | Status: AC
  Administered 2023-04-09: 1000 mL via INTRAVENOUS

## 2023-04-09 MED ORDER — POTASSIUM CHLORIDE CRYS ER 20 MEQ PO TBCR
40.0000 meq | EXTENDED_RELEASE_TABLET | Freq: Once | ORAL | Status: AC
  Administered 2023-04-09: 40 meq via ORAL
  Filled 2023-04-09: qty 2

## 2023-04-09 NOTE — Progress Notes (Deleted)
 I,Chanteria Haggard T Emmitt, CMA,acting as a neurosurgeon for Catheryn LOISE Slocumb, MD.,have documented all relevant documentation on the behalf of Catheryn LOISE Slocumb, MD,as directed by  Catheryn LOISE Slocumb, MD while in the presence of Catheryn LOISE Slocumb, MD.  Subjective:  Patient ID: Nancy Lambert , female    DOB: 1940/10/11 , 83 y.o.   MRN: [id number]  No chief complaint on file.   HPI  Patient presents today for a bp & chol check. Patient reports compliance with her meds. She does not take anything prescribed for cholesterol. She denies headaches, chest pain and worsening sob or dizziness.     Hypertension This is a chronic problem. The current episode started more than 1 year ago. The problem has been gradually improving since onset. The problem is controlled. Risk factors for coronary artery disease include post-menopausal state, sedentary lifestyle and dyslipidemia. Past treatments include ACE inhibitors. There is no history of kidney disease or CAD/MI.  Hyperlipidemia This is a chronic problem. The current episode started more than 1 year ago. The problem is uncontrolled. She has no history of diabetes or liver disease. She is currently on no antihyperlipidemic treatment. The current treatment provides moderate improvement of lipids. Risk factors for coronary artery disease include hypertension, post-menopausal, a sedentary lifestyle and dyslipidemia.     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HCC)    Pulmonary hypertension (HCC)    Scleroderma (HCC)    Status post ablation of incompetent vein using laser bilateral leg 01/2019     Family History  Problem Relation Age of Onset   Breast cancer Mother    Clotting disorder Mother    Diabetes Mother    Other Father        drowned   Breast cancer Sister    Clotting disorder Sister    Breast cancer Sister    Breast cancer Daughter     Prostate cancer Son    Breast cancer Other    Colon cancer Neg Hx    Colon polyps Neg Hx    Esophageal cancer Neg Hx    Rectal cancer Neg Hx    Stomach cancer Neg Hx      Current Outpatient Medications:    amLODipine  (NORVASC ) 5 MG tablet, Take 1 tablet (5 mg total) by mouth daily., Disp: 180 tablet, Rfl: 3   aspirin EC 81 MG tablet, Take 81 mg by mouth daily., Disp: , Rfl:    losartan -hydrochlorothiazide (HYZAAR) 50-12.5 MG tablet, Take 1 tablet by mouth every morning., Disp: 30 tablet, Rfl: 2   mirtazapine  (REMERON ) 7.5 MG tablet, Take 1 tablet (7.5 mg total) by mouth at bedtime., Disp: 30 tablet, Rfl: 1   omeprazole  (PRILOSEC) 40 MG capsule, Take 1 capsule by mouth in the morning, Disp: 90 capsule, Rfl: 0   Riociguat  (ADEMPAS ) 2 MG TABS, Take 1 tablet by mouth 3 (three) times daily., Disp: 270 tablet, Rfl: 3   No Known Allergies   Review of Systems  Constitutional: Negative.   Respiratory: Negative.    Cardiovascular: Negative.   Neurological: Negative.   Psychiatric/Behavioral: Negative.       There were no vitals filed for this visit. There is no height or weight on file to calculate BMI.  Wt Readings from Last 3 Encounters:  01/01/23 133 lb 6.4 oz (60.5 kg)  12/08/22 137 lb 3.2 oz (  62.2 kg)  12/03/22 137 lb 6.4 oz (62.3 kg)     Objective:  Physical Exam      Assessment And Plan:  Hypertensive heart disease without heart failure  Pure hypercholesterolemia  Microscopic hematuria  Scleroderma (HCC)     No follow-ups on file.  Patient was given opportunity to ask questions. Patient verbalized understanding of the plan and was able to repeat key elements of the plan. All questions were answered to their satisfaction.  Catheryn LOISE Slocumb, MD  I, Catheryn LOISE Slocumb, MD, have reviewed all documentation for this visit. The documentation on 04/09/23 for the exam, diagnosis, procedures, and orders are all accurate and complete.   IF YOU HAVE BEEN REFERRED TO A  SPECIALIST, IT MAY TAKE 1-2 WEEKS TO SCHEDULE/PROCESS THE REFERRAL. IF YOU HAVE NOT HEARD FROM US /SPECIALIST IN TWO WEEKS, PLEASE GIVE US  A CALL AT [phone].   THE PATIENT IS ENCOURAGED TO PRACTICE SOCIAL DISTANCING DUE TO THE COVID-19 PANDEMIC.

## 2023-04-09 NOTE — ED Notes (Signed)
 Pt. Is feeling weak and tired and has been feeling like she doesn't want to eat.  Pt. Is positive for the flu.

## 2023-04-09 NOTE — Telephone Encounter (Signed)
 Patient called to schedule appointment because she had to cancel her appointment. Patient stated that she was having very loose stool and the she could not eat or drink anything without it coming back out. She states this has been a problem for 2-3 days. After speaking with Dr. Jarold she told the patient to go to the ER to get fluids because she will need them. Relayed the message and the patient stated she would go when her daughter got off of work.

## 2023-04-09 NOTE — ED Provider Notes (Signed)
 [facility] Provider Note   CSN: [id number] Arrival date & time: 04/09/23  1258     History  Chief Complaint  Patient presents with   Diarrhea    Nancy Lambert is a 83 y.o. female.  HPI     Sore throat, coughing with chest pain, was using vapo rub, nyquil, dayquil and not drinking anything got gas in body. Not eating or drinking for 3-4 days.  Low appetite. Didn't take a temperature. Urine felt warm so think smaybe fever. Did have chills.  Coughing, congestion.  Diarrhea 4-5 times a day.  Lightheaded, fatigue.  Feeling not herself. Live with daughter her granddaughter.   Have not had the flu in years and years, didn't have the flu shot ths year   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HCC)    Pulmonary hypertension (HCC)    Scleroderma (HCC)    Status post ablation of incompetent vein using laser bilateral leg 01/2019    Home Medications Prior to Admission medications   Medication Sig Start Date End Date Taking? Authorizing Provider  ondansetron  (ZOFRAN -ODT) 4 MG disintegrating tablet Take 1 tablet (4 mg total) by mouth every 8 (eight) hours as needed for nausea or vomiting. 04/09/23  Yes Dreama Longs, MD  oseltamivir  (TAMIFLU ) 75 MG capsule Take 1 capsule (75 mg total) by mouth every 12 (twelve) hours. 04/09/23  Yes Dreama Longs, MD  amLODipine  (NORVASC ) 5 MG tablet Take 1 tablet (5 mg total) by mouth daily. 02/02/23 05/03/23  Ladona Heinz, MD  aspirin EC 81 MG tablet Take 81 mg by mouth daily.    [provider]  losartan -hydrochlorothiazide (HYZAAR) 50-12.5 MG tablet Take 1 tablet by mouth every morning. 12/08/22   Ladona Heinz, MD  mirtazapine  (REMERON ) 7.5 MG tablet Take 1 tablet (7.5 mg total) by mouth at bedtime. 09/30/22   Jarold Medici, MD  omeprazole  (PRILOSEC) 40 MG capsule Take 1 capsule by  mouth in the morning 03/23/23   Esterwood, Amy S, PA-C  Riociguat  (ADEMPAS ) 2 MG TABS Take 1 tablet by mouth 3 (three) times daily. 12/30/22   Ladona Heinz, MD      Allergies    Patient has no known allergies.    Review of Systems   Review of Systems  Physical Exam Updated Vital Signs BP 109/86   Pulse 94   Temp 98.6 F (37 C)   Resp 18   SpO2 96%  Physical Exam Vitals and nursing note reviewed.  Constitutional:      General: She is not in acute distress.    Appearance: She is well-developed. She is not diaphoretic.  HENT:     Head: Normocephalic and atraumatic.  Eyes:     Conjunctiva/sclera: Conjunctivae normal.  Cardiovascular:     Rate and Rhythm: Normal rate and regular rhythm.     Heart sounds: Normal heart sounds. No murmur heard.    No friction rub. No gallop.  Pulmonary:     Effort: Pulmonary effort is normal. No respiratory distress.     Breath sounds: Normal breath sounds. No wheezing or rales.  Chest:     Chest wall: Tenderness present.  Abdominal:     General: There is no distension.     Palpations: Abdomen is soft.     Tenderness: There is no abdominal tenderness. There is  no guarding.  Musculoskeletal:        General: No tenderness.     Cervical back: Normal range of motion.  Skin:    General: Skin is warm and dry.     Findings: No erythema or rash.  Neurological:     Mental Status: She is alert and oriented to person, place, and time.     ED Results / Procedures / Treatments   Labs (all labs ordered are listed, but only abnormal results are displayed) Labs Reviewed  RESP PANEL BY RT-PCR (RSV, FLU A&B, COVID)  RVPGX2 - Abnormal; Notable for the following components:      Result Value   Influenza A by PCR POSITIVE (*)    All other components within normal limits  COMPREHENSIVE METABOLIC PANEL - Abnormal; Notable for the following components:   Potassium 3.1 (*)    AST 59 (*)    All other components within normal limits  URINALYSIS, ROUTINE W  REFLEX MICROSCOPIC - Abnormal; Notable for the following components:   Hgb urine dipstick MODERATE (*)    Bilirubin Urine SMALL (*)    Protein, ur 100 (*)    All other components within normal limits  URINALYSIS, MICROSCOPIC (REFLEX) - Abnormal; Notable for the following components:   Bacteria, UA FEW (*)    All other components within normal limits  LIPASE, BLOOD  CBC    EKG None  Radiology No results found.  Procedures Procedures    Medications Ordered in ED Medications  lactated ringers  bolus 1,000 mL (0 mLs Intravenous Stopped 04/09/23 1557)  ondansetron  (ZOFRAN ) injection 4 mg (4 mg Intravenous Given 04/09/23 1504)  acetaminophen  (TYLENOL ) tablet 1,000 mg (1,000 mg Oral Given 04/09/23 1506)  potassium chloride  SA (KLOR-CON  M) CR tablet 40 mEq (40 mEq Oral Given 04/09/23 1506)    ED Course/ Medical Decision Making/ A&P                                  82yo female with hx of htn, hlpd, scleroderma, pulmonary hypertension, presents with concern for cough, body aches, diarrhea.  Labs completed and evaluated by me show no evidence of pancreatitis, no clinically significant hepatitis, show mild hypokalemia.  No anemia nor leukocytosis. UA not consistent with UTI. Abdominal exam benign doubt intraabdominal surgical infection.  Clear breath sounds bilaterally doubt pneumonia, pneumothorax.  Influenza A positive. Suspect this is likely etiology of combination of symptoms.  Given IV fluids and feeling improved.   Given rx for tamiflu , zofran , recommend continued supportive care.  Patient discharged in stable condition with understanding of reasons to return.         Final Clinical Impression(s) / ED Diagnoses Final diagnoses:  Influenza A    Rx / DC Orders ED Discharge Orders          Ordered    oseltamivir  (TAMIFLU ) 75 MG capsule  Every 12 hours        04/09/23 1547    ondansetron  (ZOFRAN -ODT) 4 MG disintegrating tablet  Every 8 hours PRN        04/09/23 1548               Dreama Longs, MD 04/09/23 2237

## 2023-04-09 NOTE — ED Triage Notes (Signed)
 Pt sent over by doctors because she has had diarrhea for the past two days. Pt also c/o sore throat and cough.

## 2023-04-16 ENCOUNTER — Ambulatory Visit: Payer: 59 | Admitting: Family Medicine

## 2023-04-16 ENCOUNTER — Ambulatory Visit
Admission: RE | Admit: 2023-04-16 | Discharge: 2023-04-16 | Disposition: A | Discharge status: Home / Self Care | Payer: 59 | Source: Ambulatory Visit | Attending: Family Medicine | Admitting: Family Medicine

## 2023-04-16 ENCOUNTER — Encounter: Payer: Self-pay | Admitting: Family Medicine

## 2023-04-16 VITALS — BP 120/60 | HR 72 | Temp 100.8°F | Ht 64.6 in | Wt 124.0 lb

## 2023-04-16 DIAGNOSIS — R059 Cough, unspecified: Secondary | ICD-10-CM | POA: Diagnosis not present

## 2023-04-16 DIAGNOSIS — J069 Acute upper respiratory infection, unspecified: Secondary | ICD-10-CM

## 2023-04-16 DIAGNOSIS — R509 Fever, unspecified: Secondary | ICD-10-CM | POA: Diagnosis not present

## 2023-04-16 DIAGNOSIS — R0602 Shortness of breath: Secondary | ICD-10-CM

## 2023-04-16 DIAGNOSIS — J101 Influenza due to other identified influenza virus with other respiratory manifestations: Secondary | ICD-10-CM

## 2023-04-16 LAB — POC SOFIA 2 FLU + SARS ANTIGEN FIA
Influenza A, POC: NEGATIVE
Influenza B, POC: NEGATIVE
SARS Coronavirus 2 Ag: NEGATIVE

## 2023-04-16 MED ORDER — BENZONATATE 100 MG PO CAPS: 100.0000 mg | ORAL_CAPSULE | Freq: Three times a day (TID) | ORAL | 0 refills | Status: DC | PRN

## 2023-04-16 MED ORDER — TRIAMCINOLONE ACETONIDE 40 MG/ML IJ SUSP
40.0000 mg | Freq: Once | INTRAMUSCULAR | Status: AC
  Administered 2023-04-16: 40 mg via INTRAMUSCULAR

## 2023-04-16 MED ORDER — AZITHROMYCIN 250 MG PO TABS: ORAL_TABLET | ORAL | 0 refills | Status: AC

## 2023-04-16 NOTE — Progress Notes (Signed)
I,Jameka J Llittleton, CMA,acting as a Neurosurgeon for [employer], NP.,have documented all relevant documentation on the behalf of Ellender Hose, NP,as directed by  Ellender Hose, NP while in the presence of Ellender Hose, NP.  Subjective:  Patient ID: Nancy Lambert , female    DOB: 1940-09-14 , 83 y.o.   MRN: [id number]  Chief Complaint  Patient presents with   ER f/u    HPI  Patient is a 83 year old female who is accompanied by her daughter to the office today . Patient reports she went to the ER on 04/09/23 for sore throat, coughing, congestion , diarrhea and body aches. She was diagnosed with Influenza A and got a prescription for Tamiflu , Zofran but patient states that she did not take her medication because she did not know that she was suppose to take them Patient states that since has not felt any better since her visit to the ER but rather gotten worse, with body aches, congestion, productive cough,chills and a fever.   Daughter states that she has being taking Acetaminophen for body aches. Patient and daughter were advised that we will test for COVID 19 and also get a chest x-ray to rule out pneumonia. They voiced understanding and agreed with the treatment  plan which includes getting antibiotics for some days.     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HCC)    Pulmonary hypertension (HCC)    Scleroderma (HCC)    Status post ablation of incompetent vein using laser bilateral leg 01/2019     Family History  Problem Relation Age of Onset   Breast cancer Mother    Clotting disorder Mother    Diabetes Mother    Other Father        drowned   Breast cancer Sister    Clotting disorder Sister    Breast cancer Sister    Breast cancer Daughter    Prostate cancer Son    Breast cancer Other    Colon cancer Neg Hx    Colon polyps Neg Hx    Esophageal cancer Neg Hx     Rectal cancer Neg Hx    Stomach cancer Neg Hx      Current Outpatient Medications:    amLODipine (NORVASC) 5 MG tablet, Take 1 tablet (5 mg total) by mouth daily., Disp: 180 tablet, Rfl: 3   aspirin EC 81 MG tablet, Take 81 mg by mouth daily., Disp: , Rfl:    benzonatate (TESSALON PERLES) 100 MG capsule, Take 1 capsule (100 mg total) by mouth 3 (three) times daily as needed., Disp: 30 capsule, Rfl: 0   losartan-hydrochlorothiazide (HYZAAR) 50-12.5 MG tablet, Take 1 tablet by mouth every morning., Disp: 30 tablet, Rfl: 2   mirtazapine (REMERON) 7.5 MG tablet, Take 1 tablet (7.5 mg total) by mouth at bedtime., Disp: 30 tablet, Rfl: 1   omeprazole (PRILOSEC) 40 MG capsule, Take 1 capsule by mouth in the morning, Disp: 90 capsule, Rfl: 0   Riociguat (ADEMPAS) 2 MG TABS, Take 1 tablet by mouth 3 (three) times daily., Disp: 270 tablet, Rfl: 3   ondansetron (ZOFRAN-ODT) 4 MG disintegrating tablet, Take 1 tablet (4 mg total) by mouth every 8 (eight) hours as needed for nausea or vomiting. (Patient not taking: Reported on 04/16/2023), Disp: 20 tablet, Rfl: 0   oseltamivir (TAMIFLU) 75 MG  capsule, Take 1 capsule (75 mg total) by mouth every 12 (twelve) hours. (Patient not taking: Reported on 04/16/2023), Disp: 10 capsule, Rfl: 0   No Known Allergies   Review of Systems  Constitutional:  Positive for chills, fatigue and fever.  HENT:  Positive for congestion.   Respiratory:  Positive for cough and shortness of breath.   Cardiovascular: Negative.  Negative for chest pain and leg swelling.  Gastrointestinal: Negative.   Endocrine: Negative.   Genitourinary: Negative.   Musculoskeletal:  Positive for arthralgias.  Skin: Negative.   Neurological: Negative.   Psychiatric/Behavioral:  Negative for confusion.      Today's Vitals   04/16/23 0924  BP: 120/60  Pulse: 72  Temp: (!) 100.8 F (38.2 C)  TempSrc: Oral  Weight: 124 lb (56.2 kg)  Height: 5' 4.6" (1.641 m)   Body mass index is 20.89  kg/m.  Wt Readings from Last 3 Encounters:  04/16/23 124 lb (56.2 kg)  01/01/23 133 lb 6.4 oz (60.5 kg)  12/08/22 137 lb 3.2 oz (62.2 kg)    The ASCVD Risk score (Arnett DK, et al., 2019) failed to calculate for the following reasons:   The 2019 ASCVD risk score is only valid for ages 24 to 70  Objective:  Physical Exam HENT:     Head: Normocephalic.     Nose: Congestion present.  Cardiovascular:     Rate and Rhythm: Normal rate and regular rhythm.  Pulmonary:     Breath sounds: Rhonchi present.  Abdominal:     General: Bowel sounds are normal.  Skin:    General: Skin is warm.  Neurological:     Mental Status: She is alert and oriented to person, place, and time.  Psychiatric:        Behavior: Behavior normal.         Assessment And Plan:  Fever in adult -     POC SOFIA 2 FLU + SARS ANTIGEN FIA  URI with cough and congestion -     DG Chest 2 View; Future -     Azithromycin; Take 2 tablets (500 mg) on  Day 1,  followed by 1 tablet (250 mg) once daily on Days 2 through 5.  Dispense: 6 each; Refill: 0 -     Benzonatate; Take 1 capsule (100 mg total) by mouth 3 (three) times daily as needed.  Dispense: 30 capsule; Refill: 0  Shortness of breath -     Triamcinolone Acetonide    Return if symptoms worsen or fail to improve.  Patient was given opportunity to ask questions. Patient verbalized understanding of the plan and was able to repeat key elements of the plan. All questions were answered to their satisfaction.    I, Ellender Hose, NP, have reviewed all documentation for this visit. The documentation on 04/22/2023 for the exam, diagnosis, procedures, and orders are all accurate and complete.   IF YOU HAVE BEEN REFERRED TO A SPECIALIST, IT MAY TAKE 1-2 WEEKS TO SCHEDULE/PROCESS THE REFERRAL. IF YOU HAVE NOT HEARD FROM US/SPECIALIST IN TWO WEEKS, PLEASE GIVE [place] A CALL AT [phone].

## 2023-04-20 NOTE — Progress Notes (Signed)
Chest x-ray shows some thickening in her chest; possible pneumonia or due to her history of pulmonary edema.  Finish current treatment and repeat x-ray in 4 weeks to see if thickening has cleared.  Thanks,  Dennie Bible

## 2023-04-22 DIAGNOSIS — R0602 Shortness of breath: Secondary | ICD-10-CM | POA: Insufficient documentation

## 2023-04-22 DIAGNOSIS — J069 Acute upper respiratory infection, unspecified: Secondary | ICD-10-CM | POA: Insufficient documentation

## 2023-04-22 DIAGNOSIS — R509 Fever, unspecified: Secondary | ICD-10-CM | POA: Insufficient documentation

## 2023-05-25 ENCOUNTER — Ambulatory Visit: Payer: Self-pay | Admitting: Internal Medicine

## 2023-05-25 NOTE — Telephone Encounter (Signed)
  Chief Complaint: runny nose, constipation Symptoms: runny nose, clear mucus, intermittent cough, constipation, gas, bilateral thigh pain Frequency: x 1 week Pertinent Negatives: Patient denies fever, sore throat, earaches, nausea, vomiting, diarrhea Disposition: [] ED /[] Urgent Care (no appt availability in office) / [x] Appointment(In office/virtual)/ []  [facility]/ [] Home Care/ [] Refused Recommended Disposition /[] [facility]/ []  Follow-up with PCP Additional Notes: Patient initially calling in stating she is following up about her runny nose and states she was positive for flu and COVID. Patient tested positive for the flu January 2025. Patient states for about a week she has had a runny nose and she wasn't sure if it was because of the cold weather. Patient states she also has bilateral thigh pain and 2 weeks ago bumped her left thigh into a piece of furniture but she is unsure why the right side hurts as well. Patient mentions she took dulcolax recently for constipation and states she has suffered from constipation for a long time. She states sometimes she goes 2 weeks without a bowel movement, patient states she had a bowel movement this morning. Advised patient come in this week for acute visit. Reason for Disposition  Common cold with no complications  [1] Constipation persists > 1 week AND [2] no improvement after using Care Advice  Answer Assessment - Initial Assessment Questions 1. ONSET: "When did the nasal discharge start?"      X 1 week.  2. AMOUNT: "How much discharge is there?"      "It's not even that much", she states the "moisture is there, its not pouring out". Clear mucus.  3. COUGH: "Do you have a cough?" If Yes, ask: "Describe the color of your sputum" (clear, white, yellow, green)     Patient states she has been coughing off and on since having the flu back in the beginning of January.  4. RESPIRATORY DISTRESS: "Describe your breathing."       Patient states going up and down the stairs causes her to be SOB and states that has been going on for a long time and is why she is following up with her cardiologist.  5. FEVER: "Do you have a fever?" If Yes, ask: "What is your temperature, how was it measured, and when did it start?"     Denies. She states she has night sweats almost nightly.  6. SEVERITY: "Overall, how bad are you feeling right now?" (e.g., doesn't interfere with normal activities, staying home from school/work, staying in bed)      Patient states she feels like she is almost back to herself. She states she is wondering if she should get COVID tested.  7. OTHER SYMPTOMS: "Do you have any other symptoms?" (e.g., sore throat, earache, wheezing, vomiting)     Bilateral thigh pain (worse on the left, hit her left thigh about 2 weeks ago on furniture), constipation (she states sometimes she can go 2 weeks without a bowel movement, states she dulcolax)  Protocols used: Common Cold-A-AH, Constipation-A-AH

## 2023-05-25 NOTE — Telephone Encounter (Signed)
 Copied from CRM [id number]. Topic: Clinical - Medical Advice >> May 25, 2023 11:34 AM Franchot Heidelberg wrote: Reason for CRM: Pt called requesting to speak to a nurse about her lingering symptoms from having covid/flu.   Best contact: [phone]

## 2023-05-27 ENCOUNTER — Ambulatory Visit (HOSPITAL_COMMUNITY): Payer: 59 | Attending: Cardiology

## 2023-05-27 DIAGNOSIS — R0609 Other forms of dyspnea: Secondary | ICD-10-CM | POA: Insufficient documentation

## 2023-05-27 DIAGNOSIS — I272 Pulmonary hypertension, unspecified: Secondary | ICD-10-CM | POA: Insufficient documentation

## 2023-05-27 LAB — ECHOCARDIOGRAM COMPLETE
Area-P 1/2: 4.29 cm2
S' Lateral: 3.2 cm

## 2023-05-28 ENCOUNTER — Encounter (HOSPITAL_BASED_OUTPATIENT_CLINIC_OR_DEPARTMENT_OTHER): Payer: Self-pay | Admitting: Emergency Medicine

## 2023-05-28 ENCOUNTER — Emergency Department (HOSPITAL_BASED_OUTPATIENT_CLINIC_OR_DEPARTMENT_OTHER): Payer: 59

## 2023-05-28 ENCOUNTER — Ambulatory Visit: Payer: Self-pay | Admitting: Family Medicine

## 2023-05-28 ENCOUNTER — Emergency Department (HOSPITAL_BASED_OUTPATIENT_CLINIC_OR_DEPARTMENT_OTHER)
Admission: EM | Admit: 2023-05-28 | Discharge: 2023-05-28 | Disposition: A | Discharge status: Home / Self Care | Payer: 59 | Attending: Emergency Medicine | Admitting: Emergency Medicine

## 2023-05-28 ENCOUNTER — Other Ambulatory Visit: Payer: Self-pay

## 2023-05-28 DIAGNOSIS — S0990XA Unspecified injury of head, initial encounter: Secondary | ICD-10-CM | POA: Diagnosis not present

## 2023-05-28 DIAGNOSIS — M1909 Primary osteoarthritis, other specified site: Secondary | ICD-10-CM | POA: Diagnosis not present

## 2023-05-28 DIAGNOSIS — Z79899 Other long term (current) drug therapy: Secondary | ICD-10-CM | POA: Diagnosis not present

## 2023-05-28 DIAGNOSIS — W01198A Fall on same level from slipping, tripping and stumbling with subsequent striking against other object, initial encounter: Secondary | ICD-10-CM | POA: Diagnosis not present

## 2023-05-28 DIAGNOSIS — I10 Essential (primary) hypertension: Secondary | ICD-10-CM | POA: Diagnosis not present

## 2023-05-28 DIAGNOSIS — Z043 Encounter for examination and observation following other accident: Secondary | ICD-10-CM | POA: Diagnosis not present

## 2023-05-28 DIAGNOSIS — Z7982 Long term (current) use of aspirin: Secondary | ICD-10-CM | POA: Insufficient documentation

## 2023-05-28 DIAGNOSIS — W19XXXA Unspecified fall, initial encounter: Secondary | ICD-10-CM

## 2023-05-28 DIAGNOSIS — I6529 Occlusion and stenosis of unspecified carotid artery: Secondary | ICD-10-CM | POA: Diagnosis not present

## 2023-05-28 DIAGNOSIS — D320 Benign neoplasm of cerebral meninges: Secondary | ICD-10-CM | POA: Diagnosis not present

## 2023-05-28 DIAGNOSIS — M47812 Spondylosis without myelopathy or radiculopathy, cervical region: Secondary | ICD-10-CM | POA: Diagnosis not present

## 2023-05-28 NOTE — ED Provider Notes (Signed)
  EMERGENCY DEPARTMENT AT [facility] Provider Note   CSN: [id number] Arrival date & time: 05/28/23  4098     History  Chief Complaint  Patient presents with   Nancy Lambert is a 83 y.o. female.  Patient is an 83 year old female with past medical history of hypertension, GERD, scleroderma, pulmonary hypertension.  Patient presenting today for evaluation of a fall.  She was getting up from her recliner in the night when she got her legs tangled up in her house coat, then fell.  She fell forward and hit her head on the bed rail.  She denies any loss of consciousness.  She does have chronic neck pain, but describes no worsening of this.    The history is provided by the patient.       Home Medications Prior to Admission medications   Medication Sig Start Date End Date Taking? Authorizing Provider  amLODipine (NORVASC) 5 MG tablet Take 1 tablet (5 mg total) by mouth daily. 02/02/23 05/03/23  Yates Decamp, MD  aspirin EC 81 MG tablet Take 81 mg by mouth daily.    [provider]  benzonatate (TESSALON PERLES) 100 MG capsule Take 1 capsule (100 mg total) by mouth 3 (three) times daily as needed. 04/16/23 04/15/24  Ellender Hose, NP  losartan-hydrochlorothiazide (HYZAAR) 50-12.5 MG tablet Take 1 tablet by mouth every morning. 12/08/22   Yates Decamp, MD  mirtazapine (REMERON) 7.5 MG tablet Take 1 tablet (7.5 mg total) by mouth at bedtime. 09/30/22   Dorothyann Peng, MD  omeprazole (PRILOSEC) 40 MG capsule Take 1 capsule by mouth in the morning 03/23/23   Esterwood, Amy S, PA-C  ondansetron (ZOFRAN-ODT) 4 MG disintegrating tablet Take 1 tablet (4 mg total) by mouth every 8 (eight) hours as needed for nausea or vomiting. Patient not taking: Reported on 04/16/2023 04/09/23   Alvira Monday, MD  oseltamivir (TAMIFLU) 75 MG capsule Take 1 capsule (75 mg total) by mouth every 12 (twelve) hours. Patient not taking: Reported on 04/16/2023 04/09/23   Alvira Monday, MD   Riociguat (ADEMPAS) 2 MG TABS Take 1 tablet by mouth 3 (three) times daily. 12/30/22   Yates Decamp, MD      Allergies    Patient has no known allergies.    Review of Systems   Review of Systems  All other systems reviewed and are negative.   Physical Exam Updated Vital Signs BP 127/64 (BP Location: Right Arm)   Pulse 66   Temp 97.7 F (36.5 C) (Oral)   Resp 20   Ht 5\' 4"  (1.626 m)   Wt 61.2 kg   SpO2 95%   BMI 23.17 kg/m  Physical Exam Vitals and nursing note reviewed.  Constitutional:      General: She is not in acute distress.    Appearance: She is well-developed. She is not diaphoretic.  HENT:     Head: Normocephalic and atraumatic.  Eyes:     Extraocular Movements: Extraocular movements intact.     Pupils: Pupils are equal, round, and reactive to light.  Neck:     Comments: There is tenderness to palpation in the soft tissues of the cervical region.  No bony tenderness or step-off. Cardiovascular:     Rate and Rhythm: Normal rate and regular rhythm.     Heart sounds: No murmur heard.    No friction rub. No gallop.  Pulmonary:     Effort: Pulmonary effort is normal. No respiratory distress.  Breath sounds: Normal breath sounds. No wheezing.  Abdominal:     General: Bowel sounds are normal. There is no distension.     Palpations: Abdomen is soft.     Tenderness: There is no abdominal tenderness.  Musculoskeletal:        General: Normal range of motion.     Cervical back: Normal range of motion and neck supple.  Skin:    General: Skin is warm and dry.  Neurological:     General: No focal deficit present.     Mental Status: She is alert and oriented to person, place, and time.     Cranial Nerves: No cranial nerve deficit.     Motor: No weakness.     ED Results / Procedures / Treatments   Labs (all labs ordered are listed, but only abnormal results are displayed) Labs Reviewed - No data to display  EKG None  Radiology ECHOCARDIOGRAM  COMPLETE Result Date: 05/27/2023    ECHOCARDIOGRAM REPORT   Patient Name:   Nancy Lambert Date of Exam: 05/27/2023 Medical Rec #:  [id number]             Height:       64.6 in Accession #:    [id number]            Weight:       124.0 lb Date of Birth:  Dec 12, 1940             BSA:          1.607 m Patient Age:    82 years              BP:           145/67 mmHg Patient Gender: F                     HR:           72 bpm. Exam Location:  [address] Procedure: 2D Echo, 3D Echo, Cardiac Doppler, Color Doppler and Strain Analysis            (Both Spectral and Color Flow Doppler were utilized during            procedure). Indications:    I27.2 Pulmonary hypertension  History:        Patient has prior history of Echocardiogram examinations, most                 recent 05/28/2022. Signs/Symptoms:Dyspnea; Risk                 Factors:Hypertension.  Sonographer:    Clearence Ped RCS Referring Phys: [id number] Yates Decamp IMPRESSIONS  1. Left ventricular ejection fraction, by estimation, is 55 to 60%. Left ventricular ejection fraction by 3D volume is 56 %. The left ventricle has normal function. The left ventricle has no regional wall motion abnormalities. There is moderate left ventricular hypertrophy. Left ventricular diastolic parameters were normal. The average left ventricular global longitudinal strain is -15.1 %. The global longitudinal strain is abnormal.  2. Right ventricular systolic function is normal. The right ventricular size is normal. There is normal pulmonary artery systolic pressure. The estimated right ventricular systolic pressure is 28.8 mmHg.  3. Left atrial size was moderately dilated.  4. Right atrial size was severely dilated.  5. A small pericardial effusion is present. The pericardial effusion is circumferential. There is no evidence of cardiac tamponade.  6. The mitral valve is normal in structure. Mild mitral valve regurgitation.  No evidence of mitral stenosis.  7. Tricuspid valve regurgitation is  mild to moderate.  8. The aortic valve is tricuspid. Aortic valve regurgitation is not visualized. No aortic stenosis is present.  9. The inferior vena cava is normal in size with greater than 50% respiratory variability, suggesting right atrial pressure of 3 mmHg. FINDINGS  Left Ventricle: Left ventricular ejection fraction, by estimation, is 55 to 60%. Left ventricular ejection fraction by 3D volume is 56 %. The left ventricle has normal function. The left ventricle has no regional wall motion abnormalities. The average left ventricular global longitudinal strain is -15.1 %. Strain was performed and the global longitudinal strain is abnormal. The left ventricular internal cavity size was normal in size. There is moderate left ventricular hypertrophy. Left ventricular diastolic parameters were normal. Right Ventricle: The right ventricular size is normal. No increase in right ventricular wall thickness. Right ventricular systolic function is normal. There is normal pulmonary artery systolic pressure. The tricuspid regurgitant velocity is 2.54 m/s, and  with an assumed right atrial pressure of 3 mmHg, the estimated right ventricular systolic pressure is 28.8 mmHg. Left Atrium: Left atrial size was moderately dilated. Right Atrium: Right atrial size was severely dilated. Pericardium: A small pericardial effusion is present. The pericardial effusion is circumferential. There is no evidence of cardiac tamponade. Mitral Valve: The mitral valve is normal in structure. Mild mitral valve regurgitation. No evidence of mitral valve stenosis. Tricuspid Valve: The tricuspid valve is normal in structure. Tricuspid valve regurgitation is mild to moderate. No evidence of tricuspid stenosis. Aortic Valve: The aortic valve is tricuspid. Aortic valve regurgitation is not visualized. No aortic stenosis is present. Pulmonic Valve: The pulmonic valve was normal in structure. Pulmonic valve regurgitation is mild. No evidence of pulmonic  stenosis. Aorta: The aortic root and ascending aorta are structurally normal, with no evidence of dilitation. Venous: The inferior vena cava is normal in size with greater than 50% respiratory variability, suggesting right atrial pressure of 3 mmHg. IAS/Shunts: The atrial septum is grossly normal. Additional Comments: 3D was performed not requiring image post processing on an independent workstation and was normal.  LEFT VENTRICLE PLAX 2D LVIDd:         5.00 cm         Diastology LVIDs:         3.20 cm         LV e' medial:    7.07 cm/s LV PW:         1.20 cm         LV E/e' medial:  15.0 LV IVS:        1.40 cm         LV e' lateral:   15.60 cm/s LVOT diam:     2.00 cm         LV E/e' lateral: 6.8 LV SV:         101 LV SV Index:   63              2D LVOT Area:     3.14 cm        Longitudinal                                Strain                                2D  Strain GLS  -18.6 %                                (A2C):                                2D Strain GLS  -11.8 %                                (A3C):                                2D Strain GLS  -15.0 %                                (A4C):                                2D Strain GLS  -15.1 %                                Avg:                                 3D Volume EF                                LV 3D EF:    Left                                             ventricul                                             ar                                             ejection                                             fraction                                             by 3D                                             volume is  56 %.                                 3D Volume EF:                                3D EF:        56 %                                LV EDV:       131 ml                                LV ESV:       57 ml                                LV SV:        73 ml RIGHT VENTRICLE RV Basal diam:  4.30  cm RV Mid diam:    2.90 cm RV S prime:     15.40 cm/s TAPSE (M-mode): 3.1 cm RVSP:           28.8 mmHg LEFT ATRIUM             Index        RIGHT ATRIUM           Index LA diam:        3.60 cm 2.24 cm/m   RA Pressure: 3.00 mmHg LA Vol (A2C):   74.9 ml 46.60 ml/m  RA Area:     22.40 cm LA Vol (A4C):   72.5 ml 45.11 ml/m  RA Volume:   77.40 ml  48.15 ml/m LA Biplane Vol: 74.8 ml 46.54 ml/m  AORTIC VALVE LVOT Vmax:   132.00 cm/s LVOT Vmean:  98.500 cm/s LVOT VTI:    0.321 m  AORTA Ao Root diam: 3.40 cm Ao Asc diam:  3.50 cm MITRAL VALVE                TRICUSPID VALVE MV Area (PHT):              TR Peak grad:   25.8 mmHg MV Decel Time:              TR Vmax:        254.00 cm/s MV E velocity: 106.00 cm/s  Estimated RAP:  3.00 mmHg MV A velocity: 103.00 cm/s  RVSP:           28.8 mmHg MV E/A ratio:  1.03                             SHUNTS                             Systemic VTI:  0.32 m                             Systemic Diam: 2.00 cm Sunit Tolia Electronically signed by Tessa Lerner Signature Date/Time: 05/27/2023/2:32:37 PM    Final     Procedures Procedures  Medications Ordered in ED Medications - No data to display  ED Course/ Medical Decision Making/ A&P  Patient is an 83 year old female presenting after a fall, the details of which are described in the HPI.  Patient arrives here with stable vital signs and is afebrile.  She is neurologically intact.  CT scan of the head and cervical spine obtained showing no acute intracranial or cervical process.  There is the suggestion of a possible meningioma for which an MRI was recommended.  Patient was notified of this finding and MRI will be scheduled as an outpatient.  Patient otherwise seems appropriate for discharge.  She may have perhaps some mild concussion, but no evidence for bleed or skull fracture.  Final Clinical Impression(s) / ED Diagnoses Final diagnoses:  None    Rx / DC Orders ED Discharge Orders     None         Geoffery Lyons, MD 05/28/23 [phone]

## 2023-05-28 NOTE — Discharge Instructions (Addendum)
 Take Tylenol 1000 mg every 8 hours as needed for headache.  Return to the emergency department if you develop worsening headache, seizures/convulsions, weakness/numbness, or for other new and concerning symptoms.  An MRI has been ordered as follow-up of your CT scan.  Radiology should call you to make these arrangements.

## 2023-05-28 NOTE — ED Triage Notes (Signed)
 Pt states  was getting  up to go to bed and thinks gown got caught in feet and fell. Hit left arm, both legs and left front head.

## 2023-05-31 ENCOUNTER — Encounter: Payer: Self-pay | Admitting: Cardiology

## 2023-05-31 NOTE — Progress Notes (Signed)
 Very stable echocardiogram, normal pulmonary artery pressure.

## 2023-06-01 ENCOUNTER — Ambulatory Visit: Payer: 59 | Attending: Cardiology | Admitting: Cardiology

## 2023-06-01 ENCOUNTER — Telehealth: Payer: Self-pay

## 2023-06-01 ENCOUNTER — Encounter: Payer: Self-pay | Admitting: Cardiology

## 2023-06-01 ENCOUNTER — Ambulatory Visit: Payer: Self-pay | Admitting: Internal Medicine

## 2023-06-01 VITALS — BP 124/68 | HR 75 | Resp 16 | Ht 64.0 in | Wt 128.0 lb

## 2023-06-01 DIAGNOSIS — I3139 Other pericardial effusion (noninflammatory): Secondary | ICD-10-CM | POA: Diagnosis not present

## 2023-06-01 DIAGNOSIS — I10 Essential (primary) hypertension: Secondary | ICD-10-CM

## 2023-06-01 DIAGNOSIS — I272 Pulmonary hypertension, unspecified: Secondary | ICD-10-CM

## 2023-06-01 NOTE — Patient Instructions (Signed)

## 2023-06-01 NOTE — Progress Notes (Signed)
 Cardiology Office Note:  .   Date:  06/01/2023  ID:  Nancy Lambert, DOB 08-22-1940, MRN [id number] PCP: Dorothyann Peng, MD  [facility] Providers Cardiologist:  Yates Decamp, MD   History of Present Illness: Marland Kitchen   Nancy Lambert is a 83 y.o. 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 She is on Adempas for pulm HTN and has chronic dyspnea, lumbar radiculopathy.   She presents for a 48-month office visit, states that her dyspnea is remained stable, leg edema has remained stable, in fact she has reduced the dose of amlodipine from 5 mg every day to every other day with resolution of leg edema. Labs   Lab Results  Component Value Date   CHOL 178 09/30/2022   HDL 67 09/30/2022   LDLCALC 98 09/30/2022   TRIG 66 09/30/2022   CHOLHDL 2.7 09/30/2022   Lab Results  Component Value Date   NA 140 04/09/2023   K 3.1 (L) 04/09/2023   CO2 27 04/09/2023   GLUCOSE 87 04/09/2023   BUN 21 04/09/2023   CREATININE 0.91 04/09/2023   CALCIUM 9.4 04/09/2023   EGFR 88 12/10/2022   GFRNONAA >60 04/09/2023      Latest Ref Rng & Units 04/09/2023    1:10 PM 12/10/2022   10:10 AM 09/30/2022   11:33 AM  BMP  Glucose 70 - 99 mg/dL 87  84  89   BUN 8 - 23 mg/dL 21  13  14    Creatinine 0.44 - 1.00 mg/dL 4.09  8.11  9.14   BUN/Creat Ratio 12 - 28  19  19    Sodium 135 - 145 mmol/L 140  145  143   Potassium 3.5 - 5.1 mmol/L 3.1  3.6  4.0   Chloride 98 - 111 mmol/L 101  106  103   CO2 22 - 32 mmol/L 27  25  23    Calcium 8.9 - 10.3 mg/dL 9.4  9.4  9.7       Latest Ref Rng & Units 04/09/2023    1:10 PM 09/30/2022   11:33 AM 11/21/2021   11:00 AM  CBC  WBC 4.0 - 10.5 K/uL 4.4  3.8  3.3   Hemoglobin 12.0 - 15.0 g/dL 78.2  95.6  21.3   Hematocrit 36.0 - 46.0 % 44.7  37.0  38.5   Platelets 150 - 400 K/uL 189  316  316    No results found for: "HGBA1C"  Lab Results  Component  Value Date   TSH 2.450 09/30/2022    Review of Systems  Cardiovascular:  Positive for dyspnea on exertion (stable) and leg swelling (occasional). Negative for chest pain.   Physical Exam:   VS:  BP 124/68 (BP Location: Left Arm, Patient Position: Sitting, Cuff Size: Normal)   Pulse 75   Resp 16   Ht 5\' 4"  (1.626 m)   Wt 128 lb (58.1 kg)   SpO2 93%   BMI 21.97 kg/m    Wt Readings from Last 3 Encounters:  06/01/23 128 lb (58.1 kg)  05/28/23 135 lb (61.2 kg)  04/16/23 124 lb (56.2 kg)    Physical Exam Neck:     Vascular: No JVD.  Cardiovascular:     Rate and Rhythm: Normal rate and regular rhythm.     Pulses: Intact distal pulses.          Dorsalis pedis pulses are 2+ on the right side and  2+ on the left side.       Posterior tibial pulses are 0 on the right side and 0 on the left side.     Heart sounds: S1 normal and S2 normal. Murmur heard.     Early systolic murmur is present with a grade of 2/6 at the upper right sternal border.     No gallop.  Pulmonary:     Effort: Pulmonary effort is normal.     Breath sounds: Examination of the right-lower field reveals rales. Examination of the left-lower field reveals rales. Rales present.  Abdominal:     General: Bowel sounds are normal.     Palpations: Abdomen is soft.  Musculoskeletal:     Right lower leg: No edema.     Left lower leg: No edema.    Studies Reviewed: Marland Kitchen    ECHOCARDIOGRAM COMPLETE 05/27/2023  1. Left ventricular ejection fraction, by estimation, is 55 to 60%. Left ventricular ejection fraction by 3D volume is 56 %. The left ventricle has normal function. The left ventricle has no regional wall motion abnormalities. There is moderate left ventricular hypertrophy. Left ventricular diastolic parameters were normal. The average left ventricular global longitudinal strain is -15.1 %. The global longitudinal strain is abnormal. 2. Right ventricular systolic function is normal. The right ventricular size is normal. There  is normal pulmonary artery systolic pressure. The estimated right ventricular systolic pressure is 28.8 mmHg. 3. Left atrial size was moderately dilated. 4. Right atrial size was severely dilated. 5. A small pericardial effusion is present. The pericardial effusion is circumferential. There is no evidence of cardiac tamponade. 6. The mitral valve is normal in structure. Mild mitral valve regurgitation. No evidence of mitral stenosis. 7. Tricuspid valve regurgitation is mild to moderate. 8. The aortic valve is tricuspid. Aortic valve regurgitation is not visualized. No aortic stenosis is present. 9. The inferior vena cava is normal in size with greater than 50% respiratory variability, suggesting right atrial pressure of 3 mmHg.    EKG:    EKG 12/08/2022: Normal sinus rhythm at the rate of 65 bpm, right atrial enlargement, left anterior fascicular block.  IVCD, LVH.  Poor R progression, cannot exclude anterolateral infarct old.  T wave abnormality, cannot exclude high lateral ischemia.  PACs, bigeminal pattern versus grouped beating.  Medications and allergies    No Known Allergies   Current Outpatient Medications:    amLODipine (NORVASC) 5 MG tablet, Take 1 tablet (5 mg total) by mouth daily., Disp: 180 tablet, Rfl: 3   aspirin EC 81 MG tablet, Take 81 mg by mouth daily., Disp: , Rfl:    losartan-hydrochlorothiazide (HYZAAR) 50-12.5 MG tablet, Take 1 tablet by mouth every morning., Disp: 30 tablet, Rfl: 2   mirtazapine (REMERON) 7.5 MG tablet, Take 1 tablet (7.5 mg total) by mouth at bedtime., Disp: 30 tablet, Rfl: 1   omeprazole (PRILOSEC) 40 MG capsule, Take 1 capsule by mouth in the morning, Disp: 90 capsule, Rfl: 0   Riociguat (ADEMPAS) 2 MG TABS, Take 1 tablet by mouth 3 (three) times daily., Disp: 270 tablet, Rfl: 3   ASSESSMENT AND PLAN: .      ICD-10-CM   1. Pulmonary hypertension (HCC)  I27.20     2. Primary hypertension  I10     3. Pericardial effusion  I31.39       1.  Pulmonary hypertension (HCC) Patient is presently doing well with primary pulmonary hypertension with treatment with Adempas 2 mg 3 times daily, continue the same.  Reviewed the echocardiogram, RV function has completely remained normal and there is no evidence of pulmonary hypertension further.  2. Primary hypertension Blood pressure is also well-controlled on 5 mg amlodipine every other day, I also suspect Adempas is contributing to improve blood pressure as well.  3. Pericardial effusion Patient has scleroderma, pulmonary fibrosis and also very mild pericardial effusion is noted on echocardiogram that has remained stable over the years.  There is no compromise or pericardial tamponade.  Continue present medications, I will see her back in a year or sooner if problems.  Continue amlodipine 5 mg every other day. Signed,  Yates Decamp, MD, [facility] 06/01/2023, 11:48 AM [facility] [address] [place], [place] [address] Phone: [phone]. Fax:  [phone]

## 2023-06-01 NOTE — Transitions of Care (Post Inpatient/ED Visit) (Signed)
   06/01/2023  Name: Nancy Lambert MRN: [id number] DOB: Aug 21, 1940  Today's TOC FU Call Status: Today's TOC FU Call Status:: Successful TOC FU Call Completed TOC FU Call Complete Date: 06/01/23 Patient's Name and Date of Birth confirmed.  Transition Care Management Follow-up Telephone Call Date of Discharge: 05/28/23 Discharge Facility: [facility] Type of Discharge: Emergency Department Reason for ED Visit: Other: How have you been since you were released from the hospital?: Better Any questions or concerns?: Yes Patient Questions/Concerns:: she wants a handicap placard Patient Questions/Concerns Addressed: Provided Patient Educational Materials  Items Reviewed: Did you receive and understand the discharge instructions provided?: Yes Medications obtained,verified, and reconciled?: Yes (Medications Reviewed) Any new allergies since your discharge?: No Dietary orders reviewed?: No Do you have support at home?: Yes People in Home: child(ren), dependent, grandchild(ren)  Medications Reviewed Today: Medications Reviewed Today   Medications were not reviewed in this encounter     Home Care and Equipment/Supplies: Were Home Health Services Ordered?: NA Any new equipment or medical supplies ordered?: NA  Functional Questionnaire: Do you need assistance with bathing/showering or dressing?: No Do you need assistance with meal preparation?: No Do you need assistance with eating?: No Do you have difficulty maintaining continence: No Do you need assistance with getting out of bed/getting out of a chair/moving?: No Do you have difficulty managing or taking your medications?: No  Follow up appointments reviewed: PCP Follow-up appointment confirmed?: Yes Date of PCP follow-up appointment?: 06/01/23 Follow-up Provider: Dorothyann Peng MD [facility] Follow-up appointment confirmed?: NA Do you need transportation to your follow-up appointment?: No Do you  understand care options if your condition(s) worsen?: Yes-patient verbalized understanding    SIGNATUREYL,RMA

## 2023-06-02 ENCOUNTER — Encounter: Payer: Self-pay | Admitting: Internal Medicine

## 2023-06-02 ENCOUNTER — Ambulatory Visit (INDEPENDENT_AMBULATORY_CARE_PROVIDER_SITE_OTHER): Admitting: Internal Medicine

## 2023-06-02 VITALS — BP 110/72 | HR 76 | Temp 98.4°F | Ht 64.0 in | Wt 130.6 lb

## 2023-06-02 DIAGNOSIS — W19XXXS Unspecified fall, sequela: Secondary | ICD-10-CM | POA: Diagnosis not present

## 2023-06-02 DIAGNOSIS — R9389 Abnormal findings on diagnostic imaging of other specified body structures: Secondary | ICD-10-CM | POA: Insufficient documentation

## 2023-06-02 DIAGNOSIS — E876 Hypokalemia: Secondary | ICD-10-CM

## 2023-06-02 DIAGNOSIS — G9389 Other specified disorders of brain: Secondary | ICD-10-CM

## 2023-06-02 DIAGNOSIS — W19XXXA Unspecified fall, initial encounter: Secondary | ICD-10-CM | POA: Insufficient documentation

## 2023-06-02 NOTE — Progress Notes (Signed)
 I,[name], CMA,acting as a Neurosurgeon for Nancy Aliment, MD.,have documented all relevant documentation on the behalf of Nancy Aliment, MD,as directed by  Nancy Aliment, MD while in the presence of Nancy Aliment, MD.  Subjective:  Patient ID: Nancy Lambert , female    DOB: 12/23/1940 , 83 y.o.   MRN: [id number]  Chief Complaint  Patient presents with   ER follow up    HPI  Patient presents today for ED follow up. She went to [facility] ED on 05/28/23 for fall. She was getting up from her recliner in the night when she got her legs tangled up in her house coat, then fell.  She fell forward and hit her head on the bed rail.  She denies any loss of consciousness.  She does have chronic neck pain, but describes no worsening of this.  ER evaluation: CT scan of the head and cervical spine obtained showing no acute intracranial or cervical process. There is the suggestion of a possible meningioma for which an MRI was recommended. Patient was notified of this finding and MRI will be scheduled as an outpatient.   Today she reports feeling a little tired. She does feel much better.      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HCC)    Pulmonary hypertension (HCC)    Scleroderma (HCC)    Status post ablation of incompetent vein using laser bilateral leg 01/2019     Family History  Problem Relation Age of Onset   Breast cancer Mother    Clotting disorder Mother    Diabetes Mother    Other Father        drowned   Breast cancer Sister    Clotting disorder Sister    Breast cancer Sister    Breast cancer Daughter    Prostate cancer Son    Breast cancer Other    Colon cancer Neg Hx    Colon polyps Neg Hx    Esophageal cancer Neg Hx    Rectal cancer Neg Hx    Stomach cancer Neg Hx      Current Outpatient Medications:    aspirin EC 81 MG tablet,  Take 81 mg by mouth daily., Disp: , Rfl:    losartan-hydrochlorothiazide (HYZAAR) 50-12.5 MG tablet, Take 1 tablet by mouth every morning., Disp: 30 tablet, Rfl: 2   mirtazapine (REMERON) 7.5 MG tablet, Take 1 tablet (7.5 mg total) by mouth at bedtime., Disp: 30 tablet, Rfl: 1   omeprazole (PRILOSEC) 40 MG capsule, Take 1 capsule by mouth in the morning, Disp: 90 capsule, Rfl: 0   Riociguat (ADEMPAS) 2 MG TABS, Take 1 tablet by mouth 3 (three) times daily., Disp: 270 tablet, Rfl: 3   amLODipine (NORVASC) 5 MG tablet, Take 1 tablet (5 mg total) by mouth daily. (Patient taking differently: Take 5 mg by mouth every other day.), Disp: 180 tablet, Rfl: 3   No Known Allergies   Review of Systems  Constitutional: Negative.   Respiratory: Negative.    Cardiovascular: Negative.   Neurological: Negative.   Psychiatric/Behavioral: Negative.       Today's Vitals   06/02/23 1147  BP: 110/72  Pulse: 76  Temp: 98.4 F (36.9 C)  SpO2: 98%  Weight: 130 lb 9.6 oz (59.2 kg)  Height: 5\' 4"  (1.626 m)  Body mass index is 22.42 kg/m.  Wt Readings from Last 3 Encounters:  06/02/23 130 lb 9.6 oz (59.2 kg)  06/01/23 128 lb (58.1 kg)  05/28/23 135 lb (61.2 kg)     Objective:  Physical Exam Vitals and nursing note reviewed.  Constitutional:      Appearance: Normal appearance.  HENT:     Head: Normocephalic and atraumatic.  Eyes:     Extraocular Movements: Extraocular movements intact.  Cardiovascular:     Rate and Rhythm: Normal rate and regular rhythm.     Heart sounds: Normal heart sounds.  Pulmonary:     Effort: Pulmonary effort is normal.     Breath sounds: Normal breath sounds.  Skin:    General: Skin is warm.     Comments: Abrasion left side, near temporal region  Neurological:     General: No focal deficit present.     Mental Status: She is alert.  Psychiatric:        Mood and Affect: Mood normal.        Behavior: Behavior normal.     EXAM: CT HEAD WITHOUT CONTRAST    TECHNIQUE: Contiguous axial images were obtained from the base of the skull through the vertex without intravenous contrast.   RADIATION DOSE REDUCTION: This exam was performed according to the departmental dose-optimization program which includes automated exposure control, adjustment of the mA and/or kV according to patient size and/or use of iterative reconstruction technique.   COMPARISON:  None Available.   FINDINGS: Brain: Normal cerebral volume. No midline shift, mass effect. No ventriculomegaly.   Patchy and confluent bilateral cerebral white matter hypodensity with asymmetric deep white matter capsule involvement. Heterogeneity in the deep gray nuclei especially the right thalamus. No acute or chronic cortically based infarct identified.   Abnormal intermediate density masslike enlargement of the left cavernous sinus (series 2 image 9 and coronal image 46) in an area of 2-3 cm and might extend laterally into the left middle cranial fossa, uncertain. No evidence of associated left temporal lobe edema. Suprasellar cistern remains patent. Meckel's cave appears to remain normal.   Other basilar cisterns appear normal.   Vascular: Superimposed extensive calcified atherosclerosis at the skull base including the left cavernous sinus. No suspicious intracranial vascular hyperdensity.   Skull: Intact.  No fracture identified.   Sinuses/Orbits: Trace bubbly opacity at the right frontoethmoidal recess, in the posterior right sphenoid. Other visualized paranasal sinuses and mastoids are stable and well aerated.   Other: No convincing acute orbit or scalp soft tissue injury. No soft tissue gas.   IMPRESSION: 1.  No acute traumatic injury identified. 2. 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 This is nonspecific but Meningioma would be most common. Brain MRI without and with contrast would best  characterize further. No associated cerebral edema by CT. 3. 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     Electronically Signed   By: Odessa Fleming M.D.   On: 05/28/2023 05:53    Assessment And Plan:  Fall, sequela Assessment & Plan: This occurred on 05/28/23 early am.  ER notes reviewed in detail.  She is encouraged to wear shorter loungewear so her feet don't get tangled.  Also encouraged to remove any loose cords, throw rugs,etc.    Abnormal CT scan Assessment & Plan: CT scan results reviewed in detail.  I will order MRI brain for further evaluation of left cavernous sinus  soft tissue mass which extends into the left middle cranial fossa.   Orders: -     MR BRAIN W WO CONTRAST; Future  Brain mass Assessment & Plan: Again, will refer her for MRI brain w/ and w/o contrast per Radiology recommendation. ER provider ordered; however, insurance would not approve as an outpatient.   Orders: -     MR BRAIN W WO CONTRAST; Future  Hypokalemia -     CMP14+EGFR     Return if symptoms worsen or fail to improve.  Patient was given opportunity to ask questions. Patient verbalized understanding of the plan and was able to repeat key elements of the plan. All questions were answered to their satisfaction.    I, Nancy Aliment, MD, have reviewed all documentation for this visit. The documentation on 06/02/23 for the exam, diagnosis, procedures, and orders are all accurate and complete.   IF YOU HAVE BEEN REFERRED TO A SPECIALIST, IT MAY TAKE 1-2 WEEKS TO SCHEDULE/PROCESS THE REFERRAL. IF YOU HAVE NOT HEARD FROM US/SPECIALIST IN TWO WEEKS, PLEASE GIVE [place] A CALL AT [phone].   THE PATIENT IS ENCOURAGED TO PRACTICE SOCIAL DISTANCING DUE TO THE COVID-19 PANDEMIC.

## 2023-06-02 NOTE — Assessment & Plan Note (Signed)
 Again, will refer her for MRI brain w/ and w/o contrast per Radiology recommendation. ER provider ordered; however, insurance would not approve as an outpatient.

## 2023-06-02 NOTE — Patient Instructions (Signed)
Fall Prevention in the Home, Adult Falls can cause injuries and affect people of all ages. There are many simple things that you can do to make your home safe and to help prevent falls. If you need it, ask for help making these changes. What actions can I take to prevent falls? General information Use good lighting in all rooms. Make sure to: Replace any light bulbs that burn out. Turn on lights if it is dark and use night-lights. Keep items that you use often in easy-to-reach places. Lower the shelves around your home if needed. Move furniture so that there are clear paths around it. Do not keep throw rugs or other things on the floor that can make you trip. If any of your floors are uneven, fix them. Add color or contrast paint or tape to clearly mark and help you see: Grab bars or handrails. First and last steps of staircases. Where the edge of each step is. If you use a ladder or stepladder: Make sure that it is fully opened. Do not climb a closed ladder. Make sure the sides of the ladder are locked in place. Have someone hold the ladder while you use it. Know where your pets are as you move through your home. What can I do in the bathroom?     Keep the floor dry. Clean up any water that is on the floor right away. Remove soap buildup in the bathtub or shower. Buildup makes bathtubs and showers slippery. Use non-skid mats or decals on the floor of the bathtub or shower. Attach bath mats securely with double-sided, non-slip rug tape. If you need to sit down while you are in the shower, use a non-slip stool. Install grab bars by the toilet and in the bathtub and shower. Do not use towel bars as grab bars. What can I do in the bedroom? Make sure that you have a light by your bed that is easy to reach. Do not use any sheets or blankets on your bed that hang to the floor. Have a firm bench or chair with side arms that you can use for support when you get dressed. What can I do in  the kitchen? Clean up any spills right away. If you need to reach something above you, use a sturdy step stool that has a grab bar. Keep electrical cables out of the way. Do not use floor polish or wax that makes floors slippery. What can I do with my stairs? Do not leave anything on the stairs. Make sure that you have a light switch at the top and the bottom of the stairs. Have them installed if you do not have them. Make sure that there are handrails on both sides of the stairs. Fix handrails that are broken or loose. Make sure that handrails are as long as the staircases. Install non-slip stair treads on all stairs in your home if they do not have carpet. Avoid having throw rugs at the top or bottom of stairs, or secure the rugs with carpet tape to prevent them from moving. Choose a carpet design that does not hide the edge of steps on the stairs. Make sure that carpet is firmly attached to the stairs. Fix any carpet that is loose or worn. What can I do on the outside of my home? Use bright outdoor lighting. Repair the edges of walkways and driveways and fix any cracks. Clear paths of anything that can make you trip, such as tools or rocks. Add   color or contrast paint or tape to clearly mark and help you see high doorway thresholds. Trim any bushes or trees on the main path into your home. Check that handrails are securely fastened and in good repair. Both sides of all steps should have handrails. Install guardrails along the edges of any raised decks or porches. Have leaves, snow, and ice cleared regularly. Use sand, salt, or ice melt on walkways during winter months if you live where there is ice and snow. In the garage, clean up any spills right away, including grease or oil spills. What other actions can I take? Review your medicines with your health care provider. Some medicines can make you confused or feel dizzy. This can increase your chance of falling. Wear closed-toe shoes that  fit well and support your feet. Wear shoes that have rubber soles and low heels. Use a cane, walker, scooter, or crutches that help you move around if needed. Talk with your provider about other ways that you can decrease your risk of falls. This may include seeing a physical therapist to learn to do exercises to improve movement and strength. Where to find more information Centers for Disease Control and Prevention, [employer]: cdc.gov National Institute on Aging: nia.nih.gov National Institute on Aging: nia.nih.gov Contact a health care provider if: You are afraid of falling at home. You feel weak, drowsy, or dizzy at home. You fall at home. Get help right away if you: Lose consciousness or have trouble moving after a fall. Have a fall that causes a head injury. These symptoms may be an emergency. Get help right away. Call 911. Do not wait to see if the symptoms will go away. Do not drive yourself to the hospital. This information is not intended to replace advice given to you by your health care provider. Make sure you discuss any questions you have with your health care provider. Document Revised: 11/18/2021 Document Reviewed: 11/18/2021 Elsevier Patient Education  2024 Elsevier Inc.  

## 2023-06-02 NOTE — Assessment & Plan Note (Addendum)
 CT scan results reviewed in detail.  I will order MRI brain for further evaluation of left cavernous sinus soft tissue mass which extends into the left middle cranial fossa.

## 2023-06-02 NOTE — Assessment & Plan Note (Signed)
 This occurred on 05/28/23 early am.  ER notes reviewed in detail.  She is encouraged to wear shorter loungewear so her feet don't get tangled.  Also encouraged to remove any loose cords, throw rugs,etc.

## 2023-06-03 ENCOUNTER — Ambulatory Visit: Payer: Self-pay | Admitting: Internal Medicine

## 2023-06-03 LAB — CMP14+EGFR
ALT: 10 IU/L (ref 0–32)
AST: 21 IU/L (ref 0–40)
Albumin: 4.3 g/dL (ref 3.8–4.8)
Alkaline Phosphatase: 73 IU/L (ref 44–121)
BUN/Creatinine Ratio: 18 (ref 12–28)
BUN: 12 mg/dL (ref 8–27)
Bilirubin Total: 0.4 mg/dL (ref 0.0–1.2)
CO2: 26 mmol/L (ref 20–29)
Calcium: 9.6 mg/dL (ref 8.7–10.3)
Chloride: 103 mmol/L (ref 96–106)
Creatinine, Ser: 0.67 mg/dL (ref 0.57–1.00)
Globulin, Total: 2.4 g/dL (ref 1.5–4.5)
Glucose: 88 mg/dL (ref 70–99)
Potassium: 4.1 mmol/L (ref 3.5–5.2)
Sodium: 143 mmol/L (ref 134–144)
Total Protein: 6.7 g/dL (ref 6.0–8.5)
eGFR: 88 mL/min/{1.73_m2} (ref >59)

## 2023-06-03 NOTE — Telephone Encounter (Signed)
 Copied from CRM [id number]. Topic: Clinical - Red Word Triage >> Jun 03, 2023 10:35 AM Higinio Roger wrote: Red Word that prompted transfer to Nurse Triage: Patient fell on 2/26 causing pain to her head. She is wondering if she should wait til her appointment on 07/04/23 or should she be seen sooner for this issue. Patient received cat scan at emergency room. Patient has slight headaches that comes and goes and tingling in face.  Callback #: [phone] Reason for Disposition  Headache    She called wanting to know if it was ok to wait until April to have the MRI done since something was seen on the CT scan of her head.  Answer Assessment - Initial Assessment Questions 1. LOCATION: "Where does it hurt?"      Still having headaches that come and go.   I saw Dr. Allyne Gee yesterday.   She told me and the dr in the ED told me I need to get into the imaging place.  They saw something on the scan.   I've talked with several people this morning.    They called me a little while ago and called for me to make an appt for the exam.   She had April 5, down.   I can go to [facility].   I don't want to go to either of those places if possible.   I'd rather go where I've been going in [place]. Should I wait or get this done now?  I let her know it' already scheduled for her.   It's scheduled for MRI 07/04/2023 12:40 PM  at [facility] on [facility].   No further triage needed.   She just needed to know if it was set up and where.    She mentioned in note she is still having headaches on and off.  2. ONSET: "When did the headache start?" (Minutes, hours or days)      From a fall. Seen in ED and had CT scan done. L  Something was seen so for an MRI now. 3. PATTERN: "Does the pain come and go, or has it been constant since it started?"     Headaches still come and go. I let her know the date and time of the MRI.  No further triage needed 4. SEVERITY: "How bad is the pain?" and "What does it  keep you from doing?"  (e.g., Scale 1-10; mild, moderate, or severe)   - MILD (1-3): doesn't interfere with normal activities    - MODERATE (4-7): interferes with normal activities or awakens from sleep    - SEVERE (8-10): excruciating pain, unable to do any normal activities        Not asked 5. RECURRENT SYMPTOM: "Have you ever had headaches before?" If Yes, ask: "When was the last time?" and "What happened that time?"      Not asked 6. CAUSE: "What do you think is causing the headache?"     From a fall 7. MIGRAINE: "Have you been diagnosed with migraine headaches?" If Yes, ask: "Is this headache similar?"      Not asked 8. HEAD INJURY: "Has there been any recent injury to the head?"      Seen in the ED 9. OTHER SYMPTOMS: "Do you have any other symptoms?" (fever, stiff neck, eye pain, sore throat, cold symptoms)     Headache that comes and goes 10. PREGNANCY: "Is there any chance you are pregnant?" "When was your last menstrual period?"  N/A  Protocols used: [facility]  Chief Complaint: She is scheduled for an MRI of her head on 07/04/2023 at 12:40 at [facility] on [address] [address] [facility].    She wants to know if it's ok to wait that long since they found something on the CT scan indicating she needed the MRI.   She saw Dr. Allyne Gee yesterday 06/02/2023 and an MRI was ordered.    Symptoms: Headache that comes and goes with tingling in her face. Frequency: intermittently Pertinent Negatives: Patient denies N/A Disposition: [] ED /[] Urgent Care (no appt availability in office) / [] Appointment(In office/virtual)/ []  [facility]/ [] Home Care/ [] Refused Recommended Disposition /[] [facility]/ [x]  Follow-up with PCP Additional Notes: I let her know the MRI was scheduled for 07/04/2023 at 12:40 PM per Dr Zella Ball order.   She wasn't sure where it was scheduled.   I let her know.   She was agreeable to going to this location for the scan.    Asking if it's ok to wait that long  to have the scan done.     Message has been sent to Dr. Dorothyann Peng.

## 2023-06-04 ENCOUNTER — Ambulatory Visit: Payer: Self-pay | Admitting: Cardiology

## 2023-06-10 ENCOUNTER — Ambulatory Visit: Payer: 59 | Admitting: Family Medicine

## 2023-06-24 ENCOUNTER — Ambulatory Visit
Admission: RE | Admit: 2023-06-24 | Discharge: 2023-06-24 | Disposition: A | Discharge status: Home / Self Care | Source: Ambulatory Visit | Attending: Internal Medicine | Admitting: Internal Medicine

## 2023-06-24 DIAGNOSIS — R9089 Other abnormal findings on diagnostic imaging of central nervous system: Secondary | ICD-10-CM | POA: Diagnosis not present

## 2023-06-24 DIAGNOSIS — G9389 Other specified disorders of brain: Secondary | ICD-10-CM

## 2023-06-24 DIAGNOSIS — I6782 Cerebral ischemia: Secondary | ICD-10-CM | POA: Diagnosis not present

## 2023-06-24 DIAGNOSIS — R9389 Abnormal findings on diagnostic imaging of other specified body structures: Secondary | ICD-10-CM

## 2023-06-24 MED ORDER — GADOPICLENOL 0.5 MMOL/ML IV SOLN
6.0000 mL | Freq: Once | INTRAVENOUS | Status: AC | PRN
  Administered 2023-06-24: 6 mL via INTRAVENOUS

## 2023-07-04 ENCOUNTER — Other Ambulatory Visit

## 2023-07-05 ENCOUNTER — Encounter: Payer: Self-pay | Admitting: Internal Medicine

## 2023-07-08 ENCOUNTER — Other Ambulatory Visit: Payer: Self-pay | Admitting: Physician Assistant

## 2023-07-08 ENCOUNTER — Other Ambulatory Visit: Payer: Self-pay

## 2023-07-14 ENCOUNTER — Encounter: Payer: Self-pay | Admitting: Internal Medicine

## 2023-07-14 ENCOUNTER — Ambulatory Visit (INDEPENDENT_AMBULATORY_CARE_PROVIDER_SITE_OTHER): Admitting: Internal Medicine

## 2023-07-14 VITALS — BP 122/70 | HR 62 | Temp 97.8°F | Ht 64.0 in | Wt 135.4 lb

## 2023-07-14 DIAGNOSIS — R9089 Other abnormal findings on diagnostic imaging of central nervous system: Secondary | ICD-10-CM

## 2023-07-14 DIAGNOSIS — I119 Hypertensive heart disease without heart failure: Secondary | ICD-10-CM

## 2023-07-14 DIAGNOSIS — M349 Systemic sclerosis, unspecified: Secondary | ICD-10-CM

## 2023-07-14 DIAGNOSIS — R2689 Other abnormalities of gait and mobility: Secondary | ICD-10-CM | POA: Diagnosis not present

## 2023-07-14 DIAGNOSIS — I272 Pulmonary hypertension, unspecified: Secondary | ICD-10-CM | POA: Diagnosis not present

## 2023-07-14 DIAGNOSIS — D329 Benign neoplasm of meninges, unspecified: Secondary | ICD-10-CM

## 2023-07-14 MED ORDER — CYANOCOBALAMIN 1000 MCG/ML IJ SOLN
1000.0000 ug | Freq: Once | INTRAMUSCULAR | Status: AC
  Administered 2023-07-14: 1000 ug via INTRAMUSCULAR

## 2023-07-14 NOTE — Patient Instructions (Addendum)
 Meningioma  A meningioma is an abnormal growth of cells (tumor) that occurs in the meninges. The meninges are the thin tissues that cover the brain and spinal cord. A meningioma is usually not cancer (benign). In rare cases, a meningioma is cancerous (malignant). What are the causes? This condition may be caused by: Being exposed to ionizing radiation. This type of energy occurs naturally in the environment. It also comes from artificial sources, such as from X-rays and some medical devices. Neurofibromatosis 2. This is a genetic disorder that causes many soft tumors. Genetic mutations. These are changes in certain genes. Many times, the cause of this condition is not known. What increases the risk? The following factors may make you more likely to develop this condition: Being exposed to radiation, especially as a child. Being female. There may be a greater risk linked to having female hormones. Older people who are female have a higher risk of meningiomas than people who are female or children. People who are female have a higher risk of malignant meningiomas. Being obese. What are the signs or symptoms? Common symptoms of this condition include: Headaches. Nausea and vomiting. Vision changes. Hearing changes. Loss of the sense of smell. Other symptoms include: Seizures. Weakness or numbness on one side of the body, or in an arm or a leg. Problems with memory or thinking. Mood or personality changes. Symptoms of this condition usually begin very slowly. The symptoms may depend on the size and location of your tumor. How is this diagnosed? This condition is diagnosed based on: Results of brain imaging tests, such as a CT scan or an MRI. Removing a tissue sample to look at it under a microscope (biopsy). This may be done to confirm the diagnosis and to help determine the best treatment for your condition. How is this treated? This condition may be treated with: Medicines, such as  steroids. These may help lessen brain swelling and and improve your symptoms. Radiation therapy. This uses high-energy rays to shrink or kill your tumor. Surgery to remove as much of your tumor as possible. You may not have treatment until your symptoms start to affect your daily activities. This is because meningiomas grow very slowly. Your health care provider may prefer to watch for growth of your benign tumor before starting treatment. Follow these instructions at home: Take over-the-counter and prescription medicines only as told by your health care provider. Follow instructions from your health care provider about what you may eat and drink. Drink enough fluid to keep your urine pale yellow. Return to your normal activities as told by your health care provider. Ask your health care provider what activities are safe for you. Keep all follow-up visits. You may need regular visits with your health care provider to watch the growth of your tumor. Contact a health care provider if: You have symptoms that come back after treatment. You have headaches. You vomit. You have changes in your vision. You are weaker or more tired than usual. Get help right away if: You have sudden changes in vision. You have a seizure or you are told you have had a seizure. You have new weakness or numbness on one side of your body. Your vomiting does not go away, or you cannot eat or drink without vomiting. You have trouble walking. This information is not intended to replace advice given to you by your health care provider. Make sure you discuss any questions you have with your health care provider. Document Revised: 07/22/2021 Document Reviewed:  07/22/2021 Elsevier Patient Education  2024 [employer].

## 2023-07-14 NOTE — Progress Notes (Signed)
 I,Victoria T Emmitt, CMA,acting as a neurosurgeon for Catheryn LOISE Slocumb, MD.,have documented all relevant documentation on the behalf of Catheryn LOISE Slocumb, MD,as directed by  Catheryn LOISE Slocumb, MD while in the presence of Catheryn LOISE Slocumb, MD.  Subjective:  Patient ID: Nancy Lambert , female    DOB: Aug 06, 1940 , 83 y.o.   MRN: [id number]  Chief Complaint  Patient presents with   MRI Results    Patient presents today to further discuss MRI results. She is accompanied by her daughter. She does not have any specific questions. She would like a  confident voice to let her know what is to be expected.     HPI Discussed the use of AI scribe software for clinical note transcription with the patient, who gave verbal consent to proceed.  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 Another daughter attended the visit by phone.   She experienced a fall on May 28, 2023, while getting up from a recliner, which led to a CT scan revealing a soft tissue mass in the brain, suspected to be a meningioma. An MRI confirmed the presence of an enhancing lesion, likely a meningioma, located near the internal carotid artery. Occasional facial tightness is present, but no facial numbness.  She experiences imbalance, described as stumbling when getting up and occasional dizziness without the sensation of the room spinning. She can sit and feel dizzy, and sometimes feels things 'spinning around in her head.' She is concerned about her health but has been hesitant to discuss these issues. She drinks juice and ginger ale, possibly more than she eats, and acknowledges not eating meals regularly.  Her blood pressure readings have been elevated recently, although previous readings were within normal limits. She is currently taking amlodipine  5 mg every other day due to dizziness  when taking it daily.  She has a history of scleroderma and sees a rheumatologist regularly. Her last visit was in September 2024, and she is due for a follow-up. She declined a lung function test previously. She also sees another specialist regularly.  No recent increase in fatigue, attributing any fatigue to 'plain laziness.'   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HCC)    Pulmonary hypertension (HCC)    Scleroderma (HCC)    Status post ablation of incompetent vein using laser bilateral leg 01/2019     Family History  Problem Relation Age of Onset   Breast cancer Mother    Clotting disorder Mother    Diabetes Mother    Other Father        drowned   Breast cancer Sister    Clotting disorder Sister    Breast cancer Sister    Breast cancer Daughter    Prostate cancer Son    Breast cancer Other    Colon cancer Neg Hx    Colon polyps Neg Hx    Esophageal cancer Neg Hx    Rectal cancer Neg Hx    Stomach cancer Neg Hx      Current Outpatient Medications:    aspirin EC 81 MG tablet, Take 81 mg by mouth daily., Disp: , Rfl:    losartan -hydrochlorothiazide (HYZAAR) 50-12.5 MG tablet, Take 1 tablet by mouth every morning., Disp:  30 tablet, Rfl: 2   mirtazapine  (REMERON ) 7.5 MG tablet, Take 1 tablet (7.5 mg total) by mouth at bedtime., Disp: 30 tablet, Rfl: 1   omeprazole  (PRILOSEC) 40 MG capsule, Take 1 capsule (40 mg total) by mouth every morning. Please advise patient needs an office visit for further refills, Disp: 30 capsule, Rfl: 0   Riociguat  (ADEMPAS ) 2 MG TABS, Take 1 tablet by mouth 3 (three) times daily., Disp: 270 tablet, Rfl: 3   amLODipine  (NORVASC ) 5 MG tablet, Take 1 tablet (5 mg total) by mouth daily. (Patient taking differently: Take 5 mg by mouth every other day.), Disp: 180 tablet, Rfl: 3   No Known Allergies   Review of Systems   Constitutional: Negative.   Respiratory: Negative.    Cardiovascular: Negative.   Gastrointestinal: Negative.   Neurological: Negative.   Psychiatric/Behavioral: Negative.       Today's Vitals   07/14/23 1029 07/14/23 1041  BP: (!) 140/68 122/70  Pulse: 62   Temp: 97.8 F (36.6 C)   SpO2: 98%   Weight: 135 lb 6.4 oz (61.4 kg)   Height: 5' 4 (1.626 m)    Body mass index is 23.24 kg/m.  Wt Readings from Last 3 Encounters:  07/14/23 135 lb 6.4 oz (61.4 kg)  06/02/23 130 lb 9.6 oz (59.2 kg)  06/01/23 128 lb (58.1 kg)    BP Readings from Last 3 Encounters:  07/14/23 122/70  06/02/23 110/72  06/01/23 124/68     Objective:  Physical Exam Vitals and nursing note reviewed.  Constitutional:      Appearance: Normal appearance.  HENT:     Head: Normocephalic and atraumatic.     Right Ear: Tympanic membrane, ear canal and external ear normal. There is no impacted cerumen.     Left Ear: Tympanic membrane, ear canal and external ear normal. There is no impacted cerumen.  Eyes:     Extraocular Movements: Extraocular movements intact.  Cardiovascular:     Rate and Rhythm: Normal rate and regular rhythm.     Heart sounds: Murmur heard.  Pulmonary:     Effort: Pulmonary effort is normal.     Breath sounds: Normal breath sounds.  Musculoskeletal:     Cervical back: Normal range of motion.  Skin:    General: Skin is warm.  Neurological:     General: No focal deficit present.     Mental Status: She is alert.  Psychiatric:        Mood and Affect: Mood normal.        Behavior: Behavior normal.    IMAGING: MRI HEAD WITHOUT AND WITH CONTRAST   TECHNIQUE: Multiplanar, multiecho pulse sequences of the brain and surrounding structures were obtained without and with intravenous contrast.   CONTRAST:  6 cc Vueway    COMPARISON:  Head CT 05/28/2023   FINDINGS: Brain: Diffusion imaging does not show any acute or subacute infarction or other cause of restricted diffusion. No  focal abnormality affects the brainstem or cerebellum. Cerebral hemispheres show advanced chronic small-vessel ischemic changes of the deep and subcortical white matter. Old small vessel insults also affect the thalami and basal ganglia. No cortical or large vessel territory stroke is seen. No intra-axial mass lesion, hemorrhage, hydrocephalus or extra-axial collection.   The patient does have an enhancing mass lesion involving the left cavernous sinus and medial aspect of the middle cranial fossa quite likely to represent a meningioma. This measures approximately 2.3 x 2.3 x 1.1 cm. This could result in cranial  nerve symptoms on the left. Partial encasement of left ICA is noted.   Vascular: Major vessels at the base of the brain show flow.   Skull and upper cervical spine: Negative   Sinuses/Orbits: Clear/normal   Other: None   IMPRESSION: 1. 2.3 x 2.3 x 1.1 cm enhancing mass lesion involving the left cavernous sinus and medial aspect of the middle cranial fossa, quite likely to represent a meningioma. This could result in cranial nerve symptoms on the left. Partial encasement of the left ICA is noted. 2. Advanced chronic small-vessel ischemic changes of the cerebral hemispheric white matter, thalami and basal ganglia.     Electronically Signed   By: Oneil Officer M.D.   On: 07/02/2023 15:02      Assessment And Plan:  Meningioma Nancy Lambert) Assessment & Plan: MRI confirmed an enhancing lesion likely a meningioma near the internal carotid artery. Surgery may not recommended due to location risk. Occasional facial tightness reported. - Refer to Dr. Rockey Peru, neurosurgeon, for further evaluation and management.  Orders: -     Ambulatory referral to Neurosurgery  Imbalance Assessment & Plan: Episodes of imbalance and dizziness possibly related to low B12, dehydration, or ear issues. Thyroid  function normal last year. - Check B12 levels to rule out deficiency. - Advise on  adequate hydration. - Examine ears for wax buildup, neg on exam -Would likely benefit from physical therapy - will consider after neurosurgical evaluation  Orders: -     Vitamin B12 -     Methylmalonic acid, serum -     Cyanocobalamin   Scleroderma (HCC) Assessment & Plan: Condition previously well-managed under rheumatologist Dr. Curt. Last visit was over a year ago.  She agrees to referral for f/u appt.   - Ensure follow-up with Dr. Aryal for ongoing management.  Orders: -     Ambulatory referral to Rheumatology  Hypertensive heart disease without heart failure Assessment & Plan: Blood pressure elevated during visit, attributed to stress from MRI discussion. Previous readings within target range. - Monitor blood pressure at home 2-3 times a week, especially when feeling unwell.   Pulmonary hypertension (HCC) Assessment & Plan: Chronic.  Per Cardiology, she has responded well to riociguat . Most recent Cardiology notes reviewed in detail. She does not endorse worsening sob and/or LE edema.     Return if symptoms worsen or fail to improve.  Patient was given opportunity to ask questions. Patient verbalized understanding of the plan and was able to repeat key elements of the plan. All questions were answered to their satisfaction.   I, Catheryn LOISE Slocumb, MD, have reviewed all documentation for this visit. The documentation on 07/14/23 for the exam, diagnosis, procedures, and orders are all accurate and complete.   IF YOU HAVE BEEN REFERRED TO A SPECIALIST, IT MAY TAKE 1-2 WEEKS TO SCHEDULE/PROCESS THE REFERRAL. IF YOU HAVE NOT HEARD FROM US /SPECIALIST IN TWO WEEKS, PLEASE GIVE US  A CALL AT [phone].   THE PATIENT IS ENCOURAGED TO PRACTICE SOCIAL DISTANCING DUE TO THE COVID-19 PANDEMIC.

## 2023-07-16 DIAGNOSIS — R2689 Other abnormalities of gait and mobility: Secondary | ICD-10-CM | POA: Insufficient documentation

## 2023-07-16 DIAGNOSIS — D329 Benign neoplasm of meninges, unspecified: Secondary | ICD-10-CM | POA: Insufficient documentation

## 2023-07-16 LAB — METHYLMALONIC ACID, SERUM: Methylmalonic Acid: 295 nmol/L (ref 0–378)

## 2023-07-16 LAB — VITAMIN B12: Vitamin B-12: 466 pg/mL (ref 232–1245)

## 2023-07-16 NOTE — Assessment & Plan Note (Signed)
 MRI confirmed an enhancing lesion likely a meningioma near the internal carotid artery. Surgery may not recommended due to location risk. Occasional facial tightness reported. - Refer to Dr. Audie Bleacher, neurosurgeon, for further evaluation and management.

## 2023-07-16 NOTE — Assessment & Plan Note (Signed)
 Episodes of imbalance and dizziness possibly related to low B12, dehydration, or ear issues. Thyroid function normal last year. - Check B12 levels to rule out deficiency. - Advise on adequate hydration. - Examine ears for wax buildup, neg on exam -Would likely benefit from physical therapy - will consider after neurosurgical evaluation

## 2023-07-16 NOTE — Assessment & Plan Note (Signed)
 Condition previously well-managed under rheumatologist Dr. Bascom Lily. Last visit was over a year ago.  She agrees to referral for f/u appt.   - Ensure follow-up with Dr. Aryal for ongoing management.

## 2023-07-16 NOTE — Assessment & Plan Note (Signed)
 Blood pressure elevated during visit, attributed to stress from MRI discussion. Previous readings within target range. - Monitor blood pressure at home 2-3 times a week, especially when feeling unwell.

## 2023-07-16 NOTE — Assessment & Plan Note (Signed)
Chronic.  Per Cardiology, she has responded well to riociguat. Most recent Cardiology notes reviewed in detail. She does not endorse worsening sob and/or LE edema.

## 2023-07-21 ENCOUNTER — Encounter: Payer: Self-pay | Admitting: Internal Medicine

## 2023-07-23 DIAGNOSIS — M79641 Pain in right hand: Secondary | ICD-10-CM | POA: Diagnosis not present

## 2023-07-23 DIAGNOSIS — M349 Systemic sclerosis, unspecified: Secondary | ICD-10-CM | POA: Diagnosis not present

## 2023-07-23 DIAGNOSIS — R600 Localized edema: Secondary | ICD-10-CM | POA: Diagnosis not present

## 2023-07-23 DIAGNOSIS — M79642 Pain in left hand: Secondary | ICD-10-CM | POA: Diagnosis not present

## 2023-07-23 DIAGNOSIS — M79671 Pain in right foot: Secondary | ICD-10-CM | POA: Diagnosis not present

## 2023-07-23 DIAGNOSIS — K219 Gastro-esophageal reflux disease without esophagitis: Secondary | ICD-10-CM | POA: Diagnosis not present

## 2023-07-23 DIAGNOSIS — M79672 Pain in left foot: Secondary | ICD-10-CM | POA: Diagnosis not present

## 2023-07-23 DIAGNOSIS — I7300 Raynaud's syndrome without gangrene: Secondary | ICD-10-CM | POA: Diagnosis not present

## 2023-07-23 DIAGNOSIS — R0609 Other forms of dyspnea: Secondary | ICD-10-CM | POA: Diagnosis not present

## 2023-07-24 ENCOUNTER — Other Ambulatory Visit (HOSPITAL_COMMUNITY): Payer: Self-pay | Admitting: Rheumatology

## 2023-07-24 DIAGNOSIS — R0609 Other forms of dyspnea: Secondary | ICD-10-CM

## 2023-07-24 DIAGNOSIS — M349 Systemic sclerosis, unspecified: Secondary | ICD-10-CM

## 2023-07-27 ENCOUNTER — Other Ambulatory Visit (HOSPITAL_COMMUNITY): Payer: Self-pay | Admitting: Respiratory Therapy

## 2023-07-27 DIAGNOSIS — M349 Systemic sclerosis, unspecified: Secondary | ICD-10-CM

## 2023-07-27 DIAGNOSIS — R0609 Other forms of dyspnea: Secondary | ICD-10-CM

## 2023-07-27 DIAGNOSIS — J849 Interstitial pulmonary disease, unspecified: Secondary | ICD-10-CM

## 2023-08-07 ENCOUNTER — Ambulatory Visit
Admission: RE | Admit: 2023-08-07 | Discharge: 2023-08-07 | Disposition: A | Discharge status: Home / Self Care | Payer: 59 | Source: Ambulatory Visit | Attending: Internal Medicine | Admitting: Internal Medicine

## 2023-08-07 ENCOUNTER — Encounter: Payer: Self-pay | Admitting: Internal Medicine

## 2023-08-07 DIAGNOSIS — M85851 Other specified disorders of bone density and structure, right thigh: Secondary | ICD-10-CM

## 2023-08-07 DIAGNOSIS — M8588 Other specified disorders of bone density and structure, other site: Secondary | ICD-10-CM | POA: Diagnosis not present

## 2023-08-10 DIAGNOSIS — D329 Benign neoplasm of meninges, unspecified: Secondary | ICD-10-CM | POA: Diagnosis not present

## 2023-09-20 ENCOUNTER — Other Ambulatory Visit: Payer: Self-pay | Admitting: Physician Assistant

## 2023-09-25 ENCOUNTER — Other Ambulatory Visit: Payer: Self-pay | Admitting: Physician Assistant

## 2023-09-28 ENCOUNTER — Other Ambulatory Visit: Payer: Self-pay | Admitting: Physician Assistant

## 2023-10-07 DIAGNOSIS — H0288A Meibomian gland dysfunction right eye, upper and lower eyelids: Secondary | ICD-10-CM | POA: Diagnosis not present

## 2023-10-07 DIAGNOSIS — R519 Headache, unspecified: Secondary | ICD-10-CM | POA: Diagnosis not present

## 2023-10-07 DIAGNOSIS — H25813 Combined forms of age-related cataract, bilateral: Secondary | ICD-10-CM | POA: Diagnosis not present

## 2023-10-07 DIAGNOSIS — H04123 Dry eye syndrome of bilateral lacrimal glands: Secondary | ICD-10-CM | POA: Diagnosis not present

## 2023-10-07 DIAGNOSIS — H0288B Meibomian gland dysfunction left eye, upper and lower eyelids: Secondary | ICD-10-CM | POA: Diagnosis not present

## 2023-10-07 DIAGNOSIS — H10413 Chronic giant papillary conjunctivitis, bilateral: Secondary | ICD-10-CM | POA: Diagnosis not present

## 2023-10-07 DIAGNOSIS — H35372 Puckering of macula, left eye: Secondary | ICD-10-CM | POA: Diagnosis not present

## 2023-10-09 DIAGNOSIS — R519 Headache, unspecified: Secondary | ICD-10-CM | POA: Diagnosis not present

## 2023-10-22 ENCOUNTER — Other Ambulatory Visit: Payer: Self-pay | Admitting: Physician Assistant

## 2023-10-23 ENCOUNTER — Telehealth: Payer: Self-pay | Admitting: *Deleted

## 2023-10-23 MED ORDER — OMEPRAZOLE 40 MG PO CPDR: 40.0000 mg | DELAYED_RELEASE_CAPSULE | Freq: Every morning | ORAL | 1 refills | Status: AC

## 2023-10-23 NOTE — Telephone Encounter (Signed)
 Received refill request from pharmacy for Omeprazole  patient was last seen in 01/2022. 1 1/2 years ago.   Patient has a follow up 10/21 to see Dr Shila for GERD

## 2023-12-08 ENCOUNTER — Ambulatory Visit: Payer: Self-pay | Admitting: Internal Medicine

## 2023-12-08 ENCOUNTER — Encounter: Payer: Self-pay | Admitting: Internal Medicine

## 2023-12-08 ENCOUNTER — Ambulatory Visit: Payer: 59 | Admitting: Internal Medicine

## 2023-12-08 VITALS — BP 138/70 | HR 55 | Temp 98.3°F | Ht 64.0 in | Wt 138.6 lb

## 2023-12-08 DIAGNOSIS — M349 Systemic sclerosis, unspecified: Secondary | ICD-10-CM

## 2023-12-08 DIAGNOSIS — D329 Benign neoplasm of meninges, unspecified: Secondary | ICD-10-CM

## 2023-12-08 DIAGNOSIS — Z23 Encounter for immunization: Secondary | ICD-10-CM

## 2023-12-08 DIAGNOSIS — Z0000 Encounter for general adult medical examination without abnormal findings: Secondary | ICD-10-CM | POA: Diagnosis not present

## 2023-12-08 DIAGNOSIS — E876 Hypokalemia: Secondary | ICD-10-CM

## 2023-12-08 DIAGNOSIS — I119 Hypertensive heart disease without heart failure: Secondary | ICD-10-CM

## 2023-12-08 LAB — POCT URINALYSIS DIP (CLINITEK)
Bilirubin, UA: NEGATIVE
Glucose, UA: NEGATIVE mg/dL
Ketones, POC UA: NEGATIVE mg/dL
Nitrite, UA: NEGATIVE
POC PROTEIN,UA: NEGATIVE
Spec Grav, UA: 1.02 (ref 1.010–1.025)
Urobilinogen, UA: 0.2 U/dL
pH, UA: 5.5 (ref 5.0–8.0)

## 2023-12-08 NOTE — Progress Notes (Signed)
 I,Victoria T Emmitt, CMA,acting as a Neurosurgeon for Catheryn LOISE Slocumb, MD.,have documented all relevant documentation on the behalf of Catheryn LOISE Slocumb, MD,as directed by  Catheryn LOISE Slocumb, MD while in the presence of Catheryn LOISE Slocumb, MD.  Subjective:    Patient ID: Nancy Lambert , female    DOB: February 12, 1941 , 83 y.o.   MRN: [id number]  Chief Complaint  Patient presents with  . Annual Exam    Patient presents today for annual exam. She reports compliance with medications. Denies headache, chest pain & sob.  She has not yet completed colonoscopy.   . Hypertension    HPI Discussed the use of AI scribe software for clinical note transcription with the patient, who gave verbal consent to proceed.  History of Present Illness Nancy Lambert is an 83 year old female who presents for a physical exam and medication review.  She has not been taking her prescribed medications regularly. She has not taken amlodipine  for at least two to three months due to dizziness when taken daily. She also has not been taking losartan , Remeron , or omeprazole . She is currently taking methotrexate and Adempas .  Her energy level is described as 'okay', and she attributes some of her activity to caring for her great-grandchildren, aged nine months and four years. She exercises by going up and down stairs and occasionally going to the park.  Regarding bowel habits, she reports alternating between constipation and diarrhea, stating 'I've always been both ways really.' No blood in stools has been noticed.  She wants to know her blood type, feeling 'stupid' for not knowing it at her age, although she acknowledges that most people do not know their blood type unless they need a transfusion.   Hypertension This is a chronic problem. The current episode started more than 1 year ago. The problem has been gradually improving since onset. The problem is controlled. Risk factors for coronary artery disease include  post-menopausal state, sedentary lifestyle and dyslipidemia. Past treatments include ACE inhibitors. There is no history of kidney disease or CAD/MI.  Hyperlipidemia This is a chronic problem. The current episode started more than 1 year ago. The problem is uncontrolled. She has no history of diabetes or liver disease. She is currently on no antihyperlipidemic treatment. The current treatment provides moderate improvement of lipids. Risk factors for coronary artery disease include hypertension, post-menopausal, a sedentary lifestyle and dyslipidemia.     Past Medical History:  Diagnosis Date  . Anxiety   . Cardiac arrhythmia   . Glaucoma   . Hyperlipidemia   . Hypertension   . 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HCC)   . Pulmonary hypertension (HCC)   . Scleroderma (HCC)   . 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 Breast cancer Mother   . Clotting disorder Mother   . Diabetes Mother   . Other Father        drowned  . Breast cancer Sister   . Clotting disorder Sister   . Breast cancer Sister   . Breast cancer Daughter   . Prostate cancer Son   . Breast cancer Other   . Colon cancer Neg Hx   . Colon polyps Neg Hx   . Esophageal cancer Neg Hx   . Rectal cancer Neg Hx   . Stomach cancer Neg Hx      Current Outpatient Medications:  .  amLODipine  (NORVASC ) 5 MG  tablet, Take 1 tablet (5 mg total) by mouth daily. (Patient taking differently: Take 5 mg by mouth every other day.), Disp: 180 tablet, Rfl: 3 .  aspirin EC 81 MG tablet, Take 81 mg by mouth daily., Disp: , Rfl:  .  losartan -hydrochlorothiazide (HYZAAR) 50-12.5 MG tablet, Take 1 tablet by mouth every morning., Disp: 30 tablet, Rfl: 2 .  mirtazapine  (REMERON ) 7.5 MG tablet, Take 1 tablet (7.5 mg total) by mouth at bedtime., Disp: 30 tablet, Rfl: 1 .  omeprazole  (PRILOSEC) 40 MG capsule, Take 1 capsule (40 mg total) by mouth every  morning. Please advise patient needs an office visit for further refills, Disp: 30 capsule, Rfl: 1 .  Riociguat  (ADEMPAS ) 2 MG TABS, Take 1 tablet by mouth 3 (three) times daily., Disp: 270 tablet, Rfl: 3   No Known Allergies    The patient states she uses post menopausal status for birth control. No LMP recorded. Patient is postmenopausal.. Negative for Dysmenorrhea. Negative for: breast discharge, breast lump(s), breast pain and breast self exam. Associated symptoms include abnormal vaginal bleeding. Pertinent negatives include abnormal bleeding (hematology), anxiety, decreased libido, depression, difficulty falling sleep, dyspareunia, history of infertility, nocturia, sexual dysfunction, sleep disturbances, urinary incontinence, urinary urgency, vaginal discharge and vaginal itching. Diet regular.The patient states her exercise level is  intermittent.  . The patient's tobacco use is:  Social History   Tobacco Use  Smoking Status Former  . Current packs/day: 0.00  . Average packs/day: 0.3 packs/day for 5.0 years (1.3 ttl pk-yrs)  . Types: Cigarettes  . Start date: 1966  . Quit date: 38  . Years since quitting: 54.7  Smokeless Tobacco Never  Tobacco Comments   very light use when she did smoke  . She has been exposed to passive smoke. The patient's alcohol use is:  Social History   Substance and Sexual Activity  Alcohol Use Yes  . Alcohol/week: 3.0 - 5.0 standard drinks of alcohol  . Types: 3 - 5 Standard drinks or equivalent per week   Comment: occasional   Review of Systems  Constitutional: Negative.   HENT: Negative.    Eyes: Negative.   Respiratory: Negative.    Cardiovascular: Negative.   Gastrointestinal: Negative.   Endocrine: Negative.   Genitourinary: Negative.   Musculoskeletal: Negative.   Skin: Negative.   Allergic/Immunologic: Negative.   Neurological: Negative.   Hematological: Negative.   Psychiatric/Behavioral: Negative.       Today's Vitals    12/08/23 0931 12/08/23 0943  BP: (!) 142/80 138/70  Pulse: (!) 55   Temp: 98.3 F (36.8 C)   SpO2: 98%   Weight: 138 lb 9.6 oz (62.9 kg)   Height: 5' 4 (1.626 m)    Body mass index is 23.79 kg/m.  Wt Readings from Last 3 Encounters:  12/08/23 138 lb 9.6 oz (62.9 kg)  07/14/23 135 lb 6.4 oz (61.4 kg)  06/02/23 130 lb 9.6 oz (59.2 kg)     Objective:  Physical Exam      Assessment And Plan:     Encounter for annual health examination  Hypertensive heart disease without heart failure -     POCT URINALYSIS DIP (CLINITEK) -     Microalbumin / creatinine urine ratio -     EKG 12-Lead  Meningioma (HCC)  Scleroderma (HCC)  Hypokalemia  Immunization due -     Flu vaccine HIGH DOSE PF(Fluzone Trivalent)   Assessment & Plan Adult Wellness Visit Routine check-up with elevated blood pressure, possibly stress-related. Reports of  constipation and occasional diarrhea. Inconsistent medication adherence noted. - Check blood type. - Perform liver and kidney function tests. - Perform cholesterol test. - Review and adjust medication regimen. - Encourage consistent medication adherence.  Benign neoplasm of meninges No surgery planned due to lack of growth and symptoms. - Regular monitoring every six months unless symptoms develop.  Hypertensive heart disease Suboptimal blood pressure management due to inconsistent medication adherence, increasing cardiovascular risk. - Renew amlodipine  at a lower dose for daily use. - Instruct to take losartan  every morning. - Emphasized importance of regular blood pressure medication adherence to prevent heart attack or stroke.  Return for 1 year HM, 3 MONTH BPC.. Patient was given opportunity to ask questions. Patient verbalized understanding of the plan and was able to repeat key elements of the plan. All questions were answered to their satisfaction.   I, Catheryn LOISE Slocumb, MD, have reviewed all documentation for this visit. The  documentation on 12/08/23 for the exam, diagnosis, procedures, and orders are all accurate and complete.

## 2023-12-08 NOTE — Patient Instructions (Signed)

## 2023-12-09 LAB — MICROALBUMIN / CREATININE URINE RATIO
Creatinine, Urine: 87.2 mg/dL
Microalb/Creat Ratio: 34 mg/g{creat} — ABNORMAL HIGH (ref 0–29)
Microalbumin, Urine: 29.5 ug/mL

## 2023-12-09 LAB — CMP14+EGFR
ALT: 18 IU/L (ref 0–32)
AST: 30 IU/L (ref 0–40)
Albumin: 4.6 g/dL (ref 3.7–4.7)
Alkaline Phosphatase: 88 IU/L (ref 44–121)
BUN/Creatinine Ratio: 23 (ref 12–28)
BUN: 16 mg/dL (ref 8–27)
Bilirubin Total: 0.4 mg/dL (ref 0.0–1.2)
CO2: 22 mmol/L (ref 20–29)
Calcium: 10 mg/dL (ref 8.7–10.3)
Chloride: 103 mmol/L (ref 96–106)
Creatinine, Ser: 0.71 mg/dL (ref 0.57–1.00)
Globulin, Total: 2.5 g/dL (ref 1.5–4.5)
Glucose: 86 mg/dL (ref 70–99)
Potassium: 4.1 mmol/L (ref 3.5–5.2)
Sodium: 143 mmol/L (ref 134–144)
Total Protein: 7.1 g/dL (ref 6.0–8.5)
eGFR: 85 mL/min/1.73 (ref >59)

## 2023-12-09 LAB — CBC
Hematocrit: 39.8 % (ref 34.0–46.6)
Hemoglobin: 12.5 g/dL (ref 11.1–15.9)
MCH: 30.2 pg (ref 26.6–33.0)
MCHC: 31.4 g/dL — ABNORMAL LOW (ref 31.5–35.7)
MCV: 96 fL (ref 79–97)
Platelets: 287 x10E3/uL (ref 150–450)
RBC: 4.14 x10E6/uL (ref 3.77–5.28)
RDW: 12.8 % (ref 11.7–15.4)
WBC: 3.7 x10E3/uL (ref 3.4–10.8)

## 2023-12-09 LAB — LIPID PANEL
Chol/HDL Ratio: 2.7 ratio (ref 0.0–4.4)
Cholesterol, Total: 189 mg/dL (ref 100–199)
HDL: 71 mg/dL (ref >39)
LDL Chol Calc (NIH): 106 mg/dL — ABNORMAL HIGH (ref 0–99)
Triglycerides: 65 mg/dL (ref 0–149)
VLDL Cholesterol Cal: 12 mg/dL (ref 5–40)

## 2023-12-09 LAB — ABO AND RH: Rh Factor: POSITIVE

## 2023-12-11 ENCOUNTER — Other Ambulatory Visit: Payer: Self-pay

## 2023-12-11 MED ORDER — ADEMPAS 2 MG PO TABS: 1.0000 | ORAL_TABLET | Freq: Three times a day (TID) | ORAL | 1 refills | Status: AC

## 2023-12-20 NOTE — Assessment & Plan Note (Addendum)
 A full exam was performed.  Importance of monthly self breast exams was discussed with the patient.  She is advised to get 30-45 minutes of regular exercise, no less than four to five days per week. Both weight-bearing and aerobic exercises are recommended.  She is advised to follow a healthy diet with at least six fruits/veggies per day, decrease intake of red meat and other saturated fats and to increase fish intake to twice weekly.  Meats/fish should not be fried -- baked, boiled or broiled is preferable. It is also important to cut back on your sugar intake.  Be sure to read labels - try to avoid anything with added sugar, high fructose corn syrup or other sweeteners.  If you must use a sweetener, you can try stevia or monkfruit.  It is also important to avoid artificially sweetened foods/beverages and diet drinks. Lastly, wear SPF 50 sunscreen on exposed skin and when in direct sunlight for an extended period of time.  Be sure to avoid fast food restaurants and aim for at least 60 ounces of water daily.    - Check blood type. - Perform liver and kidney function tests. - Perform cholesterol test. - Review and adjust medication regimen. - Encourage consistent medication adherence.

## 2023-12-20 NOTE — Assessment & Plan Note (Addendum)
 Suboptimal blood pressure management due to inconsistent medication adherence, increasing cardiovascular risk. EKG performed, SB w/ -occasional ectopic ventricular beat, LAFB, poor R wave progression, nonspecific T abnormality.  - Renew amlodipine  at a lower dose for daily use. - Instruct to take losartan  every morning. - Emphasized importance of regular blood pressure medication adherence to prevent heart attack or stroke. - Follow low sodium diet.

## 2023-12-20 NOTE — Assessment & Plan Note (Addendum)
 Neurosurgical notes reviewed. No surgery planned due to lack of growth and symptoms. - Regular monitoring every six months unless symptoms develop.

## 2023-12-20 NOTE — Assessment & Plan Note (Addendum)
 Condition previously well-managed under rheumatologist Dr. Curt. Most recent notes reviewed - Ensure follow-up with Dr. Curt for ongoing management. - Follow anti-inflammatory meal plan

## 2024-01-13 ENCOUNTER — Ambulatory Visit

## 2024-01-13 VITALS — BP 126/80 | HR 64 | Temp 98.3°F | Ht 64.0 in | Wt 146.0 lb

## 2024-01-13 DIAGNOSIS — Z0000 Encounter for general adult medical examination without abnormal findings: Secondary | ICD-10-CM | POA: Diagnosis not present

## 2024-01-13 NOTE — Patient Instructions (Addendum)
 Ms. Nancy Lambert,  Thank you for taking the time for your Medicare Wellness Visit. I appreciate your continued commitment to your health goals. Please review the care plan we discussed, and feel free to reach out if I can assist you further.  Medicare recommends these wellness visits once per year to help you and your care team stay ahead of potential health issues. These visits are designed to focus on prevention, allowing your provider to concentrate on managing your acute and chronic conditions during your regular appointments.  Please note that Annual Wellness Visits do not include a physical exam. Some assessments may be limited, especially if the visit was conducted virtually. If needed, we may recommend a separate in-person follow-up with your provider.  Ongoing Care Seeing your primary care provider every 3 to 6 months helps us  monitor your health and provide consistent, personalized care.   Referrals If a referral was made during today's visit and you haven't received any updates within two weeks, please contact the referred provider directly to check on the status.  Recommended Screenings:  Health Maintenance  Topic Date Due   COVID-19 Vaccine (5 - 2025-26 season) 01/29/2024*   Zoster (Shingles) Vaccine (1 of 2) 03/08/2024*   Medicare Annual Wellness Visit  01/12/2025   Pneumococcal Vaccine for age over 5  Completed   Flu Shot  Completed   DEXA scan (bone density measurement)  Completed   Meningitis B Vaccine  Aged Out   DTaP/Tdap/Td vaccine  Discontinued   Colon Cancer Screening  Discontinued   Hepatitis C Screening  Discontinued  *Topic was postponed. The date shown is not the original due date.       01/13/2024   10:23 AM  Advanced Directives  Does Patient Have a Medical Advance Directive? No  Would patient like information on creating a medical advance directive? No - Patient declined   Advance Care Planning is important because it: Ensures you receive medical care that  aligns with your values, goals, and preferences. Provides guidance to your family and loved ones, reducing the emotional burden of decision-making during critical moments.  Vision: Annual vision screenings are recommended for early detection of glaucoma, cataracts, and diabetic retinopathy. These exams can also reveal signs of chronic conditions such as diabetes and high blood pressure.  Dental: Annual dental screenings help detect early signs of oral cancer, gum disease, and other conditions linked to overall health, including heart disease and diabetes.  Please see the attached documents for additional preventive care recommendations.

## 2024-01-13 NOTE — Progress Notes (Signed)
 Subjective:   Nancy Lambert is a 83 y.o. who presents for a Medicare Wellness preventive visit.  As a reminder, Annual Wellness Visits don't include a physical exam, and some assessments may be limited, especially if this visit is performed virtually. We may recommend an in-person follow-up visit with your provider if needed.  Visit Complete: In person    Persons Participating in Visit: Patient.  AWV Questionnaire: No: Patient Medicare AWV questionnaire was not completed prior to this visit.  Cardiac Risk Factors include: advanced age (>21men, >52 women);hypertension     Objective:    Today's Vitals   01/13/24 1011  BP: 126/80  Pulse: 64  Temp: 98.3 F (36.8 C)  TempSrc: Oral  Weight: 146 lb (66.2 kg)  Height: 5' 4 (1.626 m)   Body mass index is 25.06 kg/m.     01/13/2024   10:23 AM 05/28/2023    4:38 AM 04/09/2023    1:07 PM 01/01/2023    3:53 PM 12/25/2021   12:40 PM 07/23/2021    9:47 PM 12/13/2020   10:10 AM  Advanced Directives  Does Patient Have a Medical Advance Directive? No No No No No No No  Would patient like information on creating a medical advance directive? No - Patient declined  No - Patient declined No - Patient declined       Current Medications (verified) Outpatient Encounter Medications as of 01/13/2024  Medication Sig   aspirin EC 81 MG tablet Take 81 mg by mouth daily.   Riociguat  (ADEMPAS ) 2 MG TABS Take 1 tablet by mouth 3 (three) times daily.   amLODipine  (NORVASC ) 5 MG tablet Take 1 tablet (5 mg total) by mouth daily. (Patient not taking: Reported on 01/13/2024)   losartan -hydrochlorothiazide (HYZAAR) 50-12.5 MG tablet Take 1 tablet by mouth every morning. (Patient not taking: Reported on 01/13/2024)   mirtazapine  (REMERON ) 7.5 MG tablet Take 1 tablet (7.5 mg total) by mouth at bedtime. (Patient not taking: Reported on 01/13/2024)   omeprazole  (PRILOSEC) 40 MG capsule Take 1 capsule (40 mg total) by mouth every morning. Please  advise patient needs an office visit for further refills (Patient not taking: Reported on 01/13/2024)   No facility-administered encounter medications on file as of 01/13/2024.    Allergies (verified) Patient has no known allergies.   History: 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HCC)    Pulmonary hypertension (HCC)    Scleroderma (HCC)    Status post ablation of incompetent vein using laser bilateral leg 01/2019   Past Surgical History:  Procedure Laterality Date   CARDIAC CATHETERIZATION N/A 07/24/2015   Procedure: Right Heart Cath;  Surgeon: Gordy Bergamo, MD;  Location: [facility] INVASIVE CV LAB;  Service: Cardiovascular;  Laterality: N/A;   COLONOSCOPY     steroid shots  2020   for pain   VESICOVAGINAL FISTULA CLOSURE W/ TAH     Family History  Problem Relation Age of Onset   Breast cancer Mother    Clotting disorder Mother    Diabetes Mother    Other Father        drowned   Breast cancer Sister    Clotting disorder Sister    Breast cancer Sister    Breast cancer Daughter    Prostate cancer Son    Breast cancer Other    Colon cancer Neg Hx  Colon polyps Neg Hx    Esophageal cancer Neg Hx    Rectal cancer Neg Hx    Stomach cancer Neg Hx    Social History   Socioeconomic History   Marital status: Widowed    Spouse name: Not on file   Number of children: 4   Years of education: Not on file   Highest education level: Not on file  Occupational History   Occupation: Retired  Tobacco Use   Smoking status: Former    Current packs/day: 0.00    Average packs/day: 0.3 packs/day for 5.0 years (1.3 ttl pk-yrs)    Types: Cigarettes    Start date: 1966    Quit date: 1971    Years since quitting: 54.8   Smokeless tobacco: Never   Tobacco comments:    very light use when she did smoke  Vaping Use   Vaping status: Never Used  Substance and Sexual  Activity   Alcohol use: Not Currently    Alcohol/week: 3.0 - 5.0 standard drinks of alcohol    Types: 3 - 5 Standard drinks or equivalent per week    Comment: occasional   Drug use: No   Sexual activity: Not Currently  Other Topics Concern   Not on file  Social History Narrative   Lives at home with her daughter   Right handed   Social Drivers of Health   Financial Resource Strain: Low Risk  (01/13/2024)   Overall Financial Resource Strain (CARDIA)    Difficulty of Paying Living Expenses: Not hard at all  Food Insecurity: No Food Insecurity (01/13/2024)   Hunger Vital Sign    Worried About Running Out of Food in the Last Year: Never true    Ran Out of Food in the Last Year: Never true  Transportation Needs: No Transportation Needs (01/13/2024)   PRAPARE - Administrator, Civil Service (Medical): No    Lack of Transportation (Non-Medical): No  Physical Activity: Inactive (01/13/2024)   Exercise Vital Sign    Days of Exercise per Week: 0 days    Minutes of Exercise per Session: 0 min  Stress: No Stress Concern Present (01/13/2024)   [employer] of Occupational Health - Occupational Stress Questionnaire    Feeling of Stress: Only a little  Social Connections: Socially Isolated (01/13/2024)   Social Connection and Isolation Panel    Frequency of Communication with Friends and Family: Once a week    Frequency of Social Gatherings with Friends and Family: Never    Attends Religious Services: Never    Database administrator or Organizations: No    Attends Banker Meetings: Never    Marital Status: Widowed    Tobacco Counseling Counseling given: Not Answered Tobacco comments: very light use when she did smoke    Clinical Intake:  Pre-visit preparation completed: Yes  Pain : No/denies pain     Nutritional Status: BMI 25 -29 Overweight Nutritional Risks: None Diabetes: No  No results found for: HGBA1C   How often do you need to have  someone help you when you read instructions, pamphlets, or other written materials from your doctor or pharmacy?: 1 - Never  Interpreter Needed?: No  Information entered by :: NAllen LPN   Activities of Daily Living     01/13/2024   10:13 AM  In your present state of health, do you have any difficulty performing the following activities:  Hearing? 0  Vision? 0  Difficulty concentrating or making decisions? 0  Walking or climbing stairs? 0  Dressing or bathing? 0  Doing errands, shopping? 0  Preparing Food and eating ? N  Using the Toilet? N  In the past six months, have you accidently leaked urine? N  Do you have problems with loss of bowel control? N  Managing your Medications? N  Managing your Finances? N  Housekeeping or managing your Housekeeping? N    Patient Care Team: Jarold Medici, MD as PCP - General (Internal Medicine) Ladona Heinz, MD as PCP - Cardiology (Cardiology) [facility]  I have updated your Care Teams any recent Medical Services you may have received from other providers in the past year.     Assessment:   This is a routine wellness examination for [place].  Hearing/Vision screen Hearing Screening - Comments:: Denies hearing issues Vision Screening - Comments:: Regular eye exams, [facility]   Goals Addressed             This Visit's Progress    Patient Stated       01/13/2024, denies goals       Depression Screen     01/13/2024   10:25 AM 12/08/2023    9:31 AM 01/01/2023    3:56 PM 12/03/2022    9:12 AM 05/26/2022   10:14 AM 12/25/2021   12:44 PM 08/06/2021    3:58 PM  PHQ 2/9 Scores  PHQ - 2 Score 1 0 0 4 0 5 0  PHQ- 9 Score 1 0 0 5  6     Fall Risk     01/13/2024   10:25 AM 12/08/2023    9:31 AM 07/14/2023   10:31 AM 01/01/2023    3:55 PM 12/03/2022    9:12 AM  Fall Risk   Falls in the past year? 0 0 0 0 0  Number falls in past yr: 0 0 0 0 0  Injury with Fall? 0 0 0 0 0  Risk for fall due to : No Fall Risks  No Fall Risks No Fall Risks Medication side effect No Fall Risks  Follow up Falls evaluation completed;Falls prevention discussed Falls evaluation completed Falls evaluation completed Falls prevention discussed;Falls evaluation completed Falls evaluation completed    MEDICARE RISK AT HOME:  Medicare Risk at Home Any stairs in or around the home?: Yes If so, are there any without handrails?: No Home free of loose throw rugs in walkways, pet beds, electrical cords, etc?: Yes Adequate lighting in your home to reduce risk of falls?: Yes Life alert?: No Use of a cane, walker or w/c?: No Grab bars in the bathroom?: No Shower chair or bench in shower?: No Elevated toilet seat or a handicapped toilet?: Yes  TIMED UP AND GO:  Was the test performed?  Yes  Length of time to ambulate 10 feet: 5 sec Gait slow and steady without use of assistive device  Cognitive Function: 6CIT completed        01/13/2024   10:27 AM 01/01/2023    3:59 PM 12/25/2021   12:56 PM 12/13/2020   10:17 AM 11/09/2019    2:27 PM  6CIT Screen  What Year? 0 points 0 points 0 points 0 points 0 points  What month? 0 points 0 points 0 points 0 points 0 points  What time? 0 points 0 points 0 points 0 points 0 points  Count back from 20 2 points 2 points 0 points 0 points 2 points  Months in reverse 4 points 4 points 4  points 4 points 4 points  Repeat phrase 8 points 2 points 2 points 0 points 2 points  Total Score 14 points 8 points 6 points 4 points 8 points    Immunizations Immunization History  Administered Date(s) Administered   INFLUENZA, HIGH DOSE SEASONAL PF 01/28/2018, 12/08/2023   Influenza Split 12/30/2014   Influenza,inj,Quad PF,6+ Mos 01/30/2016   Influenza-Unspecified 01/29/2013   PFIZER(Purple Top)SARS-COV-2 Vaccination 06/12/2019, 07/04/2019, 01/21/2020, 01/21/2021   PNEUMOCOCCAL CONJUGATE-20 11/21/2021   Pneumococcal Conjugate-13 05/09/2015    Screening Tests Health Maintenance  Topic Date Due    COVID-19 Vaccine (5 - 2025-26 season) 01/29/2024 (Originally 11/30/2023)   Zoster Vaccines- Shingrix (1 of 2) 03/08/2024 (Originally 11/19/1959)   Medicare Annual Wellness (AWV)  01/12/2025   Pneumococcal Vaccine: 50+ Years  Completed   Influenza Vaccine  Completed   DEXA SCAN  Completed   Meningococcal B Vaccine  Aged Out   DTaP/Tdap/Td  Discontinued   Colonoscopy  Discontinued   Hepatitis C Screening  Discontinued    Health Maintenance Items Addressed: Declines covid vaccine.  Additional Screening:  Vision Screening: Recommended annual ophthalmology exams for early detection of glaucoma and other disorders of the eye. Is the patient up to date with their annual eye exam?  Yes  Who is the provider or what is the name of the office in which the patient attends annual eye exams? [facility]  Dental Screening: Recommended annual dental exams for proper oral hygiene  Community Resource Referral / Chronic Care Management: CRR required this visit?  No   CCM required this visit?  No   Plan:    I have personally reviewed and noted the following in the patient's chart:   Medical and social history Use of alcohol, tobacco or illicit drugs  Current medications and supplements including opioid prescriptions. Patient is not currently taking opioid prescriptions. Functional ability and status Nutritional status Physical activity Advanced directives List of other physicians Hospitalizations, surgeries, and ER visits in previous 12 months Vitals Screenings to include cognitive, depression, and falls Referrals and appointments  In addition, I have reviewed and discussed with patient certain preventive protocols, quality metrics, and best practice recommendations. A written personalized care plan for preventive services as well as general preventive health recommendations were provided to patient.   Ardella FORBES Dawn, LPN   [id number]/84/7974   After Visit Summary: (In Person-Printed) AVS  printed and given to the patient  Notes: Nothing significant to report at this time.

## 2024-01-19 ENCOUNTER — Ambulatory Visit: Admitting: Gastroenterology

## 2024-01-20 ENCOUNTER — Other Ambulatory Visit: Payer: Self-pay | Admitting: Neurosurgery

## 2024-01-20 DIAGNOSIS — D329 Benign neoplasm of meninges, unspecified: Secondary | ICD-10-CM

## 2024-02-09 ENCOUNTER — Encounter: Payer: Self-pay | Admitting: Neurosurgery

## 2024-03-01 ENCOUNTER — Ambulatory Visit: Payer: Self-pay

## 2024-03-08 ENCOUNTER — Ambulatory Visit: Admitting: Gastroenterology

## 2024-03-08 ENCOUNTER — Ambulatory Visit: Payer: Self-pay

## 2024-03-15 ENCOUNTER — Other Ambulatory Visit: Payer: Self-pay | Admitting: Internal Medicine

## 2024-03-15 ENCOUNTER — Other Ambulatory Visit

## 2024-03-15 NOTE — Telephone Encounter (Unsigned)
 Copied from CRM [id number]. Topic: Clinical - Medication Refill >> Mar 15, 2024 10:29 AM Roselie BROCKS wrote: Medication: amLODipine  (NORVASC ) 5 MG tablet  Has the patient contacted their pharmacy? Yes (Agent: If no, request that the patient contact the pharmacy for the refill. If patient does not wish to contact the pharmacy document the reason why and proceed with request.) (Agent: If yes, when and what did the pharmacy advise?)  This is the patient's preferred pharmacy:  [facility] [address] ([address]), [place] - [address] [place] ([place]) [place] [address] Phone: [phone] Fax: [phone]    Is this the correct pharmacy for this prescription? Yes If no, delete pharmacy and type the correct one.   Has the prescription been filled recently? No  Is the patient out of the medication? Yes  Has the patient been seen for an appointment in the last year OR does the patient have an upcoming appointment? Yes  Can we respond through MyChart? Yes  Agent: Please be advised that Rx refills may take up to 3 business days. We ask that you follow-up with your pharmacy.

## 2024-03-17 MED ORDER — AMLODIPINE BESYLATE 5 MG PO TABS: 5.0000 mg | ORAL_TABLET | Freq: Every day | ORAL | 3 refills | Status: AC

## 2024-03-18 ENCOUNTER — Telehealth: Payer: Self-pay | Admitting: Pharmacy Technician

## 2024-03-18 NOTE — Telephone Encounter (Signed)
 Pharmacy Patient Advocate Encounter   Received notification from CoverMyMeds that prior authorization for adempas  is required/requested.   Insurance verification completed.   The patient is insured through [place].   Per test claim: PA required; PA submitted to above mentioned insurance via Latent Key/confirmation #/EOC [id number] Status is pending

## 2024-03-18 NOTE — Telephone Encounter (Signed)
 Pharmacy Patient Advocate Encounter  Received notification from [employer] that Prior Authorization for Adempas  has been APPROVED from 04/01/23 to 03/30/25   PA #/Case ID/Reference #: [id number]

## 2024-06-06 ENCOUNTER — Ambulatory Visit: Payer: Self-pay | Admitting: Internal Medicine

## 2024-12-12 ENCOUNTER — Encounter: Payer: Self-pay | Admitting: Internal Medicine

## 2025-01-13 ENCOUNTER — Ambulatory Visit: Payer: Self-pay
